# Patient Record
Sex: Female | Born: 1960
Health system: Southern US, Community
[De-identification: ages and names within clinical notes are randomized; demographics above are authoritative.]

## PROBLEM LIST (undated history)

## (undated) DIAGNOSIS — I10 Essential (primary) hypertension: Secondary | ICD-10-CM

## (undated) DIAGNOSIS — G4730 Sleep apnea, unspecified: Secondary | ICD-10-CM

## (undated) DIAGNOSIS — F329 Major depressive disorder, single episode, unspecified: Secondary | ICD-10-CM

## (undated) DIAGNOSIS — E119 Type 2 diabetes mellitus without complications: Secondary | ICD-10-CM

## (undated) DIAGNOSIS — F32A Depression, unspecified: Secondary | ICD-10-CM

## (undated) DIAGNOSIS — F419 Anxiety disorder, unspecified: Secondary | ICD-10-CM

## (undated) HISTORY — DX: Major depressive disorder, single episode, unspecified: F32.9

## (undated) HISTORY — DX: Type 2 diabetes mellitus without complications: E11.9

## (undated) HISTORY — DX: Essential (primary) hypertension: I10

## (undated) HISTORY — PX: KNEE SURGERY: SHX244

## (undated) HISTORY — DX: Anxiety disorder, unspecified: F41.9

## (undated) HISTORY — PX: ECTOPIC PREGNANCY SURGERY: SHX613

## (undated) HISTORY — DX: Depression, unspecified: F32.A

---

## 1994-07-02 HISTORY — PX: BACK SURGERY: SHX140

## 2007-07-03 HISTORY — PX: BREAST BIOPSY: SHX20

## 2011-05-21 LAB — HM COLONOSCOPY

## 2012-07-02 HISTORY — PX: ESOPHAGEAL DILATION: SHX303

## 2014-07-02 DIAGNOSIS — E1159 Type 2 diabetes mellitus with other circulatory complications: Secondary | ICD-10-CM | POA: Insufficient documentation

## 2014-07-02 DIAGNOSIS — I152 Hypertension secondary to endocrine disorders: Secondary | ICD-10-CM | POA: Insufficient documentation

## 2014-07-02 DIAGNOSIS — E119 Type 2 diabetes mellitus without complications: Secondary | ICD-10-CM

## 2014-07-02 DIAGNOSIS — I10 Essential (primary) hypertension: Secondary | ICD-10-CM

## 2014-07-02 HISTORY — DX: Essential (primary) hypertension: I10

## 2014-07-02 HISTORY — DX: Type 2 diabetes mellitus without complications: E11.9

## 2014-11-08 LAB — HM DIABETES EYE EXAM

## 2015-03-22 LAB — HM PAP SMEAR: HM Pap smear: NEGATIVE

## 2016-05-16 LAB — HM MAMMOGRAPHY

## 2016-06-07 LAB — TSH: TSH: 2.32 (ref <=5.90)

## 2017-02-28 ENCOUNTER — Encounter: Payer: Self-pay | Admitting: Family Medicine

## 2017-02-28 ENCOUNTER — Ambulatory Visit (INDEPENDENT_AMBULATORY_CARE_PROVIDER_SITE_OTHER): Payer: 59 | Admitting: Family Medicine

## 2017-02-28 VITALS — BP 122/78 | HR 72 | Temp 98.2°F | Resp 16 | Ht 67.5 in | Wt 246.0 lb

## 2017-02-28 DIAGNOSIS — E1142 Type 2 diabetes mellitus with diabetic polyneuropathy: Secondary | ICD-10-CM | POA: Diagnosis not present

## 2017-02-28 DIAGNOSIS — E669 Obesity, unspecified: Secondary | ICD-10-CM | POA: Diagnosis not present

## 2017-02-28 DIAGNOSIS — Z1159 Encounter for screening for other viral diseases: Secondary | ICD-10-CM

## 2017-02-28 DIAGNOSIS — F419 Anxiety disorder, unspecified: Secondary | ICD-10-CM | POA: Diagnosis not present

## 2017-02-28 DIAGNOSIS — F32A Depression, unspecified: Secondary | ICD-10-CM | POA: Insufficient documentation

## 2017-02-28 DIAGNOSIS — Z0000 Encounter for general adult medical examination without abnormal findings: Secondary | ICD-10-CM | POA: Diagnosis not present

## 2017-02-28 DIAGNOSIS — E049 Nontoxic goiter, unspecified: Secondary | ICD-10-CM | POA: Diagnosis not present

## 2017-02-28 DIAGNOSIS — Z114 Encounter for screening for human immunodeficiency virus [HIV]: Secondary | ICD-10-CM

## 2017-02-28 DIAGNOSIS — F332 Major depressive disorder, recurrent severe without psychotic features: Secondary | ICD-10-CM | POA: Insufficient documentation

## 2017-02-28 DIAGNOSIS — Z124 Encounter for screening for malignant neoplasm of cervix: Secondary | ICD-10-CM

## 2017-02-28 DIAGNOSIS — F339 Major depressive disorder, recurrent, unspecified: Secondary | ICD-10-CM | POA: Diagnosis not present

## 2017-02-28 DIAGNOSIS — F329 Major depressive disorder, single episode, unspecified: Secondary | ICD-10-CM | POA: Insufficient documentation

## 2017-02-28 DIAGNOSIS — Z13228 Encounter for screening for other metabolic disorders: Secondary | ICD-10-CM | POA: Insufficient documentation

## 2017-02-28 DIAGNOSIS — E041 Nontoxic single thyroid nodule: Secondary | ICD-10-CM | POA: Insufficient documentation

## 2017-02-28 DIAGNOSIS — G4733 Obstructive sleep apnea (adult) (pediatric): Secondary | ICD-10-CM

## 2017-02-28 DIAGNOSIS — Z23 Encounter for immunization: Secondary | ICD-10-CM | POA: Diagnosis not present

## 2017-02-28 DIAGNOSIS — Z6837 Body mass index (BMI) 37.0-37.9, adult: Secondary | ICD-10-CM

## 2017-02-28 DIAGNOSIS — I10 Essential (primary) hypertension: Secondary | ICD-10-CM

## 2017-02-28 DIAGNOSIS — F411 Generalized anxiety disorder: Secondary | ICD-10-CM | POA: Insufficient documentation

## 2017-02-28 MED ORDER — ESCITALOPRAM OXALATE 5 MG PO TABS: 5.0000 mg | ORAL_TABLET | Freq: Every day | ORAL | 1 refills | Status: DC

## 2017-02-28 MED ORDER — VENLAFAXINE HCL 50 MG PO TABS: 50.0000 mg | ORAL_TABLET | Freq: Two times a day (BID) | ORAL | 0 refills | Status: DC

## 2017-02-28 NOTE — Assessment & Plan Note (Signed)
Requesting records of mammogram and colonoscopy Pap smear collected today Pneumovax and flu shots today Screen Hep C and HIV Screening CBC Next CPE in 1 yr

## 2017-02-28 NOTE — Assessment & Plan Note (Signed)
Seemingly unctrolled on Effexor currently with some depressive anhedonia Offered increased dose vs switching back to lexapro that had helped in the past Patient prefers lexapro Will cross taper (instructions in AVS) Advised on return precautions including signs of serotonin syndrome F/u in 1 month - repeat GAD7 and PHQ9

## 2017-02-28 NOTE — Assessment & Plan Note (Signed)
Discussed healthy diet and exercise Screening lipid panel

## 2017-02-28 NOTE — Assessment & Plan Note (Signed)
See plan for anxiety below

## 2017-02-28 NOTE — Patient Instructions (Signed)
Decrease Effexor to '50mg'$  daily x1 week and then 25 mg daily for 2 weeks and then stop.  Start lexapro '5mg'$  for 1 week and then increase to 10 mg.  We will follow-up in 1 month and consider increasing dose further.   Preventive Care 40-64 Years, Female Preventive care refers to lifestyle choices and visits with your health care provider that can promote health and wellness. What does preventive care include?  A yearly physical exam. This is also called an annual well check.  Dental exams once or twice a year.  Routine eye exams. Ask your health care provider how often you should have your eyes checked.  Personal lifestyle choices, including: ? Daily care of your teeth and gums. ? Regular physical activity. ? Eating a healthy diet. ? Avoiding tobacco and drug use. ? Limiting alcohol use. ? Practicing safe sex. ? Taking low-dose aspirin daily starting at age 19. ? Taking vitamin and mineral supplements as recommended by your health care provider. What happens during an annual well check? The services and screenings done by your health care provider during your annual well check will depend on your age, overall health, lifestyle risk factors, and family history of disease. Counseling Your health care provider may ask you questions about your:  Alcohol use.  Tobacco use.  Drug use.  Emotional well-being.  Home and relationship well-being.  Sexual activity.  Eating habits.  Work and work Statistician.  Method of birth control.  Menstrual cycle.  Pregnancy history.  Screening You may have the following tests or measurements:  Height, weight, and BMI.  Blood pressure.  Lipid and cholesterol levels. These may be checked every 5 years, or more frequently if you are over 17 years old.  Skin check.  Lung cancer screening. You may have this screening every year starting at age 89 if you have a 30-pack-year history of smoking and currently smoke or have quit within the  past 15 years.  Fecal occult blood test (FOBT) of the stool. You may have this test every year starting at age 70.  Flexible sigmoidoscopy or colonoscopy. You may have a sigmoidoscopy every 5 years or a colonoscopy every 10 years starting at age 21.  Hepatitis C blood test.  Hepatitis B blood test.  Sexually transmitted disease (STD) testing.  Diabetes screening. This is done by checking your blood sugar (glucose) after you have not eaten for a while (fasting). You may have this done every 1-3 years.  Mammogram. This may be done every 1-2 years. Talk to your health care provider about when you should start having regular mammograms. This may depend on whether you have a family history of breast cancer.  BRCA-related cancer screening. This may be done if you have a family history of breast, ovarian, tubal, or peritoneal cancers.  Pelvic exam and Pap test. This may be done every 3 years starting at age 2. Starting at age 16, this may be done every 5 years if you have a Pap test in combination with an HPV test.  Bone density scan. This is done to screen for osteoporosis. You may have this scan if you are at high risk for osteoporosis.  Discuss your test results, treatment options, and if necessary, the need for more tests with your health care provider. Vaccines Your health care provider may recommend certain vaccines, such as:  Influenza vaccine. This is recommended every year.  Tetanus, diphtheria, and acellular pertussis (Tdap, Td) vaccine. You may need a Td booster every 10  years.  Varicella vaccine. You may need this if you have not been vaccinated.  Zoster vaccine. You may need this after age 66.  Measles, mumps, and rubella (MMR) vaccine. You may need at least one dose of MMR if you were born in 1957 or later. You may also need a second dose.  Pneumococcal 13-valent conjugate (PCV13) vaccine. You may need this if you have certain conditions and were not previously  vaccinated.  Pneumococcal polysaccharide (PPSV23) vaccine. You may need one or two doses if you smoke cigarettes or if you have certain conditions.  Meningococcal vaccine. You may need this if you have certain conditions.  Hepatitis A vaccine. You may need this if you have certain conditions or if you travel or work in places where you may be exposed to hepatitis A.  Hepatitis B vaccine. You may need this if you have certain conditions or if you travel or work in places where you may be exposed to hepatitis B.  Haemophilus influenzae type b (Hib) vaccine. You may need this if you have certain conditions.  Talk to your health care provider about which screenings and vaccines you need and how often you need them. This information is not intended to replace advice given to you by your health care provider. Make sure you discuss any questions you have with your health care provider. Document Released: 07/15/2015 Document Revised: 03/07/2016 Document Reviewed: 04/19/2015 Elsevier Interactive Patient Education  2017 [employer].

## 2017-02-28 NOTE — Assessment & Plan Note (Signed)
Continue CPAP.  

## 2017-02-28 NOTE — Progress Notes (Signed)
Patient: Angelica Medina, Female    DOB: May 02, 1961, 56 y.o.   MRN: [id number] Visit Date: 02/28/2017  Today's Provider: Lavon Paganini, MD   Chief Complaint  Patient presents with  . Establish Care  . Annual Exam   Subjective:  Angelica Medina presents to establish care. She just from [place]. She has a H/O "diet controlled" diabetes and HTN. She is taking Lisinopril 5 mg, as well as Effexor 75 mg BID for anxiety and depression. She is also using a CPAP for sleep apnea and has good control of symptoms.  T2DM with neuropathy:  Unknown last A1c - last eye exam in 03/2016 in [place] - taking b complex vitamins for neuropathy - performs nightly foot exam - low carb diet - no regular exercise  HTN: no SEs, taking lisinopril, no cough, no CP, SOB, headaches, vision changes  Anxiety/Depression: previosuly tried Lexapro (thought she was well controlled on this and not sure why she was switched to Effexor), wellbutrin (increased anxiety too much), paxil (fatigue and drowsiness) - a lot of family members also struggle with anxiety - feels as though she does not get pleasure from things she used to enjoy - no SI/HI  Enlarged thyroid: Previously had [place] that was normal - was getting TSH q6 months to monitor - not taking any medications    Annual physical exam Angelica Medina is a 56 y.o. female who presents today for health maintenance and complete physical. She feels well. She reports she is not currently exercising. She reports she is sleeping well.  Pt states her last mammogram was in February.  Previously had normal breast biopsy She agrees to get a pap today. Thinks it was mildly abnormal many years ago, no colpo or biopsy done She states she is UTD on colonoscopy. Never abnormal, thinks last was 3 yrs ago  -----------------------------------------------------------------   Review of Systems  Constitutional: Negative.   HENT: Positive for trouble swallowing. Negative for congestion, dental  problem, drooling, ear discharge, ear pain, facial swelling, hearing loss, mouth sores, nosebleeds, postnasal drip, rhinorrhea, sinus pain, sinus pressure, sneezing, sore throat, tinnitus and voice change.   Eyes: Negative.   Respiratory: Positive for apnea. Negative for cough, choking, chest tightness, shortness of breath, wheezing and stridor.   Cardiovascular: Negative.   Gastrointestinal: Negative.   Endocrine: Negative.   Genitourinary: Positive for dyspareunia and frequency. Negative for decreased urine volume, difficulty urinating, dysuria, enuresis, flank pain, genital sores, hematuria, menstrual problem, pelvic pain, urgency, vaginal bleeding, vaginal discharge and vaginal pain.  Musculoskeletal: Positive for back pain. Negative for arthralgias, gait problem, joint swelling, myalgias, neck pain and neck stiffness.  Skin: Negative.   Allergic/Immunologic: Negative.   Neurological: Negative.   Hematological: Negative.   Psychiatric/Behavioral: Negative for agitation, behavioral problems, confusion, decreased concentration, dysphoric mood, hallucinations, self-injury, sleep disturbance and suicidal ideas. The patient is nervous/anxious. The patient is not hyperactive.     Social History      She  reports that she quit smoking about 22 years ago. She has a 10.00 pack-year smoking history. She has never used smokeless tobacco. She reports that she does not drink alcohol or use drugs.       Social History   Social History  . Marital status: Married    Spouse name: [name]  . Number of children: 3  . Years of education: 12   Occupational History  . 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 Smoking status: Former Smoker  Packs/day: 0.50    Years: 20.00    Quit date: 07/01/1994  . Smokeless tobacco: Never Used  . Alcohol use No  . Drug use: No  . Sexual activity: Yes   Other Topics Concern  . None   Social History Narrative  . None    Past Medical  History:  Diagnosis Date  . Anxiety   . Depression   . Hypertension 2016  . T2DM (type 2 diabetes mellitus) ([place]) 2016   diet controlled     Patient Active Problem List   Diagnosis Date Noted  . OSA (obstructive sleep apnea) 02/28/2017  . Obesity 02/28/2017  . Enlarged thyroid 02/28/2017  . Healthcare maintenance 02/28/2017  . Depression   . Anxiety   . T2DM (type 2 diabetes mellitus) ([place]) 07/02/2014  . Hypertension 07/02/2014    Past Surgical History:  Procedure Laterality Date  . [place]   for herniated disc, no hardware  . ECTOPIC PREGNANCY SURGERY    . ESOPHAGEAL DILATION  2014   has had several i nthe past for achalasia    Family History        Family Status  Relation Status  . Mother Alive  . Father Deceased  . Sister Alive  . Brother Alive  . Brother Deceased       accidental  . Sister Alive        Her family history includes Anxiety disorder in her sister; Depression in her father and sister; Diabetes in her father; Healthy in her brother; Hypothyroidism in her sister; Thyroid cancer in her mother.     Allergies  Allergen Reactions  . Wellbutrin [Bupropion] Anxiety     Current Outpatient Prescriptions:  .  b complex vitamins capsule, Take 1 capsule by mouth daily., Disp: , Rfl:  .  lisinopril (PRINIVIL,ZESTRIL) 5 MG tablet, Take 5 mg by mouth daily., Disp: , Rfl:  .  Multiple Vitamin (MULTIVITAMIN) tablet, Take 1 tablet by mouth daily., Disp: , Rfl:  .  Omega-3 1000 MG CAPS, Take 1 tablet by mouth daily., Disp: , Rfl:  .  venlafaxine (EFFEXOR) 50 MG tablet, Take 1 tablet (50 mg total) by mouth 2 (two) times daily with a meal. Taper as directed, Disp: 60 tablet, Rfl: 0 .  escitalopram (LEXAPRO) 5 MG tablet, Take 1 tablet (5 mg total) by mouth daily. X1wk, then 10mg  daily, Disp: 60 tablet, Rfl: 1   Patient Care Team: Virginia Crews, MD as PCP - General (Family Medicine)      Objective:   Vitals: BP 122/78 (BP Location: Left Arm,  Patient Position: Sitting, Cuff Size: Large)   Pulse 72   Temp 98.2 F (36.8 C) (Oral)   Resp 16   Ht 5' 7.5" (1.715 m)   Wt 246 lb (111.6 kg)   BMI 37.96 kg/m    Vitals:   02/28/17 1017  BP: 122/78  Pulse: 72  Resp: 16  Temp: 98.2 F (36.8 C)  TempSrc: Oral  Weight: 246 lb (111.6 kg)  Height: 5' 7.5" (1.715 m)     Physical Exam  Constitutional: She is oriented to person, place, and time. She appears well-developed and well-nourished. No distress.  HENT:  Head: Normocephalic and atraumatic.  Right Ear: Tympanic membrane and external ear normal.  Left Ear: Tympanic membrane and external ear normal.  Nose: Nose normal.  Mouth/Throat: Oropharynx is clear and moist.  Eyes: Pupils are equal, round, and reactive to light. Conjunctivae and EOM are normal. No scleral  icterus.  Neck: Neck supple. Thyromegaly present.  Cardiovascular: Normal rate, regular rhythm, normal heart sounds and intact distal pulses.   No murmur heard. Pulmonary/Chest: Effort normal and breath sounds normal. No respiratory distress. She has no wheezes. She has no rales.  Abdominal: Soft. Bowel sounds are normal. She exhibits no distension. There is no tenderness. There is no rebound and no guarding.  Genitourinary:  Genitourinary Comments: GYN:  External genitalia within normal limits.  Vaginal mucosa pink, moist, normal rugae.  Nonfriable cervix without lesions, no discharge or bleeding noted on speculum exam.  Bimanual exam revealed normal, nongravid uterus.  No cervical motion tenderness. No adnexal masses bilaterally.    Musculoskeletal: She exhibits no edema or deformity.  Lymphadenopathy:    She has no cervical adenopathy.  Neurological: She is alert and oriented to person, place, and time. No cranial nerve deficit.  Skin: Skin is warm and dry. No rash noted.  Psychiatric: She has a normal mood and affect. Her behavior is normal.  Vitals reviewed.   Diabetic Foot Exam - Simple   Simple Foot  Form Diabetic Foot exam was performed with the following findings:  Yes 02/28/2017 11:39 AM  Visual Inspection No deformities, no ulcerations, no other skin breakdown bilaterally:  Yes Sensation Testing See comments:  Yes Pulse Check Posterior Tibialis and Dorsalis pulse intact bilaterally:  Yes Comments Large calluses on balls of feet b/l Decreased sensation over callused areas     Depression Screen PHQ 2/9 Scores 02/28/2017 02/28/2017  PHQ - 2 Score 2 2  PHQ- 9 Score 4 -   GAD 7 : Generalized Anxiety Score 02/28/2017  Nervous, Anxious, on Edge 1  Control/stop worrying 1  Worry too much - different things 1  Trouble relaxing 1  Restless 0  Easily annoyed or irritable 1  Afraid - awful might happen 1  Total GAD 7 Score 6  Anxiety Difficulty Somewhat difficult     Assessment & Plan:     Routine Health Maintenance and Physical Exam  Exercise Activities and Dietary recommendations Goals    None      Immunization History  Administered Date(s) Administered  . Influenza,inj,Quad PF,6+ Mos 02/28/2017  . Pneumococcal Polysaccharide-23 02/28/2017    Health Maintenance  Topic Date Due  . HEMOGLOBIN A1C  17-Nov-1960  . Hepatitis C Screening  11-Dec-1960  . PNEUMOCOCCAL POLYSACCHARIDE VACCINE (1) 08/18/1962  . FOOT EXAM  08/18/1970  . OPHTHALMOLOGY EXAM  08/18/1970  . HIV Screening  08/19/1975  . TETANUS/TDAP  08/19/1979  . PAP SMEAR  08/18/1981  . MAMMOGRAM  08/18/2010  . COLONOSCOPY  08/18/2010  . INFLUENZA VACCINE  01/30/2017     Discussed health benefits of physical activity, and encouraged her to engage in regular exercise appropriate for her age and condition.   Will request records from previous PCP  Healthcare maintenance Requesting records of mammogram and colonoscopy Pap smear collected today Pneumovax and flu shots today Screen Hep C and HIV Screening CBC Next CPE in 1 yr  Obesity Discussed healthy diet and exercise Screening lipid  panel  Enlarged thyroid Recheck TSH  T2DM (type 2 diabetes mellitus) ([place]) Recheck A1c Foot exam today Referral to optho for eye exam Vaccines as above  OSA (obstructive sleep apnea) Continue CPAP  Hypertension Well controlled Continue lisinopril Check CMP F/u in 3-6 months  Depression See plan for anxiety below  Anxiety Seemingly unctrolled on Effexor currently with some depressive anhedonia Offered increased dose vs switching back to lexapro that had helped in  the past Patient prefers lexapro Will cross taper (instructions in AVS) Advised on return precautions including signs of serotonin syndrome F/u in 1 month - repeat GAD7 and PHQ9    --------------------------------------------------------------------  The entirety of the information documented in the History of Present Illness, Review of Systems and Physical Exam were personally obtained by me. Portions of this information were initially documented by Raquel Sarna Ratchford, CMA and reviewed by me for thoroughness and accuracy.    Lavon Paganini, MD  [facility]

## 2017-02-28 NOTE — Assessment & Plan Note (Signed)
Well controlled Continue lisinopril Check CMP F/u in 3-6 months

## 2017-02-28 NOTE — Assessment & Plan Note (Signed)
Recheck TSH 

## 2017-02-28 NOTE — Assessment & Plan Note (Signed)
Recheck A1c Foot exam today Referral to optho for eye exam Vaccines as above

## 2017-03-01 ENCOUNTER — Telehealth: Payer: Self-pay

## 2017-03-01 DIAGNOSIS — E7800 Pure hypercholesterolemia, unspecified: Secondary | ICD-10-CM

## 2017-03-01 LAB — COMPREHENSIVE METABOLIC PANEL
ALT: 38 IU/L — ABNORMAL HIGH (ref 0–32)
AST: 32 IU/L (ref 0–40)
Albumin/Globulin Ratio: 1.3 (ref 1.2–2.2)
Albumin: 4.7 g/dL (ref 3.5–5.5)
Alkaline Phosphatase: 72 IU/L (ref 39–117)
BUN/Creatinine Ratio: 15 (ref 9–23)
BUN: 10 mg/dL (ref 6–24)
Bilirubin Total: 0.3 mg/dL (ref 0.0–1.2)
CO2: 24 mmol/L (ref 20–29)
Calcium: 10.3 mg/dL — ABNORMAL HIGH (ref 8.7–10.2)
Chloride: 97 mmol/L (ref 96–106)
Creatinine, Ser: 0.66 mg/dL (ref 0.57–1.00)
GFR calc Af Amer: 114 mL/min/{1.73_m2} (ref >59)
GFR calc non Af Amer: 99 mL/min/{1.73_m2} (ref >59)
Globulin, Total: 3.6 g/dL (ref 1.5–4.5)
Glucose: 103 mg/dL — ABNORMAL HIGH (ref 65–99)
Potassium: 5.1 mmol/L (ref 3.5–5.2)
Sodium: 137 mmol/L (ref 134–144)
Total Protein: 8.3 g/dL (ref 6.0–8.5)

## 2017-03-01 LAB — CBC WITH DIFFERENTIAL/PLATELET
Basophils Absolute: 0 10*3/uL (ref 0.0–0.2)
Basos: 0 %
EOS (ABSOLUTE): 0.2 10*3/uL (ref 0.0–0.4)
Eos: 2 %
Hematocrit: 40.6 % (ref 34.0–46.6)
Hemoglobin: 13.3 g/dL (ref 11.1–15.9)
Immature Grans (Abs): 0 10*3/uL (ref 0.0–0.1)
Immature Granulocytes: 0 %
Lymphocytes Absolute: 3.1 10*3/uL (ref 0.7–3.1)
Lymphs: 36 %
MCH: 28.9 pg (ref 26.6–33.0)
MCHC: 32.8 g/dL (ref 31.5–35.7)
MCV: 88 fL (ref 79–97)
Monocytes Absolute: 0.4 10*3/uL (ref 0.1–0.9)
Monocytes: 5 %
Neutrophils Absolute: 4.9 10*3/uL (ref 1.4–7.0)
Neutrophils: 57 %
Platelets: 394 10*3/uL — ABNORMAL HIGH (ref 150–379)
RBC: 4.61 x10E6/uL (ref 3.77–5.28)
RDW: 13.6 % (ref 12.3–15.4)
WBC: 8.6 10*3/uL (ref 3.4–10.8)

## 2017-03-01 LAB — HEPATITIS C ANTIBODY: Hep C Virus Ab: <0.1 s/co ratio (ref 0.0–0.9)

## 2017-03-01 LAB — LIPID PANEL
Chol/HDL Ratio: 4.6 ratio — ABNORMAL HIGH (ref 0.0–4.4)
Cholesterol, Total: 219 mg/dL — ABNORMAL HIGH (ref 100–199)
HDL: 48 mg/dL (ref >39)
LDL Calculated: 140 mg/dL — ABNORMAL HIGH (ref 0–99)
Triglycerides: 156 mg/dL — ABNORMAL HIGH (ref 0–149)
VLDL Cholesterol Cal: 31 mg/dL (ref 5–40)

## 2017-03-01 LAB — HEMOGLOBIN A1C
Est. average glucose Bld gHb Est-mCnc: 148 mg/dL
Hgb A1c MFr Bld: 6.8 % — ABNORMAL HIGH (ref 4.8–5.6)

## 2017-03-01 LAB — TSH: TSH: 1.42 u[IU]/mL (ref 0.450–4.500)

## 2017-03-01 LAB — HIV ANTIBODY (ROUTINE TESTING W REFLEX): HIV Screen 4th Generation wRfx: NONREACTIVE

## 2017-03-01 NOTE — Telephone Encounter (Signed)
lmtcb

## 2017-03-01 NOTE — Telephone Encounter (Signed)
-----   Message from Virginia Crews, MD sent at 03/01/2017  8:31 AM EDT ----- Normal Thyroid function, blood counts (platelets run just slightly above normal range, nothing to worry about), kidney function, liver function, electrolytes (calcium is slightly elevated - 10.3, 10.2 is normal, could decrease any calcium supplement you are taking).  Cholesterol is high.  10 year risk of heart disease/stroke is 6.1%.  I would recommend starting a low dose statin medicatio nto lower cholesterol (Lipitor 20mg  daily). Will wait to hear back from the patient on whether or not she'd like to do this.  A1c 6.8, which is below 7 which is goal.  Negative HIV and Hep C screening.  Virginia Crews, MD, MPH [facility] 03/01/2017 8:31 AM

## 2017-03-02 LAB — PAP IG AND HPV HIGH-RISK
HPV, high-risk: NEGATIVE
PAP Smear Comment: 0

## 2017-03-05 MED ORDER — ATORVASTATIN CALCIUM 20 MG PO TABS: 20.0000 mg | ORAL_TABLET | Freq: Every day | ORAL | 2 refills | Status: DC

## 2017-03-05 NOTE — Telephone Encounter (Signed)
Pt advised of lab/pap results. She agrees to start Lipitor. [place]. How many refills should I send in?

## 2017-03-05 NOTE — Telephone Encounter (Signed)
Sent in

## 2017-03-05 NOTE — Telephone Encounter (Signed)
Lipitor 20 mg daily #90 R-2

## 2017-03-05 NOTE — Telephone Encounter (Signed)
-----   Message from Virginia Crews, MD sent at 03/05/2017  8:12 AM EDT ----- Normal pap smear.  Repeat in 5 years.  Virginia Crews, MD, MPH [facility] 03/05/2017 8:12 AM

## 2017-03-22 ENCOUNTER — Encounter: Payer: Self-pay | Admitting: Family Medicine

## 2017-03-22 NOTE — Progress Notes (Signed)
Received records from former PCP office.  Review reveals the following:  - 06/2016 found to have small benign nodule of left lobe of thyroid gland - recommended repeat US in 1 yr  - seems patient was weaned from [place] and started on Effexor for worsening depressive symptoms - had been on Effexor prior to Lexapro  - Pap smear from 08/2014 normal, no HPV testing  - Most recent diabetic eye exam in 10/2014 with no retinopathy  - overnight oximetry in 2013 with # desat events per hour low at 4.6 - no CPAP or O2 recommended - then patient was started on CPAP with improvements in symptoms  - diagnosed in early 2010s with dysphagia likely 2/2 achlasia - has had multiple esophageal dilations for symptom control  Vital records scanned into chart.  Virginia Crews, MD, MPH [facility] 03/22/2017 3:53 PM

## 2017-03-27 ENCOUNTER — Encounter: Payer: Self-pay | Admitting: Family Medicine

## 2017-03-29 ENCOUNTER — Ambulatory Visit (INDEPENDENT_AMBULATORY_CARE_PROVIDER_SITE_OTHER): Payer: 59 | Admitting: Family Medicine

## 2017-03-29 ENCOUNTER — Encounter: Payer: Self-pay | Admitting: Family Medicine

## 2017-03-29 VITALS — BP 132/78 | HR 76 | Temp 98.2°F | Resp 16 | Wt 246.0 lb

## 2017-03-29 DIAGNOSIS — R319 Hematuria, unspecified: Secondary | ICD-10-CM | POA: Diagnosis not present

## 2017-03-29 DIAGNOSIS — F331 Major depressive disorder, recurrent, moderate: Secondary | ICD-10-CM

## 2017-03-29 DIAGNOSIS — R350 Frequency of micturition: Secondary | ICD-10-CM | POA: Diagnosis not present

## 2017-03-29 DIAGNOSIS — F419 Anxiety disorder, unspecified: Secondary | ICD-10-CM

## 2017-03-29 LAB — POCT URINALYSIS DIPSTICK
Bilirubin, UA: NEGATIVE
Glucose, UA: NEGATIVE
Ketones, UA: NEGATIVE
Leukocytes, UA: NEGATIVE
Nitrite, UA: NEGATIVE
Protein, UA: NEGATIVE
Spec Grav, UA: 1.025 (ref 1.010–1.025)
Urobilinogen, UA: 0.2 E.U./dL
pH, UA: 6 (ref 5.0–8.0)

## 2017-03-29 MED ORDER — ESCITALOPRAM OXALATE 20 MG PO TABS: 20.0000 mg | ORAL_TABLET | Freq: Every day | ORAL | 3 refills | Status: DC

## 2017-03-29 NOTE — Patient Instructions (Signed)

## 2017-03-29 NOTE — Assessment & Plan Note (Signed)
Increase lexapro to 20 mg daily Improving but not well controlled Off of Effexor F/u in 3 months - repeat GAD7 and PHQ9

## 2017-03-29 NOTE — Assessment & Plan Note (Signed)
Improving, but not well controlled Increase lexapro dose to 20mg  daily No SI/HI F/u in 3 months Consider addition of therapy

## 2017-03-29 NOTE — Progress Notes (Signed)
Patient: Angelica Medina Female    DOB: 21-Nov-1960   56 y.o.   MRN: [id number] Visit Date: 03/29/2017  Today's Provider: Lavon Paganini, MD   Chief Complaint  Patient presents with  . Anxiety  . Urinary Tract Infection   Subjective:    Anxiety  Presents for follow-up (LOV 02/28/2017; pt D/C Effexor and started Lexapro 10 mg) visit. Symptoms include irritability, nervous/anxious behavior and restlessness. Patient reports no chest pain, compulsions, confusion, decreased concentration, depressed mood (improved on Lexapro), dizziness, dry mouth, excessive worry, feeling of choking, hyperventilation, impotence, insomnia, malaise, muscle tension, nausea, obsessions, palpitations, panic, shortness of breath or suicidal ideas. The severity of symptoms is moderate. The quality of sleep is fair.   Compliance with medications: good compliance. Treatment side effects: no side effects.  Urinary Tract Infection   This is a new problem. Episode onset: x 5 days after driving home from [place], without stopping enough times to urinate. The problem has been unchanged. The patient is experiencing no pain ("just pressure"). Associated symptoms include frequency and urgency. Pertinent negatives include no chills, discharge, flank pain, hematuria, hesitancy, nausea, sweats or vomiting. She has tried NSAIDs for the symptoms. The treatment provided mild relief.      Allergies  Allergen Reactions  . Wellbutrin [Bupropion] Anxiety     Current Outpatient Prescriptions:  .  atorvastatin (LIPITOR) 20 MG tablet, Take 1 tablet (20 mg total) by mouth daily., Disp: 90 tablet, Rfl: 2 .  b complex vitamins capsule, Take 1 capsule by mouth daily., Disp: , Rfl:  .  escitalopram (LEXAPRO) 20 MG tablet, Take 1 tablet (20 mg total) by mouth daily., Disp: 90 tablet, Rfl: 3 .  lisinopril (PRINIVIL,ZESTRIL) 5 MG tablet, Take 5 mg by mouth daily., Disp: , Rfl:  .  Multiple Vitamin (MULTIVITAMIN) tablet, Take 1 tablet by mouth  daily., Disp: , Rfl:  .  Omega-3 1000 MG CAPS, Take 1 tablet by mouth daily., Disp: , Rfl:   Review of Systems  Constitutional: Positive for irritability. Negative for chills.  Respiratory: Negative for shortness of breath.   Cardiovascular: Negative for chest pain and palpitations.  Gastrointestinal: Negative for nausea and vomiting.  Genitourinary: Positive for frequency and urgency. Negative for flank pain, hematuria, hesitancy and impotence.  Neurological: Negative for dizziness.  Psychiatric/Behavioral: Negative for confusion, decreased concentration and suicidal ideas. The patient is nervous/anxious. The patient does not have insomnia.     Social History  Substance Use Topics  . Smoking status: Former Smoker    Packs/day: 0.50    Years: 20.00    Quit date: 07/01/1994  . Smokeless tobacco: Never Used  . Alcohol use No   Objective:   BP 132/78 (BP Location: Left Arm, Patient Position: Sitting, Cuff Size: Large)   Pulse 76   Temp 98.2 F (36.8 C) (Oral)   Resp 16   Wt 246 lb (111.6 kg)   BMI 37.96 kg/m  Vitals:   03/29/17 0836  BP: 132/78  Pulse: 76  Resp: 16  Temp: 98.2 F (36.8 C)  TempSrc: Oral  Weight: 246 lb (111.6 kg)     Physical Exam  Constitutional: She is oriented to person, place, and time. She appears well-developed and well-nourished. No distress.  HENT:  Head: Normocephalic and atraumatic.  Eyes: Conjunctivae are normal. No scleral icterus.  Cardiovascular: Normal rate, regular rhythm, normal heart sounds and intact distal pulses.   No murmur heard. Pulmonary/Chest: Effort normal and breath sounds normal. No respiratory distress. She has  no wheezes. She has no rales.  Abdominal: Soft. Bowel sounds are normal. She exhibits no distension. There is no tenderness.  Musculoskeletal: She exhibits no edema.  Neurological: She is alert and oriented to person, place, and time.  Skin: Skin is warm and dry. No rash noted.  Psychiatric: She has a normal  mood and affect. Her behavior is normal.  Vitals reviewed.  Depression screen [facility] 2/9 03/29/2017 02/28/2017 02/28/2017  Decreased Interest 1 1 1   Down, Depressed, Hopeless 0 1 1  PHQ - 2 Score 1 2 2   Altered sleeping 1 0 -  Tired, decreased energy 1 1 -  Change in appetite 2 1 -  Feeling bad or failure about yourself  0 - -  Trouble concentrating 0 0 -  Moving slowly or fidgety/restless 0 0 -  Suicidal thoughts 0 0 -  PHQ-9 Score 5 4 -  Difficult doing work/chores Not difficult at all Not difficult at all -   GAD 7 : Generalized Anxiety Score 03/29/2017 02/28/2017  Nervous, Anxious, on Edge 3 1  Control/stop worrying 0 1  Worry too much - different things 0 1  Trouble relaxing 2 1  Restless 1 0  Easily annoyed or irritable 2 1  Afraid - awful might happen 0 1  Total GAD 7 Score 8 6  Anxiety Difficulty - Somewhat difficult    Results for orders placed or performed in visit on 03/29/17  POCT urinalysis dipstick  Result Value Ref Range   Color, UA dark yellow    Clarity, UA clear    Glucose, UA Negative    Bilirubin, UA Negative    Ketones, UA Negative    Spec Grav, UA 1.025 1.010 - 1.025   Blood, UA Hemolyzed Moderate    pH, UA 6.0 5.0 - 8.0   Protein, UA Negative    Urobilinogen, UA 0.2 0.2 or 1.0 E.U./dL   Nitrite, UA Negative    Leukocytes, UA Negative Negative        Assessment & Plan:      Problem List Items Addressed This Visit      Other   Depression    Improving, but not well controlled Increase lexapro dose to 20mg  daily No SI/HI F/u in 3 months Consider addition of therapy      Relevant Medications   escitalopram (LEXAPRO) 20 MG tablet   Anxiety    Increase lexapro to 20 mg daily Improving but not well controlled Off of Effexor F/u in 3 months - repeat GAD7 and PHQ9      Relevant Medications   escitalopram (LEXAPRO) 20 MG tablet   Hematuria - Primary    UA reveals mod blood with neg leuks/nitrites Suspect dysuria may be related to  dehydration and bladder distension from long trip recently Will send urine for culture and micro No antibiotics currently but pending UCx results Patient reports previous hematuria evaluated by urologist with benign findings - not found in previous PCP records May need urology referral pending micro results - would first recheck in ~6 weeks to see that this is persistent      Relevant Orders   Urine Culture   Urine Microscopic    Other Visit Diagnoses    Urinary frequency       Relevant Orders   POCT urinalysis dipstick (Completed)          The entirety of the information documented in the History of Present Illness, Review of Systems and Physical Exam were personally obtained by  me. Portions of this information were initially documented by Raquel Sarna Ratchford, CMA and reviewed by me for thoroughness and accuracy.     Lavon Paganini, MD  [facility]

## 2017-03-29 NOTE — Assessment & Plan Note (Signed)
UA reveals mod blood with neg leuks/nitrites Suspect dysuria may be related to dehydration and bladder distension from long trip recently Will send urine for culture and micro No antibiotics currently but pending UCx results Patient reports previous hematuria evaluated by urologist with benign findings - not found in previous PCP records May need urology referral pending micro results - would first recheck in ~6 weeks to see that this is persistent

## 2017-03-30 LAB — URINALYSIS, MICROSCOPIC ONLY
Bacteria, UA: NONE SEEN /HPF
Hyaline Cast: NONE SEEN /LPF

## 2017-04-01 ENCOUNTER — Telehealth: Payer: Self-pay

## 2017-04-01 LAB — URINE CULTURE
MICRO NUMBER:: 81079366
SPECIMEN QUALITY:: ADEQUATE

## 2017-04-01 NOTE — Telephone Encounter (Signed)
-----   Message from Virginia Crews, MD sent at 04/01/2017  8:25 AM EDT ----- Despite urinalysis being have no signs of infection and only some blood, urine culture did grow a bacteria.  We will treat if patient is still having symptoms.  Would Rx Bactrim DS 1 tab BID x3 days.  Please check with patient  Bacigalupo, Dionne Bucy, MD, MPH [facility] 04/01/2017 8:24 AM

## 2017-04-01 NOTE — Telephone Encounter (Signed)
lmtcb

## 2017-04-02 MED ORDER — SULFAMETHOXAZOLE-TRIMETHOPRIM 800-160 MG PO TABS: 1.0000 | ORAL_TABLET | Freq: Two times a day (BID) | ORAL | 0 refills | Status: DC

## 2017-04-02 NOTE — Telephone Encounter (Signed)
Left message to call back  

## 2017-04-02 NOTE — Telephone Encounter (Signed)
Pt advised and abx sent in.

## 2017-04-18 ENCOUNTER — Telehealth: Payer: Self-pay | Admitting: Family Medicine

## 2017-04-18 NOTE — Telephone Encounter (Signed)
ROI ([facility]) faxed to Dr. Renaye Rakers  For records.

## 2017-05-02 LAB — HM DIABETES EYE EXAM

## 2017-05-03 ENCOUNTER — Encounter: Payer: Self-pay | Admitting: Family Medicine

## 2017-06-28 ENCOUNTER — Ambulatory Visit: Payer: 59 | Admitting: Family Medicine

## 2017-06-28 ENCOUNTER — Encounter: Payer: Self-pay | Admitting: Family Medicine

## 2017-06-28 VITALS — BP 110/72 | HR 67 | Temp 98.2°F | Resp 16 | Wt 250.0 lb

## 2017-06-28 DIAGNOSIS — E1140 Type 2 diabetes mellitus with diabetic neuropathy, unspecified: Secondary | ICD-10-CM | POA: Insufficient documentation

## 2017-06-28 DIAGNOSIS — E1142 Type 2 diabetes mellitus with diabetic polyneuropathy: Secondary | ICD-10-CM

## 2017-06-28 DIAGNOSIS — E782 Mixed hyperlipidemia: Secondary | ICD-10-CM | POA: Diagnosis not present

## 2017-06-28 DIAGNOSIS — Z1231 Encounter for screening mammogram for malignant neoplasm of breast: Secondary | ICD-10-CM

## 2017-06-28 DIAGNOSIS — F419 Anxiety disorder, unspecified: Secondary | ICD-10-CM

## 2017-06-28 DIAGNOSIS — F331 Major depressive disorder, recurrent, moderate: Secondary | ICD-10-CM

## 2017-06-28 DIAGNOSIS — Z1239 Encounter for other screening for malignant neoplasm of breast: Secondary | ICD-10-CM

## 2017-06-28 DIAGNOSIS — E1169 Type 2 diabetes mellitus with other specified complication: Secondary | ICD-10-CM | POA: Insufficient documentation

## 2017-06-28 DIAGNOSIS — E785 Hyperlipidemia, unspecified: Secondary | ICD-10-CM | POA: Insufficient documentation

## 2017-06-28 LAB — POCT GLYCOSYLATED HEMOGLOBIN (HGB A1C)
Est. average glucose Bld gHb Est-mCnc: 160
Hemoglobin A1C: 7.2

## 2017-06-28 LAB — POCT UA - MICROALBUMIN: Microalbumin Ur, POC: 20 mg/L

## 2017-06-28 MED ORDER — METFORMIN HCL 500 MG PO TABS: 500.0000 mg | ORAL_TABLET | Freq: Two times a day (BID) | ORAL | 2 refills | Status: DC

## 2017-06-28 NOTE — Assessment & Plan Note (Signed)
Uncontrolled, but not interested in meds at this time Continue B complex May improve with improvement of blood glucoses Consider gabapentin in the future

## 2017-06-28 NOTE — Assessment & Plan Note (Signed)
Worsened, but patient feels as though symptoms are related to poor glucose control No changes to lexapro at this time F/u in 1 month with GAD7 and reassess

## 2017-06-28 NOTE — Assessment & Plan Note (Signed)
Previously well controlled with diet, but A1c worsening from 6.8 to 7.2 Will start metformin 500mg  BID - discussed possible GI upset Discussed diet and exercise as well Will follow fasting BGs and f/u in 1 month UTD on foot exam, eye exam, vaccines Recheck Cr at f/u visit

## 2017-06-28 NOTE — Patient Instructions (Signed)

## 2017-06-28 NOTE — Assessment & Plan Note (Signed)
Worsened, but patient feels as though symptoms are related to poor glucose control No changes to lexapro at this time F/u in 1 month with PHQ9 and reassess

## 2017-06-28 NOTE — Progress Notes (Signed)
Patient: Angelica Medina Female    DOB: [birth year]   56 y.o.   MRN: [id number] Visit Date: 06/28/2017  Today's Provider: Lavon Paganini, MD   I, Martha Clan, CMA, am acting as scribe for Lavon Paganini, MD.  Chief Complaint  Patient presents with  . Diabetes  . Hyperlipidemia   Subjective:    HPI      Diabetes Mellitus Type II, Follow-up:   Lab Results  Component Value Date   HGBA1C 7.2 06/28/2017   HGBA1C 6.8 (H) 02/28/2017    Last seen for diabetes 3 months ago.  Management since then includes none. She reports good compliance with treatment. She is not having side effects.  Current symptoms include hyperglycemia, nausea, paresthesia of the feet, polyuria and visual disturbances and have been worsening. Home blood sugar records: fasting range: 306-312  Episodes of hypoglycemia? no   Current Insulin Regimen: N/A Most Recent Eye Exam: 05/02/2017- no diabetic retinopathy Weight trend: increasing steadily Prior visit with dietician: yes - "a long time ago" Current diet: in general, an "unhealthy" diet Current exercise: none  Thinks lifestyle is more sedentary since moving here.  Bought a treadmill and doing well walking daily.  Has never taken a medication for diabetes.  States she did not over indulge during the holidays.  States that peripheral neuropathy has been worse recently.  Thinks this may be related to higher blood sugars.  Taking B complex vitamins helps.  Not interested in medication at this time  Pertinent Labs:    Component Value Date/Time   CHOL 219 (H) 02/28/2017 1201   TRIG 156 (H) 02/28/2017 1201   HDL 48 02/28/2017 1201   LDLCALC 140 (H) 02/28/2017 1201   CREATININE 0.66 02/28/2017 1201    Wt Readings from Last 3 Encounters:  06/28/17 250 lb (113.4 kg)  03/29/17 246 lb (111.6 kg)  02/28/17 246 lb (111.6 kg)    ------------------------------------------------------------------------  Lipid/Cholesterol, Follow-up:    Last seen for this 3 months ago.  Management changes since that visit include adding Lipitor 20 mg. . Last Lipid Panel:    Component Value Date/Time   CHOL 219 (H) 02/28/2017 1201   TRIG 156 (H) 02/28/2017 1201   HDL 48 02/28/2017 1201   CHOLHDL 4.6 (H) 02/28/2017 1201   LDLCALC 140 (H) 02/28/2017 1201    Risk factors for vascular disease include diabetes mellitus, hypercholesterolemia and hypertension  She reports excellent compliance with treatment. She is not having side effects.    ------------------------------------------------------------------- Pt's Lexapro was increased to 20 mg on 03/29/2017. Pt reports she she is unsure if her depression is worsening, or if worsening hyperglycemia is causing sx. She is experiencing anxiety, "brain fog", fatigue.  Depression screen [facility] 2/9 06/28/2017 03/29/2017 02/28/2017 02/28/2017  Decreased Interest 0 1 1 1   Down, Depressed, Hopeless 0 0 1 1  PHQ - 2 Score 0 1 2 2   Altered sleeping 3 1 0 -  Tired, decreased energy 3 1 1  -  Change in appetite 2 2 1  -  Feeling bad or failure about yourself  1 0 - -  Trouble concentrating 1 0 0 -  Moving slowly or fidgety/restless 0 0 0 -  Suicidal thoughts 0 0 0 -  PHQ-9 Score 10 5 4  -  Difficult doing work/chores Not difficult at all Not difficult at all Not difficult at all -    GAD 7 : Generalized Anxiety Score 06/28/2017 03/29/2017 02/28/2017  Nervous, Anxious, on Edge 3 3 1  Control/stop worrying 1 0 1  Worry too much - different things 1 0 1  Trouble relaxing 1 2 1   Restless 0 1 0  Easily annoyed or irritable 3 2 1   Afraid - awful might happen 1 0 1  Total GAD 7 Score 10 8 6   Anxiety Difficulty Somewhat difficult - Somewhat difficult      Allergies  Allergen Reactions  . Wellbutrin [Bupropion] Anxiety     Current Outpatient Medications:  .  atorvastatin (LIPITOR) 20 MG tablet, Take 1 tablet (20 mg total) by mouth daily., Disp: 90 tablet, Rfl: 2 .  b complex vitamins capsule,  Take 1 capsule by mouth daily., Disp: , Rfl:  .  escitalopram (LEXAPRO) 20 MG tablet, Take 1 tablet (20 mg total) by mouth daily., Disp: 90 tablet, Rfl: 3 .  lisinopril (PRINIVIL,ZESTRIL) 5 MG tablet, Take 5 mg by mouth daily., Disp: , Rfl:  .  Multiple Vitamin (MULTIVITAMIN) tablet, Take 1 tablet by mouth daily., Disp: , Rfl:  .  Omega-3 1000 MG CAPS, Take 1 tablet by mouth daily., Disp: , Rfl:  .  metFORMIN (GLUCOPHAGE) 500 MG tablet, Take 1 tablet (500 mg total) by mouth 2 (two) times daily with a meal., Disp: 60 tablet, Rfl: 2  Review of Systems  Constitutional: Positive for activity change, appetite change, fatigue and unexpected weight change.  Eyes: Positive for visual disturbance.  Respiratory: Negative for shortness of breath.   Cardiovascular: Negative for chest pain, palpitations and leg swelling.  Endocrine: Positive for polyphagia and polyuria. Negative for polydipsia.  Psychiatric/Behavioral: Positive for decreased concentration. Negative for dysphoric mood. The patient is nervous/anxious.     Social History   Tobacco Use  . Smoking status: Former Smoker    Packs/day: 0.50    Years: 20.00    Pack years: 10.00    Last attempt to quit: 07/01/1994    Years since quitting: 23.0  . Smokeless tobacco: Never Used  Substance Use Topics  . Alcohol use: No   Objective:   BP 110/72 (BP Location: Left Arm, Patient Position: Sitting, Cuff Size: Large)   Pulse 67   Temp 98.2 F (36.8 C) (Oral)   Resp 16   Wt 250 lb (113.4 kg)   SpO2 96%   BMI 38.58 kg/m  Vitals:   06/28/17 0816  BP: 110/72  Pulse: 67  Resp: 16  Temp: 98.2 F (36.8 C)  TempSrc: Oral  SpO2: 96%  Weight: 250 lb (113.4 kg)     Physical Exam  Constitutional: She is oriented to person, place, and time. She appears well-developed and well-nourished. No distress.  HENT:  Head: Normocephalic and atraumatic.  Eyes: Conjunctivae are normal. No scleral icterus.  Cardiovascular: Normal rate, regular  rhythm, normal heart sounds and intact distal pulses.  No murmur heard. Pulmonary/Chest: Effort normal and breath sounds normal. No respiratory distress. She has no wheezes. She has no rales.  Musculoskeletal: She exhibits no edema or deformity.  Neurological: She is alert and oriented to person, place, and time.  Skin: Skin is warm and dry. No rash noted.  Psychiatric: She has a normal mood and affect. Her behavior is normal.  Vitals reviewed.       Assessment & Plan:      Problem List Items Addressed This Visit      Endocrine   T2DM (type 2 diabetes mellitus) ([place]) - Primary    Previously well controlled with diet, but A1c worsening from 6.8 to 7.2 Will start metformin 500mg   BID - discussed possible GI upset Discussed diet and exercise as well Will follow fasting BGs and f/u in 1 month UTD on foot exam, eye exam, vaccines Recheck Cr at f/u visit      Relevant Medications   metFORMIN (GLUCOPHAGE) 500 MG tablet   Other Relevant Orders   POCT UA - Microalbumin (Completed)   POCT glycosylated hemoglobin (Hb A1C) (Completed)   Diabetic neuropathy (H[facility])    Uncontrolled, but not interested in meds at this time Continue B complex May improve with improvement of blood glucoses Consider gabapentin in the future      Relevant Medications   metFORMIN (GLUCOPHAGE) 500 MG tablet     Other   Depression    Worsened, but patient feels as though symptoms are related to poor glucose control No changes to lexapro at this time F/u in 1 month with PHQ9 and reassess      Anxiety    Worsened, but patient feels as though symptoms are related to poor glucose control No changes to lexapro at this time F/u in 1 month with GAD7 and reassess      HLD (hyperlipidemia)    Doing well on Lipitor (new med) Will recheck lipid panel and CMP at f/u visit in 1 month       Other Visit Diagnoses    Screening for breast cancer       Relevant Orders   MM SCREENING BREAST TOMO BILATERAL       Return in about 4 weeks (around 07/26/2017) for diabetes, mood f/u.   The entirety of the information documented in the History of Present Illness, Review of Systems and Physical Exam were personally obtained by me. Portions of this information were initially documented by Raquel Sarna Ratchford, CMA and reviewed by me for thoroughness and accuracy.       Virginia Crews, MD, MPH [facility] 06/28/2017 9:21 AM

## 2017-06-28 NOTE — Assessment & Plan Note (Signed)
Doing well on Lipitor (new med) Will recheck lipid panel and CMP at f/u visit in 1 month

## 2017-07-26 ENCOUNTER — Ambulatory Visit (INDEPENDENT_AMBULATORY_CARE_PROVIDER_SITE_OTHER): Payer: 59 | Admitting: Family Medicine

## 2017-07-26 ENCOUNTER — Encounter: Payer: Self-pay | Admitting: Family Medicine

## 2017-07-26 VITALS — BP 112/72 | HR 67 | Temp 98.4°F | Resp 16 | Wt 240.0 lb

## 2017-07-26 DIAGNOSIS — F419 Anxiety disorder, unspecified: Secondary | ICD-10-CM | POA: Diagnosis not present

## 2017-07-26 DIAGNOSIS — E1142 Type 2 diabetes mellitus with diabetic polyneuropathy: Secondary | ICD-10-CM | POA: Diagnosis not present

## 2017-07-26 DIAGNOSIS — E669 Obesity, unspecified: Secondary | ICD-10-CM | POA: Diagnosis not present

## 2017-07-26 DIAGNOSIS — F331 Major depressive disorder, recurrent, moderate: Secondary | ICD-10-CM | POA: Diagnosis not present

## 2017-07-26 DIAGNOSIS — Z6837 Body mass index (BMI) 37.0-37.9, adult: Secondary | ICD-10-CM

## 2017-07-26 DIAGNOSIS — E782 Mixed hyperlipidemia: Secondary | ICD-10-CM

## 2017-07-26 MED ORDER — METFORMIN HCL 500 MG PO TABS: 500.0000 mg | ORAL_TABLET | Freq: Two times a day (BID) | ORAL | 2 refills | Status: DC

## 2017-07-26 MED ORDER — DULOXETINE HCL 30 MG PO CPEP: ORAL_CAPSULE | ORAL | 1 refills | Status: DC

## 2017-07-26 NOTE — Assessment & Plan Note (Signed)
Doing well on lipitor Recheck lipid panel and CMP to see if LDL has improved

## 2017-07-26 NOTE — Assessment & Plan Note (Signed)
Congratulated patient on 10 lb weight loss over last month Discussed diet in depth Continue walking for exercise

## 2017-07-26 NOTE — Patient Instructions (Signed)

## 2017-07-26 NOTE — Progress Notes (Signed)
Patient: Angelica Medina Female    DOB: 1961/05/23   57 y.o.   MRN: [id number] Visit Date: 07/26/2017  Today's Provider: Lavon Paganini, MD   I, Martha Clan, CMA, am acting as scribe for Lavon Paganini, MD.  Chief Complaint  Patient presents with  . Diabetes  . Depression  . Hyperlipidemia   Subjective:    HPI      Diabetes Mellitus Type II, Follow-up:   Lab Results  Component Value Date   HGBA1C 7.2 06/28/2017   HGBA1C 6.8 (H) 02/28/2017    Last seen for diabetes 1 month ago.  Management since then includes adding Metformin 500 mg BID. She reports good compliance with treatment. She is having side effects. Loose stools, which have improved. Current symptoms include paresthesia of the feet and weight loss and have been stable. Home blood sugar records: fasting range: 108-128  Episodes of hypoglycemia? no   Current Insulin Regimen: N/A Most Recent Eye Exam: 05/02/2017- negative Weight trend: decreasing steadily Prior visit with dietician: no Current diet: well balanced; has been trying to incorporate more green beans and spinach. States "I do not know how to eat for a diabetic, so I avoid fruits". Current exercise: walking up to 2 miles a day. States this has dereased in the last 2-3 weeks due to decreased interest.  Pertinent Labs:    Component Value Date/Time   CHOL 219 (H) 02/28/2017 1201   TRIG 156 (H) 02/28/2017 1201   HDL 48 02/28/2017 1201   LDLCALC 140 (H) 02/28/2017 1201   CREATININE 0.66 02/28/2017 1201    Wt Readings from Last 3 Encounters:  07/26/17 240 lb (108.9 kg)  06/28/17 250 lb (113.4 kg)  03/29/17 246 lb (111.6 kg)    ------------------------------------------------------------------------  Depression, Follow-up  She  was last seen for this 1 month ago. Changes made at last visit include none; this was worsening at the time, but pt believed the sx were secondary to poor glucose control.   Current symptoms include:  anhedonia, depressed mood, fatigue, feelings of worthlessness/guilt and weight loss She feels she is Worse since last visit.  She has previously tried Effexor, zoloft, prozac, and possibly celexa in the past without good symptom control.   Depression screen [facility] 2/9 06/28/2017 03/29/2017 02/28/2017  Decreased Interest 0 1 1  Down, Depressed, Hopeless 0 0 1  PHQ - 2 Score 0 1 2  Altered sleeping 3 1 0  Tired, decreased energy 3 1 1   Change in appetite 2 2 1   Feeling bad or failure about yourself  1 0 -  Trouble concentrating 1 0 0  Moving slowly or fidgety/restless 0 0 0  Suicidal thoughts 0 0 0  PHQ-9 Score 10 5 4   Difficult doing work/chores Not difficult at all Not difficult at all Not difficult at all    GAD 7 : Generalized Anxiety Score 07/26/2017 06/28/2017 03/29/2017 02/28/2017  Nervous, Anxious, on Edge 2 3 3 1   Control/stop worrying 2 1 0 1  Worry too much - different things 2 1 0 1  Trouble relaxing 1 1 2 1   Restless 0 0 1 0  Easily annoyed or irritable 2 3 2 1   Afraid - awful might happen 2 1 0 1  Total GAD 7 Score 11 10 8 6   Anxiety Difficulty Very difficult Somewhat difficult - Somewhat difficult     ------------------------------------------------------------------------ Pt started Lipitor 20 mg po qd on 03/05/2017. She is due for lipid and CMP recheck  today. She reports good compliance with treatment, and good tolerance of medication. She is fasting for blood work.   Allergies  Allergen Reactions  . Wellbutrin [Bupropion] Anxiety     Current Outpatient Medications:  .  atorvastatin (LIPITOR) 20 MG tablet, Take 1 tablet (20 mg total) by mouth daily., Disp: 90 tablet, Rfl: 2 .  b complex vitamins capsule, Take 1 capsule by mouth daily., Disp: , Rfl:  .  escitalopram (LEXAPRO) 20 MG tablet, Take 1 tablet (20 mg total) by mouth daily., Disp: 90 tablet, Rfl: 3 .  lisinopril (PRINIVIL,ZESTRIL) 5 MG tablet, Take 5 mg by mouth daily., Disp: , Rfl:  .  metFORMIN  (GLUCOPHAGE) 500 MG tablet, Take 1 tablet (500 mg total) by mouth 2 (two) times daily with a meal., Disp: 60 tablet, Rfl: 2 .  Multiple Vitamin (MULTIVITAMIN) tablet, Take 1 tablet by mouth daily., Disp: , Rfl:  .  Omega-3 1000 MG CAPS, Take 1 tablet by mouth daily., Disp: , Rfl:   Review of Systems  Constitutional: Positive for activity change and fatigue. Negative for appetite change, chills, diaphoresis, fever and unexpected weight change.  Eyes: Negative for visual disturbance.  Cardiovascular: Negative for chest pain, palpitations and leg swelling.  Endocrine: Negative for polydipsia, polyphagia and polyuria.  Psychiatric/Behavioral: Positive for dysphoric mood. Negative for confusion, decreased concentration and suicidal ideas.    Social History   Tobacco Use  . Smoking status: Former Smoker    Packs/day: 0.50    Years: 20.00    Pack years: 10.00    Last attempt to quit: 07/01/1994    Years since quitting: 23.0  . Smokeless tobacco: Never Used  Substance Use Topics  . Alcohol use: No   Objective:   BP 112/72 (BP Location: Left Arm, Patient Position: Sitting, Cuff Size: Large)   Pulse 67   Temp 98.4 F (36.9 C) (Oral)   Resp 16   Wt 240 lb (108.9 kg)   SpO2 97%   BMI 37.03 kg/m  Vitals:   07/26/17 0811  BP: 112/72  Pulse: 67  Resp: 16  Temp: 98.4 F (36.9 C)  TempSrc: Oral  SpO2: 97%  Weight: 240 lb (108.9 kg)     Physical Exam  Constitutional: She is oriented to person, place, and time. She appears well-developed and well-nourished. No distress.  HENT:  Head: Normocephalic and atraumatic.  Eyes: Conjunctivae are normal. No scleral icterus.  Cardiovascular: Normal rate, regular rhythm, normal heart sounds and intact distal pulses.  No murmur heard. Pulmonary/Chest: Effort normal and breath sounds normal. No respiratory distress. She has no wheezes. She has no rales.  Abdominal: Soft. She exhibits no distension. There is no tenderness.  Musculoskeletal:  She exhibits no edema or deformity.  Neurological: She is alert and oriented to person, place, and time.  Skin: Skin is warm and dry. No rash noted.  Psychiatric: Her behavior is normal. Thought content normal.  Depressed mood. Normal affect. No aVH. Appropriate groom and dress.  Vitals reviewed.       Assessment & Plan:   Problem List Items Addressed This Visit      Endocrine   T2DM (type 2 diabetes mellitus) ([place]) - Primary    Last A1c 7.2 Fasting blood sugars improved, but slightly elevated Continue Metformin 500mg  BID as she is tolerating this well Discussed diabetic diet in depth with patient Encouraged continued exercise UTD on foot exam, eye exam, vaccines F/u in 2 months and recheck A1c      Relevant  Medications   metFORMIN (GLUCOPHAGE) 500 MG tablet     Other   Depression    Uncontrolled and worsening Will stop lexapro and try cymbalta with quick ramp to full dose Discussed possible side effects If fails cymbalta, consider psych referral as patient seems to have failed most SSRIs and SNRIs F/u in 2 months and repeat PHQ9 and GAD7      Relevant Medications   DULoxetine (CYMBALTA) 30 MG capsule   Anxiety    Uncontrolled and worsening Will stop lexapro and try cymbalta with quick ramp to full dose Discussed possible side effects If fails cymbalta, consider psych referral as patient seems to have failed most SSRIs and SNRIs F/u in 2 months and repeat PHQ9 and GAD7      Relevant Medications   DULoxetine (CYMBALTA) 30 MG capsule   Obesity    Congratulated patient on 10 lb weight loss over last month Discussed diet in depth Continue walking for exercise      Relevant Medications   metFORMIN (GLUCOPHAGE) 500 MG tablet   HLD (hyperlipidemia)    Doing well on lipitor Recheck lipid panel and CMP to see if LDL has improved      Relevant Orders   Comprehensive metabolic panel   Lipid panel       Return in about 2 months (around 09/23/2017) for Diabetes  and depression f/u.   The entirety of the information documented in the History of Present Illness, Review of Systems and Physical Exam were personally obtained by me. Portions of this information were initially documented by Raquel Sarna Ratchford, CMA and reviewed by me for thoroughness and accuracy.    Virginia Crews, MD, MPH [facility] 07/26/2017 9:59 AM

## 2017-07-26 NOTE — Assessment & Plan Note (Signed)
Uncontrolled and worsening Will stop lexapro and try cymbalta with quick ramp to full dose Discussed possible side effects If fails cymbalta, consider psych referral as patient seems to have failed most SSRIs and SNRIs F/u in 2 months and repeat PHQ9 and GAD7

## 2017-07-26 NOTE — Assessment & Plan Note (Signed)
Last A1c 7.2 Fasting blood sugars improved, but slightly elevated Continue Metformin 500mg  BID as she is tolerating this well Discussed diabetic diet in depth with patient Encouraged continued exercise UTD on foot exam, eye exam, vaccines F/u in 2 months and recheck A1c

## 2017-07-27 LAB — COMPREHENSIVE METABOLIC PANEL
ALT: 23 IU/L (ref 0–32)
AST: 18 IU/L (ref 0–40)
Albumin/Globulin Ratio: 1.5 (ref 1.2–2.2)
Albumin: 4.4 g/dL (ref 3.5–5.5)
Alkaline Phosphatase: 79 IU/L (ref 39–117)
BUN/Creatinine Ratio: 18 (ref 9–23)
BUN: 12 mg/dL (ref 6–24)
Bilirubin Total: 0.2 mg/dL (ref 0.0–1.2)
CO2: 23 mmol/L (ref 20–29)
Calcium: 9.4 mg/dL (ref 8.7–10.2)
Chloride: 100 mmol/L (ref 96–106)
Creatinine, Ser: 0.67 mg/dL (ref 0.57–1.00)
GFR calc Af Amer: 114 mL/min/{1.73_m2} (ref >59)
GFR calc non Af Amer: 99 mL/min/{1.73_m2} (ref >59)
Globulin, Total: 3 g/dL (ref 1.5–4.5)
Glucose: 112 mg/dL — ABNORMAL HIGH (ref 65–99)
Potassium: 4.6 mmol/L (ref 3.5–5.2)
Sodium: 138 mmol/L (ref 134–144)
Total Protein: 7.4 g/dL (ref 6.0–8.5)

## 2017-07-27 LAB — LIPID PANEL
Chol/HDL Ratio: 2.8 ratio (ref 0.0–4.4)
Cholesterol, Total: 120 mg/dL (ref 100–199)
HDL: 43 mg/dL (ref >39)
LDL Calculated: 58 mg/dL (ref 0–99)
Triglycerides: 94 mg/dL (ref 0–149)
VLDL Cholesterol Cal: 19 mg/dL (ref 5–40)

## 2017-07-29 ENCOUNTER — Telehealth: Payer: Self-pay

## 2017-07-29 NOTE — Telephone Encounter (Signed)
That sounds good to me.  Virginia Crews, MD, MPH [facility] 07/29/2017 2:08 PM

## 2017-07-29 NOTE — Telephone Encounter (Signed)
FYIRemo Lipps pharmacist at [place] states patient's insurance has a quantity limit on Cymbalta to 60 capsules per month.   Since RX was for 67 capsules. RX can be filled as 30 mg RX for 7 days, then a 60 mg RX for 30 days with 1 refill.   That prevents a PA from having to be submitted for quantity limit.   If that is not okay please contact [name] at [facility].

## 2017-07-30 ENCOUNTER — Telehealth: Payer: Self-pay

## 2017-07-30 NOTE — Telephone Encounter (Signed)
Le ft message advising pt of recent lab results. OK per DPR.

## 2017-08-05 ENCOUNTER — Ambulatory Visit
Admission: RE | Admit: 2017-08-05 | Discharge: 2017-08-05 | Disposition: A | Discharge status: Home / Self Care | Payer: 59 | Source: Ambulatory Visit | Attending: Family Medicine | Admitting: Family Medicine

## 2017-08-05 DIAGNOSIS — Z1239 Encounter for other screening for malignant neoplasm of breast: Secondary | ICD-10-CM

## 2017-08-05 DIAGNOSIS — Z1231 Encounter for screening mammogram for malignant neoplasm of breast: Secondary | ICD-10-CM | POA: Diagnosis present

## 2017-08-12 ENCOUNTER — Inpatient Hospital Stay
Admission: RE | Admit: 2017-08-12 | Discharge: 2017-08-12 | Disposition: A | Discharge status: Home / Self Care | Payer: Self-pay | Source: Ambulatory Visit | Attending: *Deleted | Admitting: *Deleted

## 2017-08-12 ENCOUNTER — Other Ambulatory Visit: Payer: Self-pay | Admitting: *Deleted

## 2017-08-12 DIAGNOSIS — Z9289 Personal history of other medical treatment: Secondary | ICD-10-CM

## 2017-08-14 ENCOUNTER — Telehealth: Payer: Self-pay

## 2017-08-14 NOTE — Telephone Encounter (Signed)
Left message advising pt. OK per DPR. 

## 2017-08-14 NOTE — Telephone Encounter (Signed)
-----   Message from Virginia Crews, MD sent at 08/12/2017  5:03 PM EST ----- Normal mammogram  [place], Dionne Bucy, MD, MPH [facility] 08/12/2017 5:03 PM

## 2017-08-23 ENCOUNTER — Telehealth: Payer: Self-pay | Admitting: Family Medicine

## 2017-08-23 NOTE — Telephone Encounter (Signed)
Let's have her come in so someone can look at it  [facility], Dionne Bucy, MD, MPH [facility] 08/23/2017 12:31 PM

## 2017-08-23 NOTE — Telephone Encounter (Signed)
Please review

## 2017-08-23 NOTE — Telephone Encounter (Signed)
Pt advised. Pt states she can not come in today, but she will contact office tomorrow for Saturday clinic.

## 2017-08-23 NOTE — Telephone Encounter (Signed)
Pt stated that she went to [place] last Saturday 08/17/17 and was Dx with cellulitis and she is finished with her antibiotic but she still has tenderness in the area. Pt is asking if she should try something else or if she should come in for a recheck. Please advise. Thanks TNP

## 2017-09-11 ENCOUNTER — Ambulatory Visit: Payer: 59 | Admitting: Family Medicine

## 2017-09-11 ENCOUNTER — Encounter: Payer: Self-pay | Admitting: Family Medicine

## 2017-09-11 VITALS — BP 120/64 | HR 85 | Temp 98.0°F | Resp 16 | Wt 239.0 lb

## 2017-09-11 DIAGNOSIS — F419 Anxiety disorder, unspecified: Secondary | ICD-10-CM

## 2017-09-11 DIAGNOSIS — F331 Major depressive disorder, recurrent, moderate: Secondary | ICD-10-CM | POA: Diagnosis not present

## 2017-09-11 MED ORDER — FLUOXETINE HCL 20 MG PO TABS: ORAL_TABLET | ORAL | 0 refills | Status: DC

## 2017-09-11 NOTE — Assessment & Plan Note (Signed)
Uncontrolled but less severe than depression currently Switch to prozac as above Referral to psych as above

## 2017-09-11 NOTE — Progress Notes (Signed)
Patient: Angelica Medina Female    DOB: 05-11-1961   57 y.o.   MRN: [id number] Visit Date: 09/11/2017  Today's Provider: Lavon Paganini, MD   Chief Complaint  Patient presents with  . Depression   Subjective:    Depression         This is a recurrent (LOV 6 weeks ago. She was advised to D/C Lexapro and start Cymbalta ) problem.  The problem has been gradually worsening since onset.  Associated symptoms include fatigue (and hypersomnia), hopelessness, irritable, restlessness, decreased interest and sad.  Associated symptoms include no decreased concentration, no helplessness, does not have insomnia, no appetite change, no body aches, no myalgias, no headaches, no indigestion and no suicidal ideas.     Exacerbated by: "adjustments". Pt recently moved to the area and has no local friends. She is also staying at home to watch her granddaughter, which is another "adjustment".  Past treatments include SSRIs - Selective serotonin reuptake inhibitors and SNRIs - Serotonin and norepinephrine reuptake inhibitors.  Compliance with treatment is good.  Previous treatment provided no relief relief.  Feels like anxiety is well controlled, but depression is worse.  No motivation to get out of bed.  Doesn't enjoy doing things that she previously enjoyed.  Can remember taking a medication when she was much younger that did help more. She stopped taking it when she felt better.      Allergies  Allergen Reactions  . Wellbutrin [Bupropion] Anxiety     Current Outpatient Medications:  .  atorvastatin (LIPITOR) 20 MG tablet, Take 1 tablet (20 mg total) by mouth daily., Disp: 90 tablet, Rfl: 2 .  b complex vitamins capsule, Take 1 capsule by mouth daily., Disp: , Rfl:  .  lisinopril (PRINIVIL,ZESTRIL) 5 MG tablet, Take 5 mg by mouth daily., Disp: , Rfl:  .  metFORMIN (GLUCOPHAGE) 500 MG tablet, Take 1 tablet (500 mg total) by mouth 2 (two) times daily with a meal., Disp: 60 tablet, Rfl: 2 .  Multiple  Vitamin (MULTIVITAMIN) tablet, Take 1 tablet by mouth daily., Disp: , Rfl:  .  Omega-3 1000 MG CAPS, Take 1 tablet by mouth daily., Disp: , Rfl:  .  FLUoxetine (PROZAC) 20 MG tablet, Take 1 tablet (20 mg total) by mouth daily for 7 days, THEN 2 tablets (40 mg total) daily for 7 days, THEN 3 tablets (60 mg total) daily for 14 days., Disp: 63 tablet, Rfl: 0  Review of Systems  Constitutional: Positive for fatigue (and hypersomnia). Negative for appetite change and fever.  Respiratory: Negative.   Cardiovascular: Negative.   Genitourinary: Negative.   Musculoskeletal: Negative for myalgias.  Skin: Negative.   Neurological: Negative for dizziness, tremors, numbness and headaches.  Psychiatric/Behavioral: Positive for depression and dysphoric mood. Negative for agitation, decreased concentration, self-injury and suicidal ideas. The patient is nervous/anxious. The patient does not have insomnia.     Social History   Tobacco Use  . Smoking status: Former Smoker    Packs/day: 0.50    Years: 20.00    Pack years: 10.00    Last attempt to quit: 07/01/1994    Years since quitting: 23.2  . Smokeless tobacco: Never Used  Substance Use Topics  . Alcohol use: No   Objective:   BP 120/64 (BP Location: Left Arm, Patient Position: Sitting, Cuff Size: Large)   Pulse 85   Temp 98 F (36.7 C) (Oral)   Resp 16   Wt 239 lb (108.4 kg)  SpO2 98%   BMI 36.88 kg/m  Vitals:   09/11/17 1450  BP: 120/64  Pulse: 85  Resp: 16  Temp: 98 F (36.7 C)  TempSrc: Oral  SpO2: 98%  Weight: 239 lb (108.4 kg)     Physical Exam  Constitutional: She is oriented to person, place, and time. She appears well-developed and well-nourished. She is irritable. No distress.  HENT:  Head: Normocephalic and atraumatic.  Eyes: Conjunctivae are normal. No scleral icterus.  Cardiovascular: Normal rate, regular rhythm, normal heart sounds and intact distal pulses.  No murmur heard. Pulmonary/Chest: Effort normal and  breath sounds normal. No respiratory distress. She has no wheezes. She has no rales.  Musculoskeletal: She exhibits no edema.  Neurological: She is alert and oriented to person, place, and time.  Skin: Skin is warm and dry. No rash noted.  Psychiatric: Her speech is normal and behavior is normal. Judgment normal. Her mood appears not anxious. Her affect is blunt. Her affect is not labile. Cognition and memory are normal. She exhibits a depressed mood. She expresses no homicidal and no suicidal ideation. She expresses no suicidal plans and no homicidal plans.  Vitals reviewed.   [facility] 2/9 09/11/2017 07/26/2017 06/28/2017 03/29/2017 02/28/2017  Decreased Interest 3 3 0 1 1  Down, Depressed, Hopeless 3 3 0 0 1  PHQ - 2 Score 6 6 0 1 2  Altered sleeping 3 3 3 1  0  Tired, decreased energy 3 3 3 1 1   Change in appetite 2 1 2 2 1   Feeling bad or failure about yourself  2 3 1  0 -  Trouble concentrating 1 1 1  0 0  Moving slowly or fidgety/restless 0 0 0 0 0  Suicidal thoughts 0 0 0 0 0  PHQ-9 Score 17 17 10 5 4   Difficult doing work/chores Very difficult Very difficult Not difficult at all Not difficult at all Not difficult at all    GAD 7 : Generalized Anxiety Score 09/11/2017 07/26/2017 06/28/2017 03/29/2017  Nervous, Anxious, on Edge 2 2 3 3   Control/stop worrying 1 2 1  0  Worry too much - different things 1 2 1  0  Trouble relaxing 1 1 1 2   Restless 0 0 0 1  Easily annoyed or irritable 1 2 3 2   Afraid - awful might happen 1 2 1  0  Total GAD 7 Score 7 11 10 8   Anxiety Difficulty Somewhat difficult Very difficult Somewhat difficult -         Assessment & Plan:      Problem List Items Addressed This Visit      Other   Depression - Primary    Uncontrolled and worsening Will stop cymbalta and try Prozac with quick ramp to full dose Discussed possible side effects Referral to psych as patient has failed many antidepressants May need to consider augmentation with atypical  antipsychotic or something F/u in 2 months if not in to see psych yet      Relevant Medications   FLUoxetine (PROZAC) 20 MG tablet   Other Relevant Orders   Ambulatory referral to Psychiatry   Anxiety    Uncontrolled but less severe than depression currently Switch to prozac as above Referral to psych as above      Relevant Medications   FLUoxetine (PROZAC) 20 MG tablet   Other Relevant Orders   Ambulatory referral to Psychiatry       Return in about 2 months (around 11/11/2017) for depression/diabetes f/u.  The entirety of the information documented in the History of Present Illness, Review of Systems and Physical Exam were personally obtained by me. Portions of this information were initially documented by Raquel Sarna Ratchford, CMA and reviewed by me for thoroughness and accuracy.    Virginia Crews, MD, MPH [facility] 09/11/2017 4:34 PM

## 2017-09-11 NOTE — Assessment & Plan Note (Signed)
Uncontrolled and worsening Will stop cymbalta and try Prozac with quick ramp to full dose Discussed possible side effects Referral to psych as patient has failed many antidepressants May need to consider augmentation with atypical antipsychotic or something F/u in 2 months if not in to see psych yet

## 2017-09-12 ENCOUNTER — Other Ambulatory Visit: Payer: Self-pay

## 2017-09-12 ENCOUNTER — Emergency Department: Payer: 59

## 2017-09-12 ENCOUNTER — Emergency Department
Admission: EM | Admit: 2017-09-12 | Discharge: 2017-09-12 | Disposition: A | Discharge status: Home / Self Care | Payer: 59 | Attending: Emergency Medicine | Admitting: Emergency Medicine

## 2017-09-12 DIAGNOSIS — S6992XA Unspecified injury of left wrist, hand and finger(s), initial encounter: Secondary | ICD-10-CM | POA: Diagnosis present

## 2017-09-12 DIAGNOSIS — I10 Essential (primary) hypertension: Secondary | ICD-10-CM | POA: Diagnosis not present

## 2017-09-12 DIAGNOSIS — Z7984 Long term (current) use of oral hypoglycemic drugs: Secondary | ICD-10-CM | POA: Diagnosis not present

## 2017-09-12 DIAGNOSIS — S52502A Unspecified fracture of the lower end of left radius, initial encounter for closed fracture: Secondary | ICD-10-CM | POA: Insufficient documentation

## 2017-09-12 DIAGNOSIS — Z87891 Personal history of nicotine dependence: Secondary | ICD-10-CM | POA: Insufficient documentation

## 2017-09-12 DIAGNOSIS — Z79899 Other long term (current) drug therapy: Secondary | ICD-10-CM | POA: Insufficient documentation

## 2017-09-12 DIAGNOSIS — Y92830 Public park as the place of occurrence of the external cause: Secondary | ICD-10-CM | POA: Diagnosis not present

## 2017-09-12 DIAGNOSIS — Y939 Activity, unspecified: Secondary | ICD-10-CM | POA: Diagnosis not present

## 2017-09-12 DIAGNOSIS — W010XXA Fall on same level from slipping, tripping and stumbling without subsequent striking against object, initial encounter: Secondary | ICD-10-CM | POA: Diagnosis not present

## 2017-09-12 DIAGNOSIS — E119 Type 2 diabetes mellitus without complications: Secondary | ICD-10-CM | POA: Insufficient documentation

## 2017-09-12 DIAGNOSIS — R55 Syncope and collapse: Secondary | ICD-10-CM | POA: Insufficient documentation

## 2017-09-12 DIAGNOSIS — S62102A Fracture of unspecified carpal bone, left wrist, initial encounter for closed fracture: Secondary | ICD-10-CM

## 2017-09-12 DIAGNOSIS — Y999 Unspecified external cause status: Secondary | ICD-10-CM | POA: Insufficient documentation

## 2017-09-12 LAB — GLUCOSE, CAPILLARY: Glucose-Capillary: 159 mg/dL — ABNORMAL HIGH (ref 65–99)

## 2017-09-12 MED ORDER — SODIUM CHLORIDE 0.9 % IV BOLUS (SEPSIS)
1000.0000 mL | Freq: Once | INTRAVENOUS | Status: AC
  Administered 2017-09-12: 1000 mL via INTRAVENOUS

## 2017-09-12 MED ORDER — OXYCODONE-ACETAMINOPHEN 5-325 MG PO TABS
1.0000 | ORAL_TABLET | Freq: Once | ORAL | Status: AC
  Administered 2017-09-12: 1 via ORAL
  Filled 2017-09-12: qty 1

## 2017-09-12 MED ORDER — FENTANYL CITRATE (PF) 100 MCG/2ML IJ SOLN
50.0000 ug | Freq: Once | INTRAMUSCULAR | Status: AC
  Administered 2017-09-12: 50 ug via INTRAVENOUS
  Filled 2017-09-12: qty 2

## 2017-09-12 MED ORDER — OXYCODONE-ACETAMINOPHEN 5-325 MG PO TABS: 1.0000 | ORAL_TABLET | ORAL | 0 refills | Status: DC | PRN

## 2017-09-12 NOTE — ED Notes (Signed)
Resumed care from [place] rn.  Pt alert.  Splint in place on left forearm.  Family with pt

## 2017-09-12 NOTE — ED Triage Notes (Signed)
Pt states she stepped off a curb and lost her balance and fell injuring her left wrist.. Denies any other injures.

## 2017-09-12 NOTE — ED Notes (Signed)
Pt signed esignature.  D/c  inst to pt.  

## 2017-09-12 NOTE — ED Provider Notes (Addendum)
[facility] Emergency Department Provider Note  ____________________________________________   I have reviewed the triage vital signs and the nursing notes. Where available I have reviewed prior notes and, if possible and indicated, outside hospital notes.    HISTORY  Chief Complaint Wrist Injury    HPI Angelica Medina is a 57 y.o. female with a history of vasovagal symptoms with pain, states that she was playing with her granddaughter in the park and she tripped and landed on her extended wrist.  She did not hit her head she suffered no other injuries she has no other pain anywhere she has no neck pain she has no abdominal pain she has no chest pain, she states that she felt immediate pain to her left wrist, she does not have any numbness or tingling.  Patient states that she has had no fever or chills.  The pain is persistent.  She states that every time she gets in the pain, her blood pressure drops and this happens for her daughter as well, she did not have any presyncopal symptoms such as lightheadedness etc. prior to the fall.  When I enter the room her blood pressure is 104 over palp.      Past Medical History:  Diagnosis Date  . Anxiety   . Depression   . Hypertension 2016  . T2DM (type 2 diabetes mellitus) ([place]) 2016   diet controlled    Patient Active Problem List   Diagnosis Date Noted  . HLD (hyperlipidemia) 06/28/2017  . Diabetic neuropathy ([place]) 06/28/2017  . OSA (obstructive sleep apnea) 02/28/2017  . Obesity 02/28/2017  . Thyroid nodule 02/28/2017  . Healthcare maintenance 02/28/2017  . Depression   . Anxiety   . T2DM (type 2 diabetes mellitus) ([place]) 07/02/2014  . Hypertension 07/02/2014    Past Surgical History:  Procedure Laterality Date  . [place]   for herniated disc, no hardware  . BREAST BIOPSY Left 2009   benign  . ECTOPIC PREGNANCY SURGERY    . ESOPHAGEAL DILATION  2014   has had several i nthe past for  [place]    Prior to Admission medications   Medication Sig Start Date End Date Taking? Authorizing Provider  atorvastatin (LIPITOR) 20 MG tablet Take 1 tablet (20 mg total) by mouth daily. 03/05/17   Virginia Crews, MD  b complex vitamins capsule Take 1 capsule by mouth daily.    [provider]  FLUoxetine (PROZAC) 20 MG tablet Take 1 tablet (20 mg total) by mouth daily for 7 days, THEN 2 tablets (40 mg total) daily for 7 days, THEN 3 tablets (60 mg total) daily for 14 days. 09/11/17 10/09/17  Virginia Crews, MD  lisinopril (PRINIVIL,ZESTRIL) 5 MG tablet Take 5 mg by mouth daily.    [provider]  metFORMIN (GLUCOPHAGE) 500 MG tablet Take 1 tablet (500 mg total) by mouth 2 (two) times daily with a meal. 07/26/17   Bacigalupo, Dionne Bucy, MD  Multiple Vitamin (MULTIVITAMIN) tablet Take 1 tablet by mouth daily.    [provider]  Omega-3 1000 MG CAPS Take 1 tablet by mouth daily.    [provider]    Allergies Wellbutrin [bupropion]  Family History  Problem Relation Age of Onset  . Thyroid cancer Mother   . Depression Father   . Diabetes Father   . COPD Father   . Hypothyroidism Sister   . Healthy Brother   . Anxiety disorder Sister   . Depression  Sister     Social History Social History   Tobacco Use  . Smoking status: Former Smoker    Packs/day: 0.50    Years: 20.00    Pack years: 10.00    Last attempt to quit: 07/01/1994    Years since quitting: 23.2  . Smokeless tobacco: Never Used  Substance Use Topics  . Alcohol use: No  . Drug use: No    Review of Systems Constitutional: No fever/chills Eyes: No visual changes. ENT: No sore throat. No stiff neck no neck pain Cardiovascular: Denies chest pain. Respiratory: Denies shortness of breath. Gastrointestinal:   no vomiting.  No diarrhea.  No constipation. Genitourinary: Negative for dysuria. Musculoskeletal: Negative lower extremity swelling Skin: Negative for  rash. Neurological: Negative for severe headaches, focal weakness or numbness.   ____________________________________________   PHYSICAL EXAM:  VITAL SIGNS: ED Triage Vitals  Enc Vitals Group     BP 09/12/17 1304 (!) 81/46     Pulse Rate 09/12/17 1304 89     Resp 09/12/17 1304 16     Temp 09/12/17 1304 97.7 F (36.5 C)     Temp Source 09/12/17 1304 Oral     SpO2 09/12/17 1304 98 %     Weight 09/12/17 1258 239 lb (108.4 kg)     Height 09/12/17 1258 5\' 5"  (1.651 m)     Head Circumference --      Peak Flow --      Pain Score 09/12/17 1419 8     Pain Loc --      Pain Edu? --      Excl. in [place]? --     Constitutional: Alert and oriented. Well appearing and in no acute distress. Eyes: Conjunctivae are normal Head: Atraumatic HEENT: No congestion/rhinnorhea. Mucous membranes are moist.  Oropharynx non-erythematous Neck:   Nontender with no meningismus, no masses, no stridor Cardiovascular: Normal rate, regular rhythm. Grossly normal heart sounds.  Good peripheral circulation. Respiratory: Normal respiratory effort.  No retractions. Lungs CTAB. Abdominal: Soft and nontender. No distention. No guarding no rebound Back:  There is no focal tenderness or step off.  there is no midline tenderness there are no lesions noted. there is no CVA tenderness Musculoskeletal: No lower extremity tenderness, there is no significant deformity but there is certainly tenderness to palpation to the left wrist limited range of motion because of pain, strong distal pulses neurovascularly intact Hartmann's are soft no elbow pain or discomfort no shoulder pain or discomfort no break in the skin patient did have a ring on her left finger, I did take that off and give it to her husband.  No joint effusions, no DVT signs strong distal pulses no edema Neurologic:  Normal speech and language. No gross focal neurologic deficits are appreciated.  Skin:  Skin is warm, dry and intact. No rash noted. Psychiatric: Mood  and affect are normal. Speech and behavior are normal.  ____________________________________________   LABS (all labs ordered are listed, but only abnormal results are displayed)  Labs Reviewed  GLUCOSE, CAPILLARY - Abnormal; Notable for the following components:      Result Value   Glucose-Capillary 159 (*)    All other components within normal limits    Pertinent labs  results that were available during my care of the patient were reviewed by me and considered in my medical decision making (see chart for details). ____________________________________________  EKG  I personally interpreted any EKGs ordered by me or triage  ____________________________________________  RADIOLOGY  Pertinent  labs & imaging results that were available during my care of the patient were reviewed by me and considered in my medical decision making (see chart for details). If possible, patient and/or family made aware of any abnormal findings.  Dg Wrist Complete Left  Result Date: 09/12/2017 CLINICAL DATA:  [place].  Pain. EXAM: LEFT WRIST - COMPLETE 3+ VIEW COMPARISON:  None. FINDINGS: Comminuted fracture of the distal radius, with neutral angulation. Fracture extends to the articular surface with the carpal bones. Ulnar styloid is intact. Soft tissue swelling is observed. IMPRESSION: Comminuted fracture of the distal radius, is intra-articular. Neutral angulation. Electronically Signed   By: Staci Righter M.D.   On: 09/12/2017 14:15   ____________________________________________    PROCEDURES  Procedure(s) performed: None  Procedures  Critical Care performed: None  ____________________________________________   INITIAL IMPRESSION / ASSESSMENT AND PLAN / ED COURSE  Pertinent labs & imaging results that were available during my care of the patient were reviewed by me and considered in my medical decision making (see chart for details).  Patient here with a non-syncopal fall with a wrist  fracture, closed, comminuted, neurovascularly intact.  Initial blood pressure was somewhat low, I do tend to believe the patient that this is a pain response.  I do not think there is a "bleeding into her spleen or anything else causing this.  Nor is there any indication that this was a syncopal event she was running after her daughter and she fell.  Therefore, I do not think further blood work or other workup is necessary.  Given her IV fluid and her pressure is responding perfectly well.  We are giving her some pain medication will place is in a splint and I have paged orthopedic surgery.  ----------------------------------------- 2:48 PM on 09/12/2017 -----------------------------------------  Dr. Harlow Mares of orthopedic surgery evaluated the patient's x-ray, we did discuss the patient' mechanism of injury as well as her physical findings etc., and he agrees with splint placement and close outpatient follow-up with pain medication. bp is 110/p which she describes as her baseline.  She has no evidence of other injury, her splint has very much helped her pain she is neurovascularly intact at this time, we will discharge intensive return precautions and follow-up given and understood    ____________________________________________   FINAL CLINICAL IMPRESSION(S) / ED DIAGNOSES  Final diagnoses:  None      This chart was dictated using voice recognition software.  Despite best efforts to proofread,  errors can occur which can change meaning.      Schuyler Amor, MD 09/12/17 1437    Schuyler Amor, MD 09/12/17 1448    Schuyler Amor, MD 09/12/17 1537

## 2017-09-12 NOTE — ED Notes (Addendum)
Spoke with Dr. Reita Cliche regarding patient. Orders received for IV infusion.

## 2017-09-12 NOTE — ED Notes (Signed)
Pt states that when she is in pain, she often feels near syncopal. Takes BP meds, took normal amount today.

## 2017-09-13 ENCOUNTER — Telehealth: Payer: Self-pay

## 2017-09-13 ENCOUNTER — Telehealth: Payer: Self-pay | Admitting: Family Medicine

## 2017-09-13 MED ORDER — TRAMADOL HCL 50 MG PO TABS: 50.0000 mg | ORAL_TABLET | Freq: Four times a day (QID) | ORAL | 0 refills | Status: DC | PRN

## 2017-09-13 NOTE — Telephone Encounter (Signed)
Pt states she broke her left arm and will have to have surgery on 09/17/17.

## 2017-09-13 NOTE — Telephone Encounter (Signed)
Sounds good.  Stop Percocet. Take antihistamine.  Rx for tramadol for pain sent to pharmacy. Let us know if any problems with this.  Virginia Crews, MD, MPH [facility] 09/13/2017 12:00 PM

## 2017-09-13 NOTE — Telephone Encounter (Signed)
Patient was given a prescription for pain medication in the ER yesterday for a fracture.  Today she is itching all over.  No rash and no difficulty breathing.  She was advised to discontinue the pain medicine, take an antihistamine and I would let you know and see if you could give her something else for her pain.  She uses [facility] and her call back number is [phone]

## 2017-09-13 NOTE — Telephone Encounter (Signed)
FYI. KW 

## 2017-09-13 NOTE — Telephone Encounter (Signed)
Received Medical Clearance form from [facility]. Placed in providers box to be fill out. Thanks CC

## 2017-09-13 NOTE — Telephone Encounter (Signed)
LMTCB 09/13/2017  Thanks,   -Mickel Baas

## 2017-09-16 ENCOUNTER — Ambulatory Visit: Payer: Self-pay | Admitting: Orthopedic Surgery

## 2017-09-16 ENCOUNTER — Encounter: Payer: Self-pay | Admitting: Family Medicine

## 2017-09-16 ENCOUNTER — Ambulatory Visit (INDEPENDENT_AMBULATORY_CARE_PROVIDER_SITE_OTHER): Payer: 59 | Admitting: Family Medicine

## 2017-09-16 VITALS — BP 130/84 | HR 73 | Temp 98.3°F | Resp 16 | Wt 234.0 lb

## 2017-09-16 DIAGNOSIS — Z01818 Encounter for other preprocedural examination: Secondary | ICD-10-CM

## 2017-09-16 LAB — POCT URINALYSIS DIPSTICK
Appearance: NORMAL
Bilirubin, UA: NEGATIVE
Glucose, UA: NEGATIVE
Ketones, UA: NEGATIVE
Leukocytes, UA: NEGATIVE
Nitrite, UA: NEGATIVE
Odor: NORMAL
Protein, UA: NEGATIVE
Spec Grav, UA: 1.015 (ref 1.010–1.025)
Urobilinogen, UA: 0.2 E.U./dL
pH, UA: 6.5 (ref 5.0–8.0)

## 2017-09-16 MED ORDER — CEFAZOLIN SODIUM-DEXTROSE 2-4 GM/100ML-% IV SOLN
2.0000 g | INTRAVENOUS | Status: AC
  Administered 2017-09-17: 2 g via INTRAVENOUS

## 2017-09-16 NOTE — Telephone Encounter (Signed)
Pre-op done and faxed to [facility] at [facility] per Ashley's request.

## 2017-09-16 NOTE — Progress Notes (Signed)
Patient: Angelica Medina Female    DOB: 01-Aug-1960   58 y.o.   MRN: [id number] Visit Date: 09/16/2017  Today's Provider: Lavon Paganini, MD   I, Martha Clan, CMA, am acting as scribe for Lavon Paganini, MD.  Chief Complaint  Patient presents with  . Pre-op Exam   Subjective:    HPI   Pt presents for a pre-op exam. She is having a left wrist ORIF performed by Dr. Harlow Mares tomorrow. She is not taking an anti-coagulant for ASA therapy. She stopped taking Fish Oil on 09/12/2017. She has also stopped taking Tramadol last night. She has never had an adverse reaction to anesthesia.  Pt is a 57 y.o. female who is here for preoperative clearance for Left ORIF Wrist  1) High Risk Cardiac Conditions  1) Recent MI - No.  2) Decompensated Heart Failure - No.  3) Unstable angina - No.  4) Symptomatic arrythmia - No.  5) Sx Valvular Disease - No.  2) Intermediate Risk Factors - DM, CKD, CVA, CHF, CAD - Yes.  DM, controlled. Last A1c 7.2  2) Functional Status - > 4 mets (Walk, run, climb stairs) Yes.  Rob Hickman Activity Status Index: 50.7 (8.97 METs)  3) Surgery Specific Risk - Intermediate (Carotid, Head and Neck, Orthopaedic)  4) Further Noninvasive evaluation -   1) EKG - Yes.     1) Hx of CVA, CAD, DM, CKD  2) Echo - No.   1) Worsening dyspnea   3) Stress Testing - Active Cardiac Disease - No.  5) Need for medical therapy - Beta Blocker, Statins indicated ? No.    Allergies  Allergen Reactions  . Oxycodone Itching  . Wellbutrin [Bupropion] Anxiety     Current Outpatient Medications:  .  acetaminophen (TYLENOL) 650 MG CR tablet, Take 650 mg by mouth every 8 (eight) hours as needed for pain., Disp: , Rfl:  .  atorvastatin (LIPITOR) 20 MG tablet, Take 1 tablet (20 mg total) by mouth daily., Disp: 90 tablet, Rfl: 2 .  b complex vitamins capsule, Take 1 capsule by mouth daily., Disp: , Rfl:  .  FLUoxetine (PROZAC) 20 MG tablet, Take 1 tablet (20 mg total) by mouth daily  for 7 days, THEN 2 tablets (40 mg total) daily for 7 days, THEN 3 tablets (60 mg total) daily for 14 days., Disp: 63 tablet, Rfl: 0 .  lisinopril (PRINIVIL,ZESTRIL) 5 MG tablet, Take 5 mg by mouth daily., Disp: , Rfl:  .  metFORMIN (GLUCOPHAGE) 500 MG tablet, Take 1 tablet (500 mg total) by mouth 2 (two) times daily with a meal., Disp: 60 tablet, Rfl: 2 .  Multiple Vitamin (MULTIVITAMIN) tablet, Take 1 tablet by mouth daily., Disp: , Rfl:  .  Omega-3 1000 MG CAPS, Take 1 tablet by mouth daily., Disp: , Rfl:  .  traMADol (ULTRAM) 50 MG tablet, Take 1 tablet (50 mg total) by mouth every 6 (six) hours as needed. (Patient not taking: Reported on 09/16/2017), Disp: 30 tablet, Rfl: 0  Review of Systems  Constitutional: Negative for activity change, appetite change, chills, diaphoresis, fatigue, fever and unexpected weight change.  Respiratory: Negative for cough, chest tightness, shortness of breath and wheezing.   Cardiovascular: Negative for chest pain, palpitations and leg swelling.    Social History   Tobacco Use  . Smoking status: Former Smoker    Packs/day: 0.50    Years: 20.00    Pack years: 10.00    Last attempt to quit: 07/01/1994  Years since quitting: 23.2  . Smokeless tobacco: Never Used  Substance Use Topics  . Alcohol use: No   Objective:   BP 130/84 (BP Location: Right Arm, Patient Position: Sitting, Cuff Size: Large)   Pulse 73   Temp 98.3 F (36.8 C) (Oral)   Resp 16   Wt 234 lb (106.1 kg)   SpO2 98%   BMI 38.94 kg/m  Vitals:   09/16/17 1615  BP: 130/84  Pulse: 73  Resp: 16  Temp: 98.3 F (36.8 C)  TempSrc: Oral  SpO2: 98%  Weight: 234 lb (106.1 kg)     Physical Exam  Constitutional: She is oriented to person, place, and time. She appears well-developed and well-nourished. No distress.  HENT:  Head: Normocephalic and atraumatic.  Cardiovascular: Normal rate, regular rhythm, normal heart sounds and intact distal pulses.  No murmur  heard. Pulmonary/Chest: Effort normal and breath sounds normal. No respiratory distress. She has no wheezes. She has no rales.  Abdominal: Soft. She exhibits no distension. There is no tenderness.  Musculoskeletal: She exhibits no edema.  Neurological: She is alert and oriented to person, place, and time.  Skin: Skin is warm and dry. No rash noted.  Psychiatric: She has a normal mood and affect. Her behavior is normal.  Vitals reviewed.   Results for orders placed or performed in visit on 09/16/17  POCT urinalysis dipstick  Result Value Ref Range   Color, UA yellow    Clarity, UA clear    Glucose, UA Negative    Bilirubin, UA Negative    Ketones, UA Negative    Spec Grav, UA 1.015 1.010 - 1.025   Blood, UA Non-Hemolyzed Trace    pH, UA 6.5 5.0 - 8.0   Protein, UA Negative    Urobilinogen, UA 0.2 0.2 or 1.0 E.U./dL   Nitrite, UA Negative    Leukocytes, UA Negative Negative   Appearance normal    Odor normal        Assessment & Plan:  1. Preoperative examination I have independently evaluated patient.  Angelica Medina is a 57 y.o. female who is low risk for a intermediate risk surgery.  There are/ are not modifiable risk factors (smoking, etc). Angelica Medina's RCRI/NSQIP calculation for MACE is: 0%.    EKG reviewed from 09/12/17 with sinus bradycardia, but regular rate today Hold Metformin, lisinopril, and Statin on day of surgery, may resume day after (hold Metformin for 2 days if any contrast necessary) - POCT urinalysis dipstick - shows trace blood - this is chronic for patient as she has benign familial hematuria - CBC pending - will be available tomorrow, will send CBC from 01/2017 - reviewed recent CMP, Hgb A1c  Patient cleared from medical standpoint for surgery tomorrow   The entirety of the information documented in the History of Present Illness, Review of Systems and Physical Exam were personally obtained by me. Portions of this information were initially documented  by Raquel Sarna Ratchford, CMA and reviewed by me for thoroughness and accuracy.    Virginia Crews, MD, MPH [facility] 09/16/2017 4:37 PM

## 2017-09-16 NOTE — Telephone Encounter (Signed)
Pt advised in OV.

## 2017-09-16 NOTE — Telephone Encounter (Signed)
Placed on provider's desk for review.

## 2017-09-16 NOTE — Telephone Encounter (Signed)
lmtcb

## 2017-09-16 NOTE — Telephone Encounter (Signed)
Pt has surgery scheduled for tomorrow. Received this fax on (Friday) 09/13/2017 near the end of the workday. LMTCB to schedule pre-op for today.

## 2017-09-17 ENCOUNTER — Other Ambulatory Visit: Payer: Self-pay

## 2017-09-17 ENCOUNTER — Encounter
Admission: RE | Disposition: A | Discharge status: Home / Self Care | Payer: Self-pay | Source: Ambulatory Visit | Attending: Orthopedic Surgery

## 2017-09-17 ENCOUNTER — Ambulatory Visit
Admission: RE | Admit: 2017-09-17 | Discharge: 2017-09-17 | Disposition: A | Discharge status: Home / Self Care | Payer: 59 | Source: Ambulatory Visit | Attending: Orthopedic Surgery | Admitting: Orthopedic Surgery

## 2017-09-17 ENCOUNTER — Telehealth: Payer: Self-pay

## 2017-09-17 ENCOUNTER — Ambulatory Visit: Payer: 59 | Admitting: Anesthesiology

## 2017-09-17 DIAGNOSIS — S52502A Unspecified fracture of the lower end of left radius, initial encounter for closed fracture: Secondary | ICD-10-CM | POA: Diagnosis present

## 2017-09-17 DIAGNOSIS — X58XXXA Exposure to other specified factors, initial encounter: Secondary | ICD-10-CM | POA: Diagnosis not present

## 2017-09-17 DIAGNOSIS — G4730 Sleep apnea, unspecified: Secondary | ICD-10-CM | POA: Diagnosis not present

## 2017-09-17 DIAGNOSIS — I10 Essential (primary) hypertension: Secondary | ICD-10-CM | POA: Insufficient documentation

## 2017-09-17 DIAGNOSIS — Z87891 Personal history of nicotine dependence: Secondary | ICD-10-CM | POA: Insufficient documentation

## 2017-09-17 DIAGNOSIS — F329 Major depressive disorder, single episode, unspecified: Secondary | ICD-10-CM | POA: Diagnosis not present

## 2017-09-17 DIAGNOSIS — Z7984 Long term (current) use of oral hypoglycemic drugs: Secondary | ICD-10-CM | POA: Insufficient documentation

## 2017-09-17 DIAGNOSIS — Z79899 Other long term (current) drug therapy: Secondary | ICD-10-CM | POA: Insufficient documentation

## 2017-09-17 DIAGNOSIS — S52572A Other intraarticular fracture of lower end of left radius, initial encounter for closed fracture: Secondary | ICD-10-CM | POA: Insufficient documentation

## 2017-09-17 DIAGNOSIS — F419 Anxiety disorder, unspecified: Secondary | ICD-10-CM | POA: Diagnosis not present

## 2017-09-17 DIAGNOSIS — E119 Type 2 diabetes mellitus without complications: Secondary | ICD-10-CM | POA: Diagnosis not present

## 2017-09-17 HISTORY — DX: Sleep apnea, unspecified: G47.30

## 2017-09-17 HISTORY — PX: OPEN REDUCTION INTERNAL FIXATION (ORIF) DISTAL RADIAL FRACTURE: SHX5989

## 2017-09-17 LAB — CBC
Hematocrit: 41.2 % (ref 34.0–46.6)
Hemoglobin: 13.4 g/dL (ref 11.1–15.9)
MCH: 27.9 pg (ref 26.6–33.0)
MCHC: 32.5 g/dL (ref 31.5–35.7)
MCV: 86 fL (ref 79–97)
Platelets: 446 10*3/uL — ABNORMAL HIGH (ref 150–379)
RBC: 4.8 x10E6/uL (ref 3.77–5.28)
RDW: 13.6 % (ref 12.3–15.4)
WBC: 10.7 10*3/uL (ref 3.4–10.8)

## 2017-09-17 LAB — GLUCOSE, CAPILLARY
Glucose-Capillary: 124 mg/dL — ABNORMAL HIGH (ref 65–99)
Glucose-Capillary: 103 mg/dL — ABNORMAL HIGH (ref 65–99)

## 2017-09-17 LAB — SPECIMEN STATUS REPORT

## 2017-09-17 SURGERY — OPEN REDUCTION INTERNAL FIXATION (ORIF) DISTAL RADIUS FRACTURE
Anesthesia: General | Laterality: Left

## 2017-09-17 MED ORDER — SODIUM CHLORIDE FLUSH 0.9 % IV SOLN
INTRAVENOUS | Status: AC
  Filled 2017-09-17: qty 10

## 2017-09-17 MED ORDER — METOCLOPRAMIDE HCL 5 MG/ML IJ SOLN: 5.0000 mg | Freq: Three times a day (TID) | INTRAMUSCULAR | Status: DC | PRN

## 2017-09-17 MED ORDER — HYDROMORPHONE HCL 1 MG/ML IJ SOLN
INTRAMUSCULAR | Status: AC
  Filled 2017-09-17: qty 1

## 2017-09-17 MED ORDER — LIDOCAINE HCL (PF) 2 % IJ SOLN
INTRAMUSCULAR | Status: AC
  Filled 2017-09-17: qty 10

## 2017-09-17 MED ORDER — ACETAMINOPHEN 500 MG PO TABS
1000.0000 mg | ORAL_TABLET | Freq: Once | ORAL | Status: AC
  Administered 2017-09-17: 1000 mg via ORAL

## 2017-09-17 MED ORDER — LIDOCAINE HCL (CARDIAC) 20 MG/ML IV SOLN
INTRAVENOUS | Status: DC | PRN
  Administered 2017-09-17: 50 mg via INTRAVENOUS

## 2017-09-17 MED ORDER — FENTANYL CITRATE (PF) 100 MCG/2ML IJ SOLN
INTRAMUSCULAR | Status: AC
  Administered 2017-09-17: 50 ug via INTRAVENOUS
  Filled 2017-09-17: qty 2

## 2017-09-17 MED ORDER — FENTANYL CITRATE (PF) 100 MCG/2ML IJ SOLN
50.0000 ug | Freq: Once | INTRAMUSCULAR | Status: AC
  Administered 2017-09-17 (×2): 50 ug via INTRAVENOUS

## 2017-09-17 MED ORDER — LIDOCAINE HCL (PF) 1 % IJ SOLN
INTRAMUSCULAR | Status: AC
  Filled 2017-09-17: qty 5

## 2017-09-17 MED ORDER — BACITRACIN 50000 UNITS IM SOLR
INTRAMUSCULAR | Status: AC
  Filled 2017-09-17: qty 1

## 2017-09-17 MED ORDER — PROPOFOL 500 MG/50ML IV EMUL
INTRAVENOUS | Status: DC | PRN
  Administered 2017-09-17: 100 ug/kg/min via INTRAVENOUS

## 2017-09-17 MED ORDER — GABAPENTIN 300 MG PO CAPS
ORAL_CAPSULE | ORAL | Status: AC
  Administered 2017-09-17: 300 mg via ORAL
  Filled 2017-09-17: qty 1

## 2017-09-17 MED ORDER — CEFAZOLIN SODIUM-DEXTROSE 2-4 GM/100ML-% IV SOLN
INTRAVENOUS | Status: AC
  Filled 2017-09-17: qty 100

## 2017-09-17 MED ORDER — PHENYLEPHRINE HCL 10 MG/ML IJ SOLN
INTRAMUSCULAR | Status: AC
  Filled 2017-09-17: qty 1

## 2017-09-17 MED ORDER — SODIUM CHLORIDE 0.9 % IR SOLN
Status: DC | PRN
  Administered 2017-09-17: 20 mL

## 2017-09-17 MED ORDER — ONDANSETRON HCL 4 MG/2ML IJ SOLN: 4.0000 mg | Freq: Four times a day (QID) | INTRAMUSCULAR | Status: DC | PRN

## 2017-09-17 MED ORDER — PROPOFOL 10 MG/ML IV BOLUS
INTRAVENOUS | Status: AC
  Filled 2017-09-17: qty 20

## 2017-09-17 MED ORDER — TRAMADOL HCL 50 MG PO TABS: 50.0000 mg | ORAL_TABLET | Freq: Four times a day (QID) | ORAL | 0 refills | Status: DC | PRN

## 2017-09-17 MED ORDER — PROPOFOL 10 MG/ML IV BOLUS
INTRAVENOUS | Status: DC | PRN
  Administered 2017-09-17: 30 mg via INTRAVENOUS
  Administered 2017-09-17: 50 mg via INTRAVENOUS

## 2017-09-17 MED ORDER — GABAPENTIN 300 MG PO CAPS
300.0000 mg | ORAL_CAPSULE | Freq: Once | ORAL | Status: AC
  Administered 2017-09-17: 300 mg via ORAL

## 2017-09-17 MED ORDER — DOCUSATE SODIUM 100 MG PO CAPS: 100.0000 mg | ORAL_CAPSULE | Freq: Every day | ORAL | 2 refills | Status: DC | PRN

## 2017-09-17 MED ORDER — PROPOFOL 500 MG/50ML IV EMUL
INTRAVENOUS | Status: AC
  Filled 2017-09-17: qty 50

## 2017-09-17 MED ORDER — MIDAZOLAM HCL 2 MG/2ML IJ SOLN
INTRAMUSCULAR | Status: AC
  Administered 2017-09-17: 1 mg via INTRAVENOUS
  Filled 2017-09-17: qty 2

## 2017-09-17 MED ORDER — CHLORHEXIDINE GLUCONATE 4 % EX LIQD: 60.0000 mL | Freq: Once | CUTANEOUS | Status: DC

## 2017-09-17 MED ORDER — ACETAMINOPHEN 500 MG PO TABS
ORAL_TABLET | ORAL | Status: AC
  Administered 2017-09-17: 1000 mg via ORAL
  Filled 2017-09-17: qty 2

## 2017-09-17 MED ORDER — ROPIVACAINE HCL 5 MG/ML IJ SOLN
INTRAMUSCULAR | Status: AC
Start: 2017-09-17 — End: 2017-09-17
  Filled 2017-09-17: qty 30

## 2017-09-17 MED ORDER — FENTANYL CITRATE (PF) 100 MCG/2ML IJ SOLN
25.0000 ug | INTRAMUSCULAR | Status: DC | PRN
  Administered 2017-09-17: 50 ug via INTRAVENOUS

## 2017-09-17 MED ORDER — LACTATED RINGERS IV SOLN
INTRAVENOUS | Status: DC
  Administered 2017-09-17: 11:00:00 via INTRAVENOUS

## 2017-09-17 MED ORDER — ROPIVACAINE HCL 5 MG/ML IJ SOLN
INTRAMUSCULAR | Status: DC | PRN
Start: 2017-09-17 — End: 2017-09-17
  Administered 2017-09-17: 10 mL via PERINEURAL
  Administered 2017-09-17: 20 mL via PERINEURAL

## 2017-09-17 MED ORDER — HYDROMORPHONE HCL 1 MG/ML IJ SOLN
0.5000 mg | INTRAMUSCULAR | Status: DC | PRN
  Administered 2017-09-17: 1 mg via INTRAVENOUS

## 2017-09-17 MED ORDER — MELOXICAM 15 MG PO TABS: 15.0000 mg | ORAL_TABLET | Freq: Every day | ORAL | 2 refills | Status: AC

## 2017-09-17 MED ORDER — LACTATED RINGERS IV SOLN: INTRAVENOUS | Status: DC

## 2017-09-17 MED ORDER — MIDAZOLAM HCL 2 MG/2ML IJ SOLN
1.0000 mg | Freq: Once | INTRAMUSCULAR | Status: AC
  Administered 2017-09-17: 1 mg via INTRAVENOUS

## 2017-09-17 MED ORDER — ONDANSETRON HCL 4 MG PO TABS: 4.0000 mg | ORAL_TABLET | Freq: Four times a day (QID) | ORAL | Status: DC | PRN

## 2017-09-17 MED ORDER — METOCLOPRAMIDE HCL 10 MG PO TABS: 5.0000 mg | ORAL_TABLET | Freq: Three times a day (TID) | ORAL | Status: DC | PRN

## 2017-09-17 MED ORDER — LIDOCAINE HCL (PF) 1 % IJ SOLN
INTRAMUSCULAR | Status: DC | PRN
Start: 2017-09-17 — End: 2017-09-17
  Administered 2017-09-17: 1 mL via SUBCUTANEOUS

## 2017-09-17 SURGICAL SUPPLY — 52 items
BANDAGE ELASTIC 2 CLIP ST LF (GAUZE/BANDAGES/DRESSINGS) IMPLANT
BANDAGE ELASTIC 3 CLIP ST LF (GAUZE/BANDAGES/DRESSINGS) ×2 IMPLANT
BIT DRILL 2.2 SS TIBIAL (BIT) ×2 IMPLANT
BNDG ESMARK 4X12 TAN STRL LF (GAUZE/BANDAGES/DRESSINGS) ×2 IMPLANT
BRUSH SCRUB EZ  4% CHG (MISCELLANEOUS) ×1
BRUSH SCRUB EZ 4% CHG (MISCELLANEOUS) ×1 IMPLANT
CANISTER SUCT 1200ML W/VALVE (MISCELLANEOUS) ×2 IMPLANT
CAST PADDING 3X4FT ST 30246 (SOFTGOODS) ×1
CHLORAPREP W/TINT 26ML (MISCELLANEOUS) ×4 IMPLANT
CORD BIP STRL DISP 12FT (MISCELLANEOUS) ×2 IMPLANT
CUFF TOURN 18 STER (MISCELLANEOUS) IMPLANT
CUFF TOURN 24 STER (MISCELLANEOUS) ×2 IMPLANT
DRAPE FLUOR MINI C-ARM 54X84 (DRAPES) ×2 IMPLANT
DRAPE SHEET LG 3/4 BI-LAMINATE (DRAPES) ×2 IMPLANT
DRAPE SURG 17X11 SM STRL (DRAPES) ×2 IMPLANT
ELECT REM PT RETURN 9FT ADLT (ELECTROSURGICAL) ×2
ELECTRODE REM PT RTRN 9FT ADLT (ELECTROSURGICAL) ×1 IMPLANT
FORCEPS JEWEL BIP 4-3/4 STR (INSTRUMENTS) ×2 IMPLANT
GAUZE PETRO XEROFOAM 1X8 (MISCELLANEOUS) ×2 IMPLANT
GAUZE SPONGE 4X4 12PLY STRL (GAUZE/BANDAGES/DRESSINGS) ×2 IMPLANT
GLOVE INDICATOR 8.0 STRL GRN (GLOVE) ×2 IMPLANT
GLOVE SURG ORTHO 8.0 STRL STRW (GLOVE) ×4 IMPLANT
GOWN STRL REUS W/ TWL LRG LVL3 (GOWN DISPOSABLE) ×1 IMPLANT
GOWN STRL REUS W/ TWL XL LVL3 (GOWN DISPOSABLE) ×1 IMPLANT
GOWN STRL REUS W/TWL LRG LVL3 (GOWN DISPOSABLE) ×1
GOWN STRL REUS W/TWL XL LVL3 (GOWN DISPOSABLE) ×1
K-WIRE 1.6 (WIRE) ×1
K-WIRE FX5X1.6XNS BN SS (WIRE) ×1
KIT TURNOVER KIT A (KITS) ×2 IMPLANT
KWIRE FX5X1.6XNS BN SS (WIRE) ×1 IMPLANT
NS IRRIG 1000ML POUR BTL (IV SOLUTION) ×2 IMPLANT
PACK EXTREMITY ARMC (MISCELLANEOUS) ×2 IMPLANT
PAD ABD DERMACEA PRESS 5X9 (GAUZE/BANDAGES/DRESSINGS) ×2 IMPLANT
PAD CAST CTTN 3X4 STRL (SOFTGOODS) ×1 IMPLANT
PLATE STANDARD DVR LEFT (Plate) ×2 IMPLANT
PLATE STD DVR LT 24X51 (Plate) ×1 IMPLANT
SCREW LOCK 14X2.7X 3 LD TPR (Screw) ×1 IMPLANT
SCREW LOCK 16X2.7X 3 LD TPR (Screw) ×4 IMPLANT
SCREW LOCK 18X2.7X 3 LD TPR (Screw) ×2 IMPLANT
SCREW LOCKING 2.7X14 (Screw) ×1 IMPLANT
SCREW LOCKING 2.7X15MM (Screw) ×2 IMPLANT
SCREW LOCKING 2.7X16 (Screw) ×4 IMPLANT
SCREW LOCKING 2.7X18 (Screw) ×2 IMPLANT
SCREW NONLOCK 2.7X18MM (Screw) ×2 IMPLANT
SCREW NONLOCK 2.7X20MM (Screw) ×2 IMPLANT
SPLINT CAST 1 STEP 3X12 (MISCELLANEOUS) ×2 IMPLANT
STAPLER SKIN PROX 35W (STAPLE) ×2 IMPLANT
STOCKINETTE STRL 4IN 9604848 (GAUZE/BANDAGES/DRESSINGS) ×2 IMPLANT
SUT ETHILON 3 0 PS 1 (SUTURE) ×2 IMPLANT
SUT VIC AB 3-0 SH 27 (SUTURE) ×2
SUT VIC AB 3-0 SH 27X BRD (SUTURE) ×2 IMPLANT
TOWEL OR 17X26 4PK STRL BLUE (TOWEL DISPOSABLE) ×2 IMPLANT

## 2017-09-17 NOTE — Discharge Instructions (Signed)
AMBULATORY SURGERY  DISCHARGE INSTRUCTIONS   1) The drugs that you were given will stay in your system until tomorrow so for the next 24 hours you should not:  A) Drive an automobile B) Make any legal decisions C) Drink any alcoholic beverage   2) You may resume regular meals tomorrow.  Today it is better to start with liquids and gradually work up to solid foods.  You may eat anything you prefer, but it is better to start with liquids, then soup and crackers, and gradually work up to solid foods.   3) Please notify your doctor immediately if you have any unusual bleeding, trouble breathing, redness and pain at the surgery site, drainage, fever, or pain not relieved by medication. 4)   5) Your post-operative visit with Dr.                                     is: Date:                        Time:    Please call to schedule your post-operative visit.  6) Additional Instructions:        Patient should elevate the right forearm continuously for the next 72 hours. Patient's wrist should remain above her heart. She may flex and extend her fingers as tolerated. Patient may apply a bag of ice to the right wrist. Keep the dressing clean dry and intact until her follow-up in the office. Patient should follow up in 10 to 14 days. She should avoid any weightbearing or lifting with the right arm until follow-up. She should cover her dressing with a plastic bag to shower. Patient should contact the doctor immediately if she has loss of sensation, loss of motion, loss of circulation or extreme pain in the right wrist.  Dr. Harlow Mares' office phone number is [phone].

## 2017-09-17 NOTE — Transfer of Care (Signed)
Immediate Anesthesia Transfer of Care Note  Patient: Angelica Medina  Procedure(s) Performed: OPEN REDUCTION INTERNAL FIXATION (ORIF) DISTAL RADIAL FRACTURE (Left )  Patient Location: PACU  Anesthesia Type:General  Level of Consciousness: awake and responds to stimulation  Airway & Oxygen Therapy: Patient Spontanous Breathing and Patient connected to face mask oxygen  Post-op Assessment: Report given to RN and Post -op Vital signs reviewed and stable  Post vital signs: Reviewed and stable  Last Vitals:  Vitals:   09/17/17 1340 09/17/17 1525  BP: 113/78 123/77  Pulse: 69 81  Resp: 18 19  Temp:    SpO2: 97% 96%    Last Pain:  Vitals:   09/17/17 1310  TempSrc:   PainSc: Asleep         Complications: No apparent anesthesia complications

## 2017-09-17 NOTE — Anesthesia Procedure Notes (Signed)
Anesthesia Regional Block: Supraclavicular block   Pre-Anesthetic Checklist: ,, timeout performed, Correct Patient, Correct Site, Correct Laterality, Correct Procedure, Correct Position, site marked, Risks and benefits discussed,  Surgical consent,  Pre-op evaluation,  At surgeon's request and post-op pain management  Laterality: Upper and Left  Prep: chloraprep       Needles:  Injection technique: Single-shot  Needle Type: Stimiplex     Needle Length: 5cm  Needle Gauge: 22     Additional Needles:   Narrative:  Start time: 09/17/2017 12:23 PM End time: 09/17/2017 12:30 PM Injection made incrementally with aspirations every 5 mL.  Performed by: Personally  Anesthesiologist: Piscitello, Precious Haws, MD  Additional Notes: Functioning IV was confirmed and monitors were applied.  A 55mm 22ga Stimuplex needle was used. Sterile prep,hand hygiene and sterile gloves were used.  Minimal sedation used for procedure.  No paresthesia endorsed by patient during the procedure.  Negative aspiration and negative test dose prior to incremental administration of local anesthetic. The patient tolerated the procedure well with no immediate complications.

## 2017-09-17 NOTE — Anesthesia Postprocedure Evaluation (Signed)
Anesthesia Post Note  Patient: Angelica Medina  Procedure(s) Performed: OPEN REDUCTION INTERNAL FIXATION (ORIF) DISTAL RADIAL FRACTURE (Left )  Patient location during evaluation: PACU Anesthesia Type: General Level of consciousness: awake and alert Pain management: pain level controlled Vital Signs Assessment: post-procedure vital signs reviewed and stable Respiratory status: spontaneous breathing, nonlabored ventilation, respiratory function stable and patient connected to nasal cannula oxygen Cardiovascular status: blood pressure returned to baseline and stable Postop Assessment: no apparent nausea or vomiting Anesthetic complications: no     Last Vitals:  Vitals:   09/17/17 1554 09/17/17 1601  BP: 119/82   Pulse: 65 64  Resp: 12 (!) 9  Temp:    SpO2: 91% 93%    Last Pain:  Vitals:   09/17/17 1601  TempSrc:   PainSc: 5                  Molli Barrows

## 2017-09-17 NOTE — Anesthesia Procedure Notes (Signed)
Performed by: Lance Muss, CRNA Pre-anesthesia Checklist: Patient identified, Emergency Drugs available, Suction available, Timeout performed and Patient being monitored Patient Re-evaluated:Patient Re-evaluated prior to induction Oxygen Delivery Method: Simple face mask Induction Type: IV induction

## 2017-09-17 NOTE — Telephone Encounter (Signed)
-----   Message from Virginia Crews, MD sent at 09/17/2017  9:04 AM EDT ----- Stable blood counts.  Platelet count does run high.  Virginia Crews, MD, MPH [facility] 09/17/2017 9:04 AM

## 2017-09-17 NOTE — H&P (Signed)
The patient has been re-examined, and the chart reviewed, and there have been no interval changes to the documented history and physical.  Plan a left wrist open reduction and internal fixation today.  Anesthesia is consulted regarding a peripheral nerve block for post-operative pain.  The risks, benefits, and alternatives have been discussed at length, and the patient is willing to proceed.

## 2017-09-17 NOTE — Anesthesia Post-op Follow-up Note (Signed)
Anesthesia QCDR form completed.        

## 2017-09-17 NOTE — Telephone Encounter (Signed)
Pt advised.

## 2017-09-17 NOTE — Op Note (Signed)
09/17/2017  3:27 PM  PATIENT:  Angelica Medina    PRE-OPERATIVE DIAGNOSIS:  Fracture of the lower end of left radius  POST-OPERATIVE DIAGNOSIS:  Same  PROCEDURE:  OPEN REDUCTION INTERNAL FIXATION (ORIF) DISTAL RADIAL FRACTURE, LEFT  SURGEON:  Lovell Sheehan, MD  ASSIST: none  TOURNIQUET TIME:  42  MIN  ANESTHESIA:   General  PREOPERATIVE INDICATIONS:  Angelica Medina is a  57 y.o. female with a diagnosis of Fracture of the lower end of left radius who failed conservative measures and elected for surgical management.    The risks benefits and alternatives were discussed with the patient preoperatively including but not limited to the risks of infection, bleeding, nerve injury, malunion, nonunion, wrist stiffness, persistent wrist pain, osteoarthritis and the need for further surgery. Medical risks include but are not limited to DVT and pulmonary embolism, myocardial infarction, stroke, pneumonia, respiratory failure and death. Patient  understood these risks and wished to proceed.   OPERATIVE IMPLANTS:  hand innovations plate  OPERATIVE FINDINGS: complex, displaced, closed intra-articular distal radius fracture, left  OPERATIVE PROCEDURE: Patient was seen in the preoperative area. I marked the operative wrist according tl the hospital's correct site of surgery protocol. Patient was then brought to the operating roomand was placed supine on the operative table and underwent general anesthesia with an LMA.   The operative arm was prepped and draped in a sterile fashion. A timeout performed to verify the patient's name, date of birth, medical record number, correct site of surgery correct procedure to be performed. The timeout was also used a timeout to verify patient received antibiotics and appropriate instruments, implants and radiographs studies were available in the room. Once all in attendance were in agreement case began.   Patient then had the operative extremity exsanguinated with an  Esmarch. The tourniquet was placed on the upper extremity and inflated 250 mm.  A manual reduction of the fracture was performed. The fracture reduction was confirmed on FluoroScan imaging.  A linear incision was then made over the FCR tendon. The subcutaneous tissue was carefully dissected using Metzenbaum scissor and Adson pickup. Retractors were used to protect the radial artery and median nerve. The pronator quadratus was identified and incised and elevated off the volar surface of the distal radius. A [employer] volar plate was then positioned on the under surface of the distal radius. It was held into position with a K wire. The position of the plate was confirmed on AP and lateral images. Once the plate was in good position a cortical screw was placed bicortically in the sliding hole. Attention was then turned to the distal pegs. The proximal row of pegs was placed first. Each individual peg hole was drilled and then measured with a depth gauge. The distal row was then drilled and smooth pegs were placed. The position and length of all screws were confirmed on AP and lateral FluoroScan imaging. Care was taken to avoid penetration of any peg through the articular surface of the distal radius.  Once all distal pegs were placed, the attention was turned back to placement of bicortical shaft screws. Additional screws were placed in the plate to fill the remaining holes. The wound was then copiously irrigated. Final FluoroScan imaging of the construct were taken. The fracture was in anatomic position and the hardware was well-positioned. The wound again was copiously irrigated. The soft tissue was then carefully over the plate. The skin was closed with staples. Xeroform and a dry sterile dressing were  applied along with a volar splint. I was scrubbed and present for the entire case and all sharp and instrument counts were correct at the conclusion the case. The patient tolerated this procedure well and was  awakened and taken to the recovery room in good condition.   Elyn Aquas. Harlow Mares, MD

## 2017-09-17 NOTE — Anesthesia Preprocedure Evaluation (Signed)
Anesthesia Evaluation  Patient identified by MRN, date of birth, ID band Patient awake    Reviewed: Allergy & Precautions, H&P , NPO status , Patient's Chart, lab work & pertinent test results  History of Anesthesia Complications Negative for: history of anesthetic complications  Airway Mallampati: III  TM Distance: <3 FB Neck ROM: full    Dental  (+) Chipped   Pulmonary neg shortness of breath, sleep apnea and Continuous Positive Airway Pressure Ventilation , former smoker,           Cardiovascular Exercise Tolerance: Good hypertension, (-) angina(-) Past MI and (-) DOE      Neuro/Psych PSYCHIATRIC DISORDERS Anxiety Depression negative neurological ROS     GI/Hepatic negative GI ROS, Neg liver ROS,   Endo/Other  diabetes, Type 2  Renal/GU negative Renal ROS  negative genitourinary   Musculoskeletal   Abdominal   Peds  Hematology negative hematology ROS (+)   Anesthesia Other Findings Past Medical History: No date: Anxiety No date: Depression 2016: Hypertension No date: Sleep apnea 2016: T2DM (type 2 diabetes mellitus) ([place])     Comment:  diet controlled  Past Surgical History: 1996: BACK SURGERY     Comment:  for herniated disc, no hardware 2009: BREAST BIOPSY; Left     Comment:  benign No date: ECTOPIC PREGNANCY SURGERY 2014: ESOPHAGEAL DILATION     Comment:  has had several i nthe past for achalasia  BMI    Body Mass Index:  36.11 kg/m      Reproductive/Obstetrics negative OB ROS                             Anesthesia Physical Anesthesia Plan  ASA: III  Anesthesia Plan: General   Post-op Pain Management: GA combined w/ Regional for post-op pain   Induction: Intravenous  PONV Risk Score and Plan: Ondansetron, Dexamethasone, Midazolam, Propofol infusion and TIVA  Airway Management Planned: Natural Airway and Nasal Cannula  Additional Equipment:   Intra-op  Plan:   Post-operative Plan:   Informed Consent: I have reviewed the patients History and Physical, chart, labs and discussed the procedure including the risks, benefits and alternatives for the proposed anesthesia with the patient or authorized representative who has indicated his/her understanding and acceptance.   Dental Advisory Given  Plan Discussed with: Anesthesiologist, CRNA and Surgeon  Anesthesia Plan Comments: (Patient consented for risks of anesthesia including but not limited to:  - adverse reactions to medications - risk of intubation if required - damage to teeth, lips or other oral mucosa - sore throat or hoarseness - Damage to heart, brain, lungs or loss of life  Patient voiced understanding.)        Anesthesia Quick Evaluation

## 2017-09-18 ENCOUNTER — Encounter: Payer: Self-pay | Admitting: Orthopedic Surgery

## 2017-09-23 ENCOUNTER — Ambulatory Visit: Payer: 59 | Admitting: Family Medicine

## 2017-09-28 ENCOUNTER — Other Ambulatory Visit: Payer: Self-pay | Admitting: Family Medicine

## 2017-10-08 ENCOUNTER — Other Ambulatory Visit: Payer: Self-pay | Admitting: Family Medicine

## 2017-10-08 NOTE — Telephone Encounter (Signed)
Can you see what dose she landed on with the taper?  I'm happy to refill that for 30 or 90 day supply - whatever patient wants.  Virginia Crews, MD, MPH [facility] 10/08/2017 11:58 AM

## 2017-10-08 NOTE — Telephone Encounter (Signed)
Tried calling pt. NA. 

## 2017-10-09 NOTE — Telephone Encounter (Signed)
Call on cell phone please.

## 2017-10-09 NOTE — Telephone Encounter (Signed)
Can go back to 40mg  daily.  Rx sent  Virginia Crews, MD, MPH [facility] 10/09/2017 8:22 AM

## 2017-10-09 NOTE — Telephone Encounter (Signed)
Patient is taking 60 mg. Prozac however she said she feels like a "zombie", cant sleep at night.   She wants to know should she taper back since she is having this effect?

## 2017-10-10 NOTE — Telephone Encounter (Signed)
Pt advised and agrees with treatment plan. 

## 2017-10-21 ENCOUNTER — Ambulatory Visit: Payer: 59 | Admitting: Psychiatry

## 2017-10-21 ENCOUNTER — Encounter: Payer: Self-pay | Admitting: Psychiatry

## 2017-10-21 ENCOUNTER — Other Ambulatory Visit: Payer: Self-pay

## 2017-10-21 VITALS — BP 135/83 | HR 84 | Temp 98.4°F | Wt 233.4 lb

## 2017-10-21 DIAGNOSIS — F411 Generalized anxiety disorder: Secondary | ICD-10-CM | POA: Diagnosis not present

## 2017-10-21 DIAGNOSIS — G4700 Insomnia, unspecified: Secondary | ICD-10-CM | POA: Diagnosis not present

## 2017-10-21 DIAGNOSIS — F330 Major depressive disorder, recurrent, mild: Secondary | ICD-10-CM

## 2017-10-21 MED ORDER — TRAZODONE HCL 50 MG PO TABS: 25.0000 mg | ORAL_TABLET | Freq: Every evening | ORAL | 1 refills | Status: DC | PRN

## 2017-10-21 MED ORDER — BUSPIRONE HCL 10 MG PO TABS: 5.0000 mg | ORAL_TABLET | Freq: Two times a day (BID) | ORAL | 1 refills | Status: DC

## 2017-10-21 NOTE — Progress Notes (Signed)
Psychiatric Initial Adult Assessment   Patient Identification: Angelica Medina MRN:  [id number] Date of Evaluation:  10/21/2017 Referral Source:Lavon Paganini MD Chief Complaint:  ' I want medication help." Chief Complaint    Establish Care; Anxiety; Depression     Visit Diagnosis:    ICD-10-CM   1. GAD (generalized anxiety disorder) F41.1 busPIRone (BUSPAR) 10 MG tablet  2. MDD (major depressive disorder), recurrent episode, mild (HCC) F33.0 busPIRone (BUSPAR) 10 MG tablet    traZODone (DESYREL) 50 MG tablet  3. Insomnia, unspecified type G47.00 traZODone (DESYREL) 50 MG tablet    History of Present Illness:  Angelica Medina is a 57 year old Caucasian female, unemployed, lives in [place], has a history of depression, presented to the clinic today to establish care.  Patient reports she was diagnosed with depression 20 years ago.  She reports however she was being treated by her primary medical doctor.  She denies any inpatient mental health admissions.  She reports she has tried several medications in the SSRI/SNRI class previously.  She reports she either did not tolerate it or they did not help her.  She is currently on Prozac.  She reports she tried Prozac 40 and then 60 mg few weeks ago.  She however felt like a zombie when she tried the higher dose.  She hence is currently on Prozac 20 mg.  She reports the Prozac 20 mg may be helping to some extent.  She however struggles with sleep issues.  She reports her sleep problems started after she started taking the Prozac in the evening.  She has been on the Prozac since the past few weeks now.  She however reports she has also tried Prozac  several years ago.  She describes her depressive symptoms as lack of motivation, anhedonia, feeling down, difficulty falling asleep or staying asleep, feeling tired, poor appetite, trouble concentrating and so on.  She denies any suicidality.  She denies any perceptual disturbances.  Patient does report  anxiety symptoms.  She reports she worries about a lot of stuff.  She feels nervous, anxious or on edge all the time.  She also reports becoming easily annoyed or irritable.  She reports she has trouble relaxing and so on.  She denies history of sexual or physical abuse.  She denies any history of trauma.  She denies bipolar symptoms.  She denies any OCD symptoms.  She denies any abusing any drugs or alcohol.  She reports she relocated to [place] almost a year ago from [place].  She has a daughter and son and a grandchild who lives here.  She has support from her husband who is still employed.  She is retired.    Associated Signs/Symptoms: Depression Symptoms:  depressed mood, difficulty concentrating, anxiety, disturbed sleep, (Hypo) Manic Symptoms:  denies Anxiety Symptoms:  Excessive Worry, Psychotic Symptoms:  denies PTSD Symptoms: Negative  Past Psychiatric History: Patient reports history of depression and anxiety since the past 20 years or so.  She denies any suicidality.  She denies any perceptual disturbances.  She has tried medications in the past.  Recently her medications were being prescribed by her primary medical doctor  Previous Psychotropic Medications: Yes -lexapro, Cymbalta, Paxil, Wellbutrin, Prozac  Substance Abuse History in the last 12 months:  No.  Consequences of Substance Abuse: Negative  Past Medical History:  Past Medical History:  Diagnosis Date  . Anxiety   . Depression   . Hypertension 2016  . Sleep apnea   . T2DM (type 2 diabetes mellitus) ([place])  2016   diet controlled    Past Surgical History:  Procedure Laterality Date  . [place]   for herniated disc, no hardware  . BREAST BIOPSY Left 2009   benign  . ECTOPIC PREGNANCY SURGERY    . ESOPHAGEAL DILATION  2014   has had several i nthe past for achalasia  . OPEN REDUCTION INTERNAL FIXATION (ORIF) DISTAL RADIAL FRACTURE Left 09/17/2017   Procedure: OPEN REDUCTION  INTERNAL FIXATION (ORIF) DISTAL RADIAL FRACTURE;  Surgeon: Lovell Sheehan, MD;  Location: [facility] ORS;  Service: Orthopedics;  Laterality: Left;    Family Psychiatric History: Grandmother-schizophrenia  Family History:  Family History  Problem Relation Age of Onset  . Thyroid cancer Mother   . Anxiety disorder Mother   . Depression Mother   . Depression Father   . Diabetes Father   . COPD Father   . Anxiety disorder Father   . Hypothyroidism Sister   . Anxiety disorder Sister   . Healthy Brother   . Anxiety disorder Brother   . Anxiety disorder Brother   . Anxiety disorder Sister   . Depression Sister   . Drug abuse Paternal Aunt   . Alcohol abuse Maternal Grandfather   . Alcohol abuse Paternal Grandfather   . Alcohol abuse Paternal Grandmother   . Drug abuse Other     Social History:   Social History   Socioeconomic History  . Marital status: Married    Spouse name: Angelica Medina  . Number of children: 3  . Years of education: 51  . Highest education level: High school graduate  Occupational History  . 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 Financial resource strain: Not hard at all  . Food insecurity:    Worry: Never true    Inability: Never true  . Transportation needs:    Medical: No    Non-medical: No  Tobacco Use  . Smoking status: Former Smoker    Packs/day: 0.50    Years: 20.00    Pack years: 10.00    Last attempt to quit: 07/01/1994    Years since quitting: 23.3  . Smokeless tobacco: Never Used  Substance and Sexual Activity  . Alcohol use: No  . Drug use: No  . Sexual activity: Not Currently  Lifestyle  . Physical activity:    Days per week: 0 days    Minutes per session: 0 min  . Stress: Rather much  Relationships  . Social connections:    Talks on phone: Three times a week    Gets together: More than three times a week    Attends religious service: More than 4 times per year    Active member of club or organization: No    Attends  meetings of clubs or organizations: Never    Relationship status: Married  Other Topics Concern  . Not on file  Social History Narrative  . Not on file    Additional Social History: Patient is married.  She is retired.  She used to work in Scientist, research (medical), Nurse, learning disability, as school bus driver and so on in the past.  She relocated to [place] from [place] in May 2018.  Her husband is employed.  She has 3 children all adults.  Two of her children lives in [place].  She reports she spends her time taking care of her grandchild who is 79 years old.  Allergies:   Allergies  Allergen Reactions  . Oxycodone Itching  . Wellbutrin [Bupropion]  Anxiety    Metabolic Disorder Labs: Lab Results  Component Value Date   HGBA1C 7.2 06/28/2017   No results found for: PROLACTIN Lab Results  Component Value Date   CHOL 120 07/26/2017   TRIG 94 07/26/2017   HDL 43 07/26/2017   CHOLHDL 2.8 07/26/2017   LDLCALC 58 07/26/2017   LDLCALC 140 (H) 02/28/2017     Current Medications: Current Outpatient Medications  Medication Sig Dispense Refill  . acetaminophen (TYLENOL) 650 MG CR tablet Take 650 mg by mouth every 8 (eight) hours as needed for pain.    Marland Kitchen atorvastatin (LIPITOR) 20 MG tablet Take 1 tablet (20 mg total) by mouth daily. 90 tablet 2  . b complex vitamins capsule Take 1 capsule by mouth daily.    Marland Kitchen FLUoxetine (PROZAC) 20 MG capsule fluoxetine 20 mg tablet    . lisinopril (PRINIVIL,ZESTRIL) 5 MG tablet Take 5 mg by mouth daily.    . meloxicam (MOBIC) 15 MG tablet Take 1 tablet (15 mg total) by mouth daily. 30 tablet 2  . metFORMIN (GLUCOPHAGE) 500 MG tablet TAKE ONE TABLET BY MOUTH TWICE A DAY WITH A MEAL 60 tablet 3  . Multiple Vitamin (MULTIVITAMIN) tablet Take 1 tablet by mouth daily.    . Omega-3 1000 MG CAPS Take 1 tablet by mouth daily.    . busPIRone (BUSPAR) 10 MG tablet Take 0.5 tablets (5 mg total) by mouth 2 (two) times daily. 30 tablet 1  . traZODone (DESYREL) 50 MG tablet Take  0.5-1 tablets (25-50 mg total) by mouth at bedtime as needed for sleep. 30 tablet 1   No current facility-administered medications for this visit.     Neurologic: Headache: No Seizure: No Paresthesias:No  Musculoskeletal: Strength & Muscle Tone: within normal limits Gait & Station: normal Patient leans: N/A  Psychiatric Specialty Exam: Review of Systems  Psychiatric/Behavioral: Positive for depression. The patient is nervous/anxious and has insomnia.   All other systems reviewed and are negative.   Blood pressure 135/83, pulse 84, temperature 98.4 F (36.9 C), temperature source Oral, weight 233 lb 6.4 oz (105.9 kg).Body mass index is 36.02 kg/m.  General Appearance: Casual  Eye Contact:  Fair  Speech:  Clear and Coherent  Volume:  Normal  Mood:  Anxious and Dysphoric  Affect:  Congruent  Thought Process:  Goal Directed and Descriptions of Associations: Intact  Orientation:  Full (Time, Place, and Person)  Thought Content:  Logical  Suicidal Thoughts:  No  Homicidal Thoughts:  No  Memory:  Immediate;   Fair Recent;   Fair Remote;   Fair  Judgement:  Fair  Insight:  Fair  Psychomotor Activity:  Normal  Concentration:  Concentration: Fair and Attention Span: Fair  Recall:  [employer] of Knowledge:Fair  Language: Fair  Akathisia:  No  Handed:  Right  AIMS (if indicated):  NA  Assets:  Communication Skills Desire for Improvement Housing Intimacy Social Support Transportation  ADL's:  Intact  Cognition: WNL  Sleep:  restless    Treatment Plan Summary: Jhada is a 57 year old Caucasian female, retired, lives in [place], has a history of depression, OSA, diabetes mellitus, hyperlipidemia, hypertension, presented to the clinic today to establish care.  Patient has biological predisposition given her family history of mental health issues.  She continues to struggle with depressive symptoms as well as anxiety symptoms and sleep issues.  She has failed or developed  side effects to several medications in the past.  She is currently tolerating the lower dose  of Prozac well but continues to have some anxiety and depressive symptoms as noted above.  Discussed medication changes with patient.  Plan as noted below. Medication management and Plan as noted below Plan  MDD Continue Prozac 20 mg p.o. daily.  Discussed with her to try taking the Prozac in the morning. Add BuSpar 5 mg p.o. twice daily. PHQ 9 equals 16  For GAD Continue Prozac as scheduled. Add BuSpar 5 mg p.o. twice daily GAD 7 equals 9  For insomnia Discussed changing the time she takes Prozac to a.m. Will start trazodone 25-50 mg p.o. nightly as needed She has OSA, compliant on CPAP For sleep hygiene techniques.  Provided handouts.  Will refer patient for psychotherapy with Ms. Peacock.  I have reviewed thyroid labs done by her primary medical doctor-in EHR- 02/28/2017 - wnl.  Provided medication education, provided handouts.  Follow-up in clinic in 4 weeks or sooner if needed.  More than 50 % of the time was spent for psychoeducation and supportive psychotherapy and care coordination.  This note was generated in part or whole with voice recognition software. Voice recognition is usually quite accurate but there are transcription errors that can and very often do occur. I apologize for any typographical errors that were not detected and corrected.      Ursula Alert, MD 4/22/20198:04 PM

## 2017-10-21 NOTE — Patient Instructions (Signed)
Trazodone tablets What is this medicine? TRAZODONE (TRAZ oh done) is used to treat depression. This medicine may be used for other purposes; ask your health care provider or pharmacist if you have questions. COMMON BRAND NAME(S): Desyrel What should I tell my health care provider before I take this medicine? They need to know if you have any of these conditions: -attempted suicide or thinking about it -bipolar disorder -bleeding problems -glaucoma -heart disease, or previous heart attack -irregular heart beat -kidney or liver disease -low levels of sodium in the blood -an unusual or allergic reaction to trazodone, other medicines, foods, dyes or preservatives -pregnant or trying to get pregnant -breast-feeding How should I use this medicine? Take this medicine by mouth with a glass of water. Follow the directions on the prescription label. Take this medicine shortly after a meal or a light snack. Take your medicine at regular intervals. Do not take your medicine more often than directed. Do not stop taking this medicine suddenly except upon the advice of your doctor. Stopping this medicine too quickly may cause serious side effects or your condition may worsen. A special MedGuide will be given to you by the pharmacist with each prescription and refill. Be sure to read this information carefully each time. Talk to your pediatrician regarding the use of this medicine in children. Special care may be needed. Overdosage: If you think you have taken too much of this medicine contact a poison control center or emergency room at once. NOTE: This medicine is only for you. Do not share this medicine with others. What if I miss a dose? If you miss a dose, take it as soon as you can. If it is almost time for your next dose, take only that dose. Do not take double or extra doses. What may interact with this medicine? Do not take this medicine with any of the following medications: -certain medicines  for fungal infections like fluconazole, itraconazole, ketoconazole, posaconazole, voriconazole -cisapride -dofetilide -dronedarone -linezolid -MAOIs like Carbex, Eldepryl, Marplan, Nardil, and Parnate -mesoridazine -methylene blue (injected into a vein) -pimozide -saquinavir -thioridazine -ziprasidone This medicine may also interact with the following medications: -alcohol -antiviral medicines for HIV or AIDS -aspirin and aspirin-like medicines -barbiturates like phenobarbital -certain medicines for blood pressure, heart disease, irregular heart beat -certain medicines for depression, anxiety, or psychotic disturbances -certain medicines for migraine headache like almotriptan, eletriptan, frovatriptan, naratriptan, rizatriptan, sumatriptan, zolmitriptan -certain medicines for seizures like carbamazepine and phenytoin -certain medicines for sleep -certain medicines that treat or prevent blood clots like dalteparin, enoxaparin, warfarin -digoxin -fentanyl -lithium -NSAIDS, medicines for pain and inflammation, like ibuprofen or naproxen -other medicines that prolong the QT interval (cause an abnormal heart rhythm) -rasagiline -supplements like St. John's wort, kava kava, valerian -tramadol -tryptophan This list may not describe all possible interactions. Give your health care provider a list of all the medicines, herbs, non-prescription drugs, or dietary supplements you use. Also tell them if you smoke, drink alcohol, or use illegal drugs. Some items may interact with your medicine. What should I watch for while using this medicine? Tell your doctor if your symptoms do not get better or if they get worse. Visit your doctor or health care professional for regular checks on your progress. Because it may take several weeks to see the full effects of this medicine, it is important to continue your treatment as prescribed by your doctor. Patients and their families should watch out for new  or worsening thoughts of suicide or depression. Also   watch out for sudden changes in feelings such as feeling anxious, agitated, panicky, irritable, hostile, aggressive, impulsive, severely restless, overly excited and hyperactive, or not being able to sleep. If this happens, especially at the beginning of treatment or after a change in dose, call your health care professional. Dennis Bast may get drowsy or dizzy. Do not drive, use machinery, or do anything that needs mental alertness until you know how this medicine affects you. Do not stand or sit up quickly, especially if you are an older patient. This reduces the risk of dizzy or fainting spells. Alcohol may interfere with the effect of this medicine. Avoid alcoholic drinks. This medicine may cause dry eyes and blurred vision. If you wear contact lenses you may feel some discomfort. Lubricating drops may help. See your eye doctor if the problem does not go away or is severe. Your mouth may get dry. Chewing sugarless gum, sucking hard candy and drinking plenty of water may help. Contact your doctor if the problem does not go away or is severe. What side effects may I notice from receiving this medicine? Side effects that you should report to your doctor or health care professional as soon as possible: -allergic reactions like skin rash, itching or hives, swelling of the face, lips, or tongue -elevated mood, decreased need for sleep, racing thoughts, impulsive behavior -confusion -fast, irregular heartbeat -feeling faint or lightheaded, falls -feeling agitated, angry, or irritable -loss of balance or coordination -painful or prolonged erections -restlessness, pacing, inability to keep still -suicidal thoughts or other mood changes -tremors -trouble sleeping -seizures -unusual bleeding or bruising Side effects that usually do not require medical attention (report to your doctor or health care professional if they continue or are bothersome): -change in  sex drive or performance -change in appetite or weight -constipation -headache -muscle aches or pains -nausea This list may not describe all possible side effects. Call your doctor for medical advice about side effects. You may report side effects to FDA at 1-800-FDA-1088. Where should I keep my medicine? Keep out of the reach of children. Store at room temperature between 15 and 30 degrees C (59 to 86 degrees F). Protect from light. Keep container tightly closed. Throw away any unused medicine after the expiration date. NOTE: This sheet is a summary. It may not cover all possible information. If you have questions about this medicine, talk to your doctor, pharmacist, or health care provider.  2018 Elsevier/Gold Standard (2015-11-17 16:57:05) Buspirone tablets What is this medicine? BUSPIRONE (byoo SPYE rone) is used to treat anxiety disorders. This medicine may be used for other purposes; ask your health care provider or pharmacist if you have questions. COMMON BRAND NAME(S): BuSpar What should I tell my health care provider before I take this medicine? They need to know if you have any of these conditions: -kidney or liver disease -an unusual or allergic reaction to buspirone, other medicines, foods, dyes, or preservatives -pregnant or trying to get pregnant -breast-feeding How should I use this medicine? Take this medicine by mouth with a glass of water. Follow the directions on the prescription label. You may take this medicine with or without food. To ensure that this medicine always works the same way for you, you should take it either always with or always without food. Take your doses at regular intervals. Do not take your medicine more often than directed. Do not stop taking except on the advice of your doctor or health care professional. Talk to your pediatrician regarding the use  of this medicine in children. Special care may be needed. Overdosage: If you think you have taken  too much of this medicine contact a poison control center or emergency room at once. NOTE: This medicine is only for you. Do not share this medicine with others. What if I miss a dose? If you miss a dose, take it as soon as you can. If it is almost time for your next dose, take only that dose. Do not take double or extra doses. What may interact with this medicine? Do not take this medicine with any of the following medications: -linezolid -MAOIs like Carbex, Eldepryl, Marplan, Nardil, and Parnate -methylene blue -procarbazine This medicine may also interact with the following medications: -diazepam -digoxin -diltiazem -erythromycin -grapefruit juice -haloperidol -medicines for mental depression or mood problems -medicines for seizures like carbamazepine, phenobarbital and phenytoin -nefazodone -other medications for anxiety -rifampin -ritonavir -some antifungal medicines like itraconazole, ketoconazole, and voriconazole -verapamil -warfarin This list may not describe all possible interactions. Give your health care provider a list of all the medicines, herbs, non-prescription drugs, or dietary supplements you use. Also tell them if you smoke, drink alcohol, or use illegal drugs. Some items may interact with your medicine. What should I watch for while using this medicine? Visit your doctor or health care professional for regular checks on your progress. It may take 1 to 2 weeks before your anxiety gets better. You may get drowsy or dizzy. Do not drive, use machinery, or do anything that needs mental alertness until you know how this drug affects you. Do not stand or sit up quickly, especially if you are an older patient. This reduces the risk of dizzy or fainting spells. Alcohol can make you more drowsy and dizzy. Avoid alcoholic drinks. What side effects may I notice from receiving this medicine? Side effects that you should report to your doctor or health care professional as soon  as possible: -blurred vision or other vision changes -chest pain -confusion -difficulty breathing -feelings of hostility or anger -muscle aches and pains -numbness or tingling in hands or feet -ringing in the ears -skin rash and itching -vomiting -weakness Side effects that usually do not require medical attention (report to your doctor or health care professional if they continue or are bothersome): -disturbed dreams, nightmares -headache -nausea -restlessness or nervousness -sore throat and nasal congestion -stomach upset This list may not describe all possible side effects. Call your doctor for medical advice about side effects. You may report side effects to FDA at 1-800-FDA-1088. Where should I keep my medicine? Keep out of the reach of children. Store at room temperature below 30 degrees C (86 degrees F). Protect from light. Keep container tightly closed. Throw away any unused medicine after the expiration date. NOTE: This sheet is a summary. It may not cover all possible information. If you have questions about this medicine, talk to your doctor, pharmacist, or health care provider.  2018 Elsevier/Gold Standard (2010-01-26 18:06:11)

## 2017-11-11 ENCOUNTER — Ambulatory Visit: Payer: Self-pay | Admitting: Family Medicine

## 2017-11-18 ENCOUNTER — Ambulatory Visit (INDEPENDENT_AMBULATORY_CARE_PROVIDER_SITE_OTHER): Payer: 59 | Admitting: Family Medicine

## 2017-11-18 ENCOUNTER — Encounter: Payer: Self-pay | Admitting: Family Medicine

## 2017-11-18 VITALS — BP 116/78 | HR 78 | Temp 97.9°F | Resp 16 | Wt 234.0 lb

## 2017-11-18 DIAGNOSIS — F419 Anxiety disorder, unspecified: Secondary | ICD-10-CM | POA: Diagnosis not present

## 2017-11-18 DIAGNOSIS — E1142 Type 2 diabetes mellitus with diabetic polyneuropathy: Secondary | ICD-10-CM | POA: Diagnosis not present

## 2017-11-18 DIAGNOSIS — E041 Nontoxic single thyroid nodule: Secondary | ICD-10-CM | POA: Diagnosis not present

## 2017-11-18 DIAGNOSIS — F331 Major depressive disorder, recurrent, moderate: Secondary | ICD-10-CM

## 2017-11-18 DIAGNOSIS — D1722 Benign lipomatous neoplasm of skin and subcutaneous tissue of left arm: Secondary | ICD-10-CM

## 2017-11-18 NOTE — Assessment & Plan Note (Signed)
Well-controlled on Prozac and BuSpar Continue seeing psychiatry Agree with psychology which patient is planning to start

## 2017-11-18 NOTE — Assessment & Plan Note (Signed)
Well-controlled Continue current meds Continue seeing psychiatry and encouraged psychology

## 2017-11-18 NOTE — Assessment & Plan Note (Signed)
Small pea-sized lipoma of left upper extremity that is mobile and nontender Reassurance given Monitor and if becomes tender or is growing significantly, could consider surgery referral for possible removal

## 2017-11-18 NOTE — Assessment & Plan Note (Signed)
TSH has been normal Recheck today Patient had thyroid ultrasound in 06/2016 that recommended follow-up in 1 year We will order thyroid ultrasound today

## 2017-11-18 NOTE — Progress Notes (Signed)
Patient: Angelica Medina Female    DOB: 1960-09-05   57 y.o.   MRN: [id number] Visit Date: 11/18/2017  Today's Provider: Lavon Paganini, MD   I, Martha Clan, CMA, am acting as scribe for Lavon Paganini, MD.  Chief Complaint  Patient presents with  . Depression   Subjective:    Depression         Episode onset: FU from 09/11/2017. Pt was referred to psych, who decreased pt's Prozac to 10 mg and started pt on Buspar 5 mg BID. Pt was also started on Trazodone 25-50 mg po qhs prn for sleep.   Associated symptoms include insomnia (pt states she still has difficulty falling asleep while on Trazodone) and appetite change (increased).  Associated symptoms include no decreased concentration, no fatigue, no helplessness, no hopelessness, not irritable, no restlessness, no decreased interest, no headaches, not sad and no suicidal ideas.  Compliance with treatment is good.  Previous treatment provided significant relief.  Diabetes Pt states she is staking Metformin as prescribed. She states she is not currently dieting or exercising. Denies hypoglycemia. Associated sx include polyuria, neuropathy.  Lab Results  Component Value Date   HGBA1C 7.2 06/28/2017   Patient is concerned about "lumps" in various places.  She has noticed a lump in her left hand on the palmar aspect below her middle finger since she had surgery on her wrist.  She does not notice any catching or trigger finger.  It is not painful and seems to move with her finger.  She has had a lump on her left forearm that has been there for several years.  It is not growing in size.  It is not tender.  It is not fluctuant or draining anything.  She had a lump in her left axilla about a month ago.  She thought this was an abscess.  She did not come to have it evaluated because she was getting antibiotics for her wrist anyway and thought that they may treat any infection there as well.  It has drained yellow pus and gone down in size.   It is no longer painful.  She has noticed a small red area that remains there, however.     Allergies  Allergen Reactions  . Oxycodone Itching  . Wellbutrin [Bupropion] Anxiety     Current Outpatient Medications:  .  atorvastatin (LIPITOR) 20 MG tablet, Take 1 tablet (20 mg total) by mouth daily., Disp: 90 tablet, Rfl: 2 .  b complex vitamins capsule, Take 1 capsule by mouth daily., Disp: , Rfl:  .  busPIRone (BUSPAR) 10 MG tablet, Take 0.5 tablets (5 mg total) by mouth 2 (two) times daily., Disp: 30 tablet, Rfl: 1 .  FLUoxetine (PROZAC) 10 MG capsule, fluoxetine 20 mg tablet, Disp: , Rfl:  .  lisinopril (PRINIVIL,ZESTRIL) 5 MG tablet, Take 5 mg by mouth daily., Disp: , Rfl:  .  meloxicam (MOBIC) 15 MG tablet, Take 1 tablet (15 mg total) by mouth daily., Disp: 30 tablet, Rfl: 2 .  metFORMIN (GLUCOPHAGE) 500 MG tablet, TAKE ONE TABLET BY MOUTH TWICE A DAY WITH A MEAL, Disp: 60 tablet, Rfl: 3 .  Multiple Vitamin (MULTIVITAMIN) tablet, Take 1 tablet by mouth daily., Disp: , Rfl:  .  Omega-3 1000 MG CAPS, Take 1 tablet by mouth daily., Disp: , Rfl:  .  traZODone (DESYREL) 50 MG tablet, Take 0.5-1 tablets (25-50 mg total) by mouth at bedtime as needed for sleep., Disp: 30 tablet, Rfl: 1  Review  of Systems  Constitutional: Positive for appetite change (increased). Negative for fatigue.  Endocrine: Positive for polyphagia and polyuria. Negative for polydipsia.  Neurological: Negative for headaches.  Psychiatric/Behavioral: Positive for depression. Negative for decreased concentration and suicidal ideas. The patient has insomnia (pt states she still has difficulty falling asleep while on Trazodone).     Social History   Tobacco Use  . Smoking status: Former Smoker    Packs/day: 0.50    Years: 20.00    Pack years: 10.00    Last attempt to quit: 07/01/1994    Years since quitting: 23.4  . Smokeless tobacco: Never Used  Substance Use Topics  . Alcohol use: No   Objective:   BP 116/78  (BP Location: Left Arm, Patient Position: Sitting, Cuff Size: Large)   Pulse 78   Temp 97.9 F (36.6 C) (Oral)   Resp 16   Wt 234 lb (106.1 kg)   SpO2 95%   BMI 36.11 kg/m  Vitals:   11/18/17 0822  BP: 116/78  Pulse: 78  Resp: 16  Temp: 97.9 F (36.6 C)  TempSrc: Oral  SpO2: 95%  Weight: 234 lb (106.1 kg)     Physical Exam  Constitutional: She is oriented to person, place, and time. She appears well-developed and well-nourished. She is not irritable. No distress.  HENT:  Head: Normocephalic and atraumatic.  Mouth/Throat: Oropharynx is clear and moist.  Eyes: Conjunctivae are normal.  Neck: Neck supple. No thyromegaly present.  Cardiovascular: Normal rate, regular rhythm, normal heart sounds and intact distal pulses.  No murmur heard. Pulmonary/Chest: Effort normal and breath sounds normal. No respiratory distress. She has no wheezes. She has no rales.  Musculoskeletal: She exhibits no edema.  Range of motion intact of left fingers.  Palpable abnormality over tendon for middle finger  Lymphadenopathy:    She has no cervical adenopathy.  Neurological: She is alert and oriented to person, place, and time.  Skin: Skin is warm and dry. Capillary refill takes less than 2 seconds. No rash noted.  Pea-sized lipoma of left forearm on dorsal aspect that is mobile and nontender.  Scarring of left axilla without fluctuance or induration.  No erythema or signs of infection.  Psychiatric: She has a normal mood and affect. Her behavior is normal.  Vitals reviewed.      Assessment & Plan:   Problem List Items Addressed This Visit      Endocrine   T2DM (type 2 diabetes mellitus) ([place]) - Primary    Previously uncontrolled Continue metformin at current dose Recheck A1c Discussed diet and exercise Up-to-date on foot exam, eye exam, vaccines On ACE inhibitor      Relevant Orders   Hemoglobin A1c   Thyroid nodule    TSH has been normal Recheck today Patient had thyroid  ultrasound in 06/2016 that recommended follow-up in 1 year We will order thyroid ultrasound today      Relevant Orders   TSH   US THYROID     Other   Depression    Well-controlled on Prozac and BuSpar Continue seeing psychiatry Agree with psychology which patient is planning to start      Anxiety    Well-controlled Continue current meds Continue seeing psychiatry and encouraged psychology      Lipoma of left upper extremity    Small pea-sized lipoma of left upper extremity that is mobile and nontender Reassurance given Monitor and if becomes tender or is growing significantly, could consider surgery referral for possible removal  Also advised patient to discuss her hand with her orthopedic surgeon.  Could be ganglion cyst or early trigger finger changes.  May benefit from corticosteroid injection.  Reassurance given about scarring of left axilla.  No abscess or infection present at this time.  Appears to be healing well.  Return in about 6 months (around 05/21/2018) for chronic disease f/u.   The entirety of the information documented in the History of Present Illness, Review of Systems and Physical Exam were personally obtained by me. Portions of this information were initially documented by Raquel Sarna Ratchford, CMA and reviewed by me for thoroughness and accuracy.    Virginia Crews, MD, MPH [facility] 11/18/2017 10:28 AM

## 2017-11-18 NOTE — Assessment & Plan Note (Signed)
Previously uncontrolled Continue metformin at current dose Recheck A1c Discussed diet and exercise Up-to-date on foot exam, eye exam, vaccines On ACE inhibitor

## 2017-11-19 ENCOUNTER — Telehealth: Payer: Self-pay

## 2017-11-19 LAB — HEMOGLOBIN A1C
Est. average glucose Bld gHb Est-mCnc: 140 mg/dL
Hgb A1c MFr Bld: 6.5 % — ABNORMAL HIGH (ref 4.8–5.6)

## 2017-11-19 LAB — TSH: TSH: 1.7 u[IU]/mL (ref 0.450–4.500)

## 2017-11-19 NOTE — Telephone Encounter (Signed)
Viewed by Dawayne Patricia on 11/19/2017 9:31 AM

## 2017-11-19 NOTE — Telephone Encounter (Signed)
-----   Message from Virginia Crews, MD sent at 11/19/2017  9:01 AM EDT ----- A1c much improved to 6.5.  Thyroid function normal.  No changes to medications.  Virginia Crews, MD, MPH [facility] 11/19/2017 9:01 AM

## 2017-11-20 ENCOUNTER — Ambulatory Visit: Payer: 59 | Admitting: Psychiatry

## 2017-11-20 ENCOUNTER — Encounter: Payer: Self-pay | Admitting: Psychiatry

## 2017-11-20 ENCOUNTER — Other Ambulatory Visit: Payer: Self-pay

## 2017-11-20 VITALS — BP 131/82 | HR 80 | Temp 98.4°F | Wt 237.0 lb

## 2017-11-20 DIAGNOSIS — F330 Major depressive disorder, recurrent, mild: Secondary | ICD-10-CM | POA: Diagnosis not present

## 2017-11-20 DIAGNOSIS — G4700 Insomnia, unspecified: Secondary | ICD-10-CM

## 2017-11-20 DIAGNOSIS — F411 Generalized anxiety disorder: Secondary | ICD-10-CM | POA: Diagnosis not present

## 2017-11-20 MED ORDER — BUSPIRONE HCL 10 MG PO TABS: 5.0000 mg | ORAL_TABLET | Freq: Two times a day (BID) | ORAL | 1 refills | Status: DC

## 2017-11-20 MED ORDER — TRAZODONE HCL 50 MG PO TABS: 75.0000 mg | ORAL_TABLET | Freq: Every evening | ORAL | 1 refills | Status: DC | PRN

## 2017-11-20 MED ORDER — FLUOXETINE HCL 20 MG PO CAPS: 20.0000 mg | ORAL_CAPSULE | Freq: Every day | ORAL | 1 refills | Status: DC

## 2017-11-20 NOTE — Progress Notes (Signed)
[facility] MD OP Progress Note  11/20/2017 1:53 PM Leveta Wahab  MRN:  [id number]  Chief Complaint: ' I am here for follow up." Chief Complaint    Follow-up; Medication Refill     HPI: Monserrat is a 57 year old Caucasian female, unemployed, lives in [place], has a history of depression, presented to the clinic today for a follow-up visit.  She today reports she is tolerating the medications well.  She reports she has noticed improvement in her mood symptoms.  She reports she is motivated to do activities around her house.  She reports she feels like she is living life again.  She reports she started exercise activities again.  She also has been able to take care of her grandchild.  Patient reports she continues to have some restlessness at night.  She reports her sleep is interrupted.  She has to wake up in the middle of the night to use the restroom.  She reports she is diabetic.  She always had a weak bladder.  Discussed sleep hygiene techniques.  Also discussed increasing the trazodone to 75 mg.  She agrees with plan.  Patient denies any suicidality.  Patient denies any perceptual disturbances.  Patient reports she continues to have good social support from her family.  She looks forward to [facility] Day weekend when she and her husband are planning a trip to celebrate their wedding anniversary. Visit Diagnosis:    ICD-10-CM   1. GAD (generalized anxiety disorder) F41.1 busPIRone (BUSPAR) 10 MG tablet  2. MDD (major depressive disorder), recurrent episode, mild (HCC) F33.0 busPIRone (BUSPAR) 10 MG tablet    traZODone (DESYREL) 50 MG tablet  3. Insomnia, unspecified type G47.00 traZODone (DESYREL) 50 MG tablet    Past Psychiatric History: I have reviewed past psychiatric history from my progress note on 10/21/2017.  Past trials of Lexapro, Cymbalta, Paxil, Wellbutrin, Prozac.  Past Medical History:  Past Medical History:  Diagnosis Date  . Anxiety   . Depression   . Hypertension 2016  .  Sleep apnea   . T2DM (type 2 diabetes mellitus) ([place]) 2016   diet controlled    Past Surgical History:  Procedure Laterality Date  . [place]   for herniated disc, no hardware  . BREAST BIOPSY Left 2009   benign  . ECTOPIC PREGNANCY SURGERY    . ESOPHAGEAL DILATION  2014   has had several i nthe past for achalasia  . OPEN REDUCTION INTERNAL FIXATION (ORIF) DISTAL RADIAL FRACTURE Left 09/17/2017   Procedure: OPEN REDUCTION INTERNAL FIXATION (ORIF) DISTAL RADIAL FRACTURE;  Surgeon: Lovell Sheehan, MD;  Location: [facility] ORS;  Service: Orthopedics;  Laterality: Left;    Family Psychiatric History: Have reviewed  family psychiatric history from my progress note on 10/21/2017.  Family History:  Family History  Problem Relation Age of Onset  . Thyroid cancer Mother   . Anxiety disorder Mother   . Depression Mother   . Depression Father   . Diabetes Father   . COPD Father   . Anxiety disorder Father   . Hypothyroidism Sister   . Anxiety disorder Sister   . Healthy Brother   . Anxiety disorder Brother   . Anxiety disorder Brother   . Anxiety disorder Sister   . Depression Sister   . Drug abuse Paternal Aunt   . Alcohol abuse Maternal Grandfather   . Alcohol abuse Paternal Grandfather   . Alcohol abuse Paternal Grandmother   . Drug abuse Other     Social  History: Patient is married.  She is retired.  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  She relocated to [place] from [place] in May 2018.  Her husband is employed.  She has 3 children all adults.  2 of her children live in [place].  She reports she spends her time taking care of her grandchild who is 4 years old. Social History   Socioeconomic History  . Marital status: Married    Spouse name: [name]  . Number of children: 3  . Years of education: 5  . Highest education level: High school graduate  Occupational History  . 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 Financial resource strain: Not hard at all  . Food insecurity:    Worry: Never true    Inability: Never true  . Transportation needs:    Medical: No    Non-medical: No  Tobacco Use  . Smoking status: Former Smoker    Packs/day: 0.50    Years: 20.00    Pack years: 10.00    Last attempt to quit: 07/01/1994    Years since quitting: 23.4  . Smokeless tobacco: Never Used  Substance and Sexual Activity  . Alcohol use: No  . Drug use: No  . Sexual activity: Not Currently  Lifestyle  . Physical activity:    Days per week: 0 days    Minutes per session: 0 min  . Stress: Rather much  Relationships  . Social connections:    Talks on phone: Three times a week    Gets together: More than three times a week    Attends religious service: More than 4 times per year    Active member of club or organization: No    Attends meetings of clubs or organizations: Never    Relationship status: Married  Other Topics Concern  . Not on file  Social History Narrative  . Not on file    Allergies:  Allergies  Allergen Reactions  . Oxycodone Itching  . Wellbutrin [Bupropion] Anxiety    Metabolic Disorder Labs: Lab Results  Component Value Date   HGBA1C 6.5 (H) 11/18/2017   No results found for: PROLACTIN Lab Results  Component Value Date   CHOL 120 07/26/2017   TRIG 94 07/26/2017   HDL 43 07/26/2017   CHOLHDL 2.8 07/26/2017   LDLCALC 58 07/26/2017   LDLCALC 140 (H) 02/28/2017   Lab Results  Component Value Date   TSH 1.700 11/18/2017   TSH 1.420 02/28/2017    Therapeutic Level Labs: No results found for: LITHIUM No results found for: VALPROATE No components found for:  CBMZ  Current Medications: Current Outpatient Medications  Medication Sig Dispense Refill  . atorvastatin (LIPITOR) 20 MG tablet Take 1 tablet (20 mg total) by mouth daily. 90 tablet 2  . b complex vitamins capsule Take 1 capsule by mouth daily.    . busPIRone (BUSPAR) 10 MG tablet Take 0.5 tablets  (5 mg total) by mouth 2 (two) times daily. 30 tablet 1  . lisinopril (PRINIVIL,ZESTRIL) 5 MG tablet Take 5 mg by mouth daily.    . meloxicam (MOBIC) 15 MG tablet Take 1 tablet (15 mg total) by mouth daily. 30 tablet 2  . metFORMIN (GLUCOPHAGE) 500 MG tablet TAKE ONE TABLET BY MOUTH TWICE A DAY WITH A MEAL 60 tablet 3  . Multiple Vitamin (MULTIVITAMIN) tablet Take 1 tablet by mouth daily.    . Omega-3 1000 MG  CAPS Take 1 tablet by mouth daily.    . traZODone (DESYREL) 50 MG tablet Take 1.5 tablets (75 mg total) by mouth at bedtime as needed for sleep. 45 tablet 1  . FLUoxetine (PROZAC) 20 MG capsule Take 1 capsule (20 mg total) by mouth daily. 30 capsule 1   No current facility-administered medications for this visit.      Musculoskeletal: Strength & Muscle Tone: within normal limits Gait & Station: normal Patient leans: N/A  Psychiatric Specialty Exam: Review of Systems  Psychiatric/Behavioral: The patient is nervous/anxious and has insomnia.   All other systems reviewed and are negative.   Blood pressure 131/82, pulse 80, temperature 98.4 F (36.9 C), temperature source Oral, weight 237 lb (107.5 kg).Body mass index is 36.57 kg/m.  General Appearance: Casual  Eye Contact:  Fair  Speech:  Clear and Coherent  Volume:  Normal  Mood:  Anxious  Affect:  Congruent  Thought Process:  Goal Directed and Descriptions of Associations: Intact  Orientation:  Full (Time, Place, and Person)  Thought Content: Logical   Suicidal Thoughts:  No  Homicidal Thoughts:  No  Memory:  Immediate;   Fair Recent;   Fair Remote;   Fair  Judgement:  Fair  Insight:  Fair  Psychomotor Activity:  Normal  Concentration:  Concentration: Fair and Attention Span: Fair  Recall:  [employer] of Knowledge: Fair  Language: Fair  Akathisia:  No  Handed:  Right  AIMS (if indicated): na  Assets:  Communication Skills Desire for Improvement Housing Intimacy Social Support  ADL's:  Intact  Cognition: WNL   Sleep:  restless   Screenings: GAD-7     Office Visit from 09/11/2017 in [facility] Office Visit from 07/26/2017 in [facility] Visit from 06/28/2017 in [facility] Visit from 03/29/2017 in [facility] Visit from 02/28/2017 in [facility]  Total GAD-7 Score  7  11  10  8  6     PHQ2-9     Office Visit from 09/11/2017 in [facility] Visit from 07/26/2017 in [facility] Visit from 06/28/2017 in [facility] Visit from 03/29/2017 in [facility] Visit from 02/28/2017 in [facility]  PHQ-2 Total Score  6  6  0  1  2  PHQ-9 Total Score  17  17  10  5  4        Assessment and Plan: Jimmye is a 57 year old Caucasian female, retired, lives in [place], has a history of depression, OSA, diabetes mellitus, hyperlipidemia, hypertension, presented to the clinic today for a follow-up visit.  Patient reports improvement in her mood symptoms on the current medication regimen.  She continues to struggle with some sleep problems.  Discussed medication changes.  Patient continues to have good social support.  Plan MDD Continue Prozac 20 mg p.o. daily Continue BuSpar 5 mg p.o. twice daily  GAD Continue Prozac and BuSpar as scheduled.  For insomnia She has OSA, compliant on CPAP Will increase trazodone to 75 mg p.o. nightly as needed  Patient has been referred for psychotherapy with Ms. Peacock.  Follow-up in clinic in 6 weeks or sooner if needed.  More than 50 % of the time was spent for psychoeducation and supportive psychotherapy and care coordination.  This note was generated in part or whole with voice recognition software. Voice recognition is usually quite accurate but there are transcription errors that can and very often do  occur. I apologize for any typographical errors that were not detected and  corrected.       Ursula Alert, MD 11/20/2017, 1:53 PM

## 2017-11-21 ENCOUNTER — Ambulatory Visit
Admission: RE | Admit: 2017-11-21 | Discharge: 2017-11-21 | Disposition: A | Discharge status: Home / Self Care | Payer: 59 | Source: Ambulatory Visit | Attending: Family Medicine | Admitting: Family Medicine

## 2017-11-21 DIAGNOSIS — E041 Nontoxic single thyroid nodule: Secondary | ICD-10-CM | POA: Diagnosis present

## 2017-11-22 ENCOUNTER — Telehealth: Payer: Self-pay

## 2017-11-22 NOTE — Telephone Encounter (Signed)
-----   Message from Virginia Crews, MD sent at 11/22/2017  8:48 AM EDT ----- Small bilateral nodules and slightly enlarged thyroid gland.  They recommend annual [place] to follow one of the nodules until it has been stable for 5 years.  Virginia Crews, MD, MPH [facility] 11/22/2017 8:48 AM

## 2017-11-22 NOTE — Telephone Encounter (Signed)
Viewed by Dawayne Patricia on 11/22/2017 10:19 AM

## 2017-11-28 ENCOUNTER — Ambulatory Visit (INDEPENDENT_AMBULATORY_CARE_PROVIDER_SITE_OTHER): Payer: 59 | Admitting: Family Medicine

## 2017-11-28 ENCOUNTER — Encounter: Payer: Self-pay | Admitting: Family Medicine

## 2017-11-28 VITALS — BP 124/80 | HR 72 | Temp 98.4°F | Resp 16 | Wt 238.0 lb

## 2017-11-28 DIAGNOSIS — L02419 Cutaneous abscess of limb, unspecified: Secondary | ICD-10-CM

## 2017-11-28 MED ORDER — CHLORHEXIDINE GLUCONATE 4 % EX LIQD: Freq: Every day | CUTANEOUS | 1 refills | Status: DC | PRN

## 2017-11-28 MED ORDER — SULFAMETHOXAZOLE-TRIMETHOPRIM 800-160 MG PO TABS: 1.0000 | ORAL_TABLET | Freq: Two times a day (BID) | ORAL | 0 refills | Status: AC

## 2017-11-28 NOTE — Progress Notes (Signed)
Patient: Angelica Medina Female    DOB: 03-May-1961   57 y.o.   MRN: [id number] Visit Date: 11/28/2017  Today's Provider: Lavon Paganini, MD   I, Martha Clan, CMA, am acting as scribe for Lavon Paganini, MD.  Chief Complaint  Patient presents with  . Rash   Subjective:    Rash  This is a new problem. Episode onset: x 2 days. The affected locations include the left axilla. The rash is characterized by redness. Associated symptoms include congestion and coughing (in evenings). Pertinent negatives include no anorexia, diarrhea, fatigue, fever, shortness of breath or vomiting.   She is concerned about the growing lump in her left axilla that is grown rapidly over the last 2 days.  She denies any systemic symptoms.  She had another abscess in her left axilla within the last 1 to 2 months.  She was treated with antibiotics for an infection of her surgical wound on her left wrist which also seem to treat her abscess at that time.  This had completely resolved.  She wonders why she has had 2 of these in the last few months when she previously had none of these.    Allergies  Allergen Reactions  . Oxycodone Itching  . Wellbutrin [Bupropion] Anxiety     Current Outpatient Medications:  .  atorvastatin (LIPITOR) 20 MG tablet, Take 1 tablet (20 mg total) by mouth daily., Disp: 90 tablet, Rfl: 2 .  b complex vitamins capsule, Take 1 capsule by mouth daily., Disp: , Rfl:  .  busPIRone (BUSPAR) 10 MG tablet, Take 0.5 tablets (5 mg total) by mouth 2 (two) times daily., Disp: 30 tablet, Rfl: 1 .  FLUoxetine (PROZAC) 20 MG capsule, Take 1 capsule (20 mg total) by mouth daily., Disp: 30 capsule, Rfl: 1 .  lisinopril (PRINIVIL,ZESTRIL) 5 MG tablet, Take 5 mg by mouth daily., Disp: , Rfl:  .  meloxicam (MOBIC) 15 MG tablet, Take 1 tablet (15 mg total) by mouth daily., Disp: 30 tablet, Rfl: 2 .  metFORMIN (GLUCOPHAGE) 500 MG tablet, TAKE ONE TABLET BY MOUTH TWICE A DAY WITH A MEAL, Disp:  60 tablet, Rfl: 3 .  Multiple Vitamin (MULTIVITAMIN) tablet, Take 1 tablet by mouth daily., Disp: , Rfl:  .  Omega-3 1000 MG CAPS, Take 1 tablet by mouth daily., Disp: , Rfl:  .  traZODone (DESYREL) 50 MG tablet, Take 1.5 tablets (75 mg total) by mouth at bedtime as needed for sleep., Disp: 45 tablet, Rfl: 1  Review of Systems  Constitutional: Negative for fatigue and fever.  HENT: Positive for congestion.   Respiratory: Positive for cough (in evenings). Negative for shortness of breath.   Gastrointestinal: Negative for anorexia, diarrhea and vomiting.  Skin: Positive for rash.    Social History   Tobacco Use  . Smoking status: Former Smoker    Packs/day: 0.50    Years: 20.00    Pack years: 10.00    Last attempt to quit: 07/01/1994    Years since quitting: 23.4  . Smokeless tobacco: Never Used  Substance Use Topics  . Alcohol use: No   Objective:   BP 124/80 (BP Location: Left Arm, Patient Position: Sitting, Cuff Size: Large)   Pulse 72   Temp 98.4 F (36.9 C) (Oral)   Resp 16   Wt 238 lb (108 kg)   SpO2 96%   BMI 36.73 kg/m  Vitals:   11/28/17 0846  BP: 124/80  Pulse: 72  Resp: 16  Temp: 98.4 F (36.9 C)  TempSrc: Oral  SpO2: 96%  Weight: 238 lb (108 kg)     Physical Exam  Constitutional: She is oriented to person, place, and time. She appears well-developed and well-nourished. No distress.  HENT:  Head: Normocephalic and atraumatic.  Eyes: Conjunctivae are normal.  Neck: Neck supple.  Cardiovascular: Normal rate, regular rhythm, normal heart sounds and intact distal pulses.  No murmur heard. Pulmonary/Chest: Effort normal and breath sounds normal. No respiratory distress. She has no wheezes. She has no rales.  Musculoskeletal: She exhibits no edema or deformity.  Lymphadenopathy:    She has no cervical adenopathy.  Neurological: She is alert and oriented to person, place, and time.  Skin: Skin is warm and dry. Capillary refill takes less than 2 seconds.   6cm x 2cm area of induration and erythema in left axilla.  No fluctuance.  Central area with 2 punctations. No drainage.  Psychiatric: She has a normal mood and affect. Her behavior is normal.  Vitals reviewed.       Assessment & Plan:     1. Abscess of axilla -Abscess of axilla that is the second within the last 2 months - No area of fluctuance that is drainable at this time -Advised warm compresses to encourage drainage - Treat with Bactrim x7 days to cover for MRSA - Could be related to recent surgery or could have some component of hidradenitis considering she has had 2 so recently- advised loosefitting clothing, keeping the area clean and dry, and weight loss - Trial of Hibiclens after resolution of abscess to hopefully prevent further abscesses -Return precautions discussed    Meds ordered this encounter  Medications  . sulfamethoxazole-trimethoprim (BACTRIM DS,SEPTRA DS) 800-160 MG tablet    Sig: Take 1 tablet by mouth 2 (two) times daily for 7 days.    Dispense:  14 tablet    Refill:  0  . chlorhexidine (HIBICLENS) 4 % external liquid    Sig: Apply topically daily as needed.    Dispense:  120 mL    Refill:  1     Return if symptoms worsen or fail to improve.   The entirety of the information documented in the History of Present Illness, Review of Systems and Physical Exam were personally obtained by me. Portions of this information were initially documented by Raquel Sarna Ratchford, CMA and reviewed by me for thoroughness and accuracy.    Virginia Crews, MD, MPH [facility] 11/28/2017 9:35 AM

## 2017-11-28 NOTE — Patient Instructions (Signed)

## 2017-12-03 ENCOUNTER — Ambulatory Visit: Payer: 59 | Admitting: Licensed Clinical Social Worker

## 2018-01-01 ENCOUNTER — Ambulatory Visit: Payer: Self-pay | Admitting: Psychiatry

## 2018-01-01 ENCOUNTER — Encounter: Payer: Self-pay | Admitting: Psychiatry

## 2018-01-01 ENCOUNTER — Other Ambulatory Visit: Payer: Self-pay

## 2018-01-01 VITALS — BP 133/81 | HR 76 | Temp 98.0°F | Wt 233.6 lb

## 2018-01-01 DIAGNOSIS — G4700 Insomnia, unspecified: Secondary | ICD-10-CM

## 2018-01-01 DIAGNOSIS — F411 Generalized anxiety disorder: Secondary | ICD-10-CM

## 2018-01-01 DIAGNOSIS — F330 Major depressive disorder, recurrent, mild: Secondary | ICD-10-CM

## 2018-01-01 MED ORDER — TRAZODONE HCL 50 MG PO TABS
75.0000 mg | ORAL_TABLET | Freq: Every evening | ORAL | 0 refills | Status: DC | PRN
Start: 2018-01-01 — End: 2018-04-23

## 2018-01-01 MED ORDER — FLUOXETINE HCL 40 MG PO CAPS: 40.0000 mg | ORAL_CAPSULE | Freq: Every day | ORAL | 0 refills | Status: DC

## 2018-01-01 MED ORDER — BUSPIRONE HCL 10 MG PO TABS: 5.0000 mg | ORAL_TABLET | Freq: Two times a day (BID) | ORAL | 0 refills | Status: DC

## 2018-01-01 NOTE — Progress Notes (Signed)
[facility] MD OP Progress Note  01/01/2018 5:09 PM Angelica Medina  MRN:  [id number]  Chief Complaint: ' I am here for follow up." Chief Complaint    Follow-up; Medication Refill     HPI: Angelica Medina is a 57 year old Caucasian female, unemployed, lives in [place], has a history of depression, anxiety, presented to the clinic today for a follow-up visit.  Patient today reports she has been having some worsening depression and anxiety symptoms.  She describes her symptoms as feeling sad, feeling less motivated, anhedonia, lack of energy, feeling nervous and so on.  She reports the symptoms have been worsening since the past few weeks.  Patient also reports restlessness at night at least 2 nights a week.  She continues to take trazodone but she reports it does not help much during those nights when she has restless sleep.  She continues to use CPAP for her obstructive sleep apnea.  Patient reports she has been compliant with her Prozac and BuSpar as prescribed.  Patient denies any side effects.  Patient denies any suicidality or homicidality.  Patient denies any perceptual disturbances.  Discussed increasing her Prozac.  She reports that when she was taking 40 mg she did well but when she went up to 60 mg it made her feel like a zombie.  Patient agrees to go back to 40 mg to see if that will help her better.  Patient was referred for psychotherapy in the past however she reports she has health insurance issues at this time and is waiting for her husband's new job to start so that she can start her therapy sessions.  Patient reports she looks forward to July 4 holiday. Visit Diagnosis:    ICD-10-CM   1. GAD (generalized anxiety disorder) F41.1 busPIRone (BUSPAR) 10 MG tablet    FLUoxetine (PROZAC) 40 MG capsule  2. MDD (major depressive disorder), recurrent episode, mild (HCC) F33.0 traZODone (DESYREL) 50 MG tablet    busPIRone (BUSPAR) 10 MG tablet    FLUoxetine (PROZAC) 40 MG capsule  3. Insomnia,  unspecified type G47.00 traZODone (DESYREL) 50 MG tablet    Past Psychiatric History: Reviewed past psychiatric history from my progress note on 10/21/2017.  Past trials of Lexapro, Cymbalta, Paxil, Wellbutrin, Prozac.  Past Medical History:  Past Medical History:  Diagnosis Date  . Anxiety   . Depression   . Hypertension 2016  . Sleep apnea   . T2DM (type 2 diabetes mellitus) ([place]) 2016   diet controlled    Past Surgical History:  Procedure Laterality Date  . [place]   for herniated disc, no hardware  . BREAST BIOPSY Left 2009   benign  . ECTOPIC PREGNANCY SURGERY    . ESOPHAGEAL DILATION  2014   has had several i nthe past for achalasia  . OPEN REDUCTION INTERNAL FIXATION (ORIF) DISTAL RADIAL FRACTURE Left 09/17/2017   Procedure: OPEN REDUCTION INTERNAL FIXATION (ORIF) DISTAL RADIAL FRACTURE;  Surgeon: Lovell Sheehan, MD;  Location: [facility] ORS;  Service: Orthopedics;  Laterality: Left;    Family Psychiatric History: Reviewed family psychiatric history from my progress note on 10/21/2017.  Family History:  Family History  Problem Relation Age of Onset  . Thyroid cancer Mother   . Anxiety disorder Mother   . Depression Mother   . Depression Father   . Diabetes Father   . COPD Father   . Anxiety disorder Father   . Hypothyroidism Sister   . Anxiety disorder Sister   . Healthy Brother   .  Anxiety disorder Brother   . Anxiety disorder Brother   . Anxiety disorder Sister   . Depression Sister   . Drug abuse Paternal Aunt   . Alcohol abuse Maternal Grandfather   . Alcohol abuse Paternal Grandfather   . Alcohol abuse Paternal Grandmother   . Drug abuse Other     Social History: Reviewed social history from my progress note on 10/21/2017. Social History   Socioeconomic History  . Marital status: Married    Spouse name: [name]  . Number of children: 3  . Years of education: 57  . Highest education level: High school graduate  Occupational History  .  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 Financial resource strain: Not hard at all  . Food insecurity:    Worry: Never true    Inability: Never true  . Transportation needs:    Medical: No    Non-medical: No  Tobacco Use  . Smoking status: Former Smoker    Packs/day: 0.50    Years: 20.00    Pack years: 10.00    Last attempt to quit: 07/01/1994    Years since quitting: 23.5  . Smokeless tobacco: Never Used  Substance and Sexual Activity  . Alcohol use: No  . Drug use: No  . Sexual activity: Not Currently  Lifestyle  . Physical activity:    Days per week: 0 days    Minutes per session: 0 min  . Stress: Rather much  Relationships  . Social connections:    Talks on phone: Three times a week    Gets together: More than three times a week    Attends religious service: More than 4 times per year    Active member of club or organization: No    Attends meetings of clubs or organizations: Never    Relationship status: Married  Other Topics Concern  . Not on file  Social History Narrative  . Not on file    Allergies:  Allergies  Allergen Reactions  . Oxycodone Itching  . Wellbutrin [Bupropion] Anxiety    Metabolic Disorder Labs: Lab Results  Component Value Date   HGBA1C 6.5 (H) 11/18/2017   No results found for: PROLACTIN Lab Results  Component Value Date   CHOL 120 07/26/2017   TRIG 94 07/26/2017   HDL 43 07/26/2017   CHOLHDL 2.8 07/26/2017   LDLCALC 58 07/26/2017   LDLCALC 140 (H) 02/28/2017   Lab Results  Component Value Date   TSH 1.700 11/18/2017   TSH 1.420 02/28/2017    Therapeutic Level Labs: No results found for: LITHIUM No results found for: VALPROATE No components found for:  CBMZ  Current Medications: Current Outpatient Medications  Medication Sig Dispense Refill  . atorvastatin (LIPITOR) 20 MG tablet Take 1 tablet (20 mg total) by mouth daily. 90 tablet 2  . b complex vitamins capsule Take 1 capsule by mouth daily.    .  busPIRone (BUSPAR) 10 MG tablet Take 0.5 tablets (5 mg total) by mouth 2 (two) times daily. 90 tablet 0  . chlorhexidine (HIBICLENS) 4 % external liquid Apply topically daily as needed. 120 mL 1  . lisinopril (PRINIVIL,ZESTRIL) 5 MG tablet Take 5 mg by mouth daily.    . meloxicam (MOBIC) 15 MG tablet Take 1 tablet (15 mg total) by mouth daily. 30 tablet 2  . metFORMIN (GLUCOPHAGE) 500 MG tablet TAKE ONE TABLET BY MOUTH TWICE A DAY WITH A MEAL 60 tablet 3  . Multiple Vitamin (MULTIVITAMIN) tablet  Take 1 tablet by mouth daily.    . Omega-3 1000 MG CAPS Take 1 tablet by mouth daily.    . traZODone (DESYREL) 50 MG tablet Take 1.5-2 tablets (75-100 mg total) by mouth at bedtime as needed for sleep. 180 tablet 0  . FLUoxetine (PROZAC) 40 MG capsule Take 1 capsule (40 mg total) by mouth daily. 90 capsule 0   No current facility-administered medications for this visit.      Musculoskeletal: Strength & Muscle Tone: within normal limits Gait & Station: normal Patient leans: N/A  Psychiatric Specialty Exam: Review of Systems  Psychiatric/Behavioral: Positive for depression. The patient is nervous/anxious and has insomnia.   All other systems reviewed and are negative.   Blood pressure 133/81, pulse 76, temperature 98 F (36.7 C), temperature source Oral, weight 233 lb 9.6 oz (106 kg).Body mass index is 36.05 kg/m.  General Appearance: Casual  Eye Contact:  Fair  Speech:  Normal Rate  Volume:  Normal  Mood:  Anxious and Dysphoric  Affect:  Congruent  Thought Process:  Goal Directed and Descriptions of Associations: Intact  Orientation:  Full (Time, Place, and Person)  Thought Content: Logical   Suicidal Thoughts:  No  Homicidal Thoughts:  No  Memory:  Immediate;   Fair Recent;   Fair Remote;   Fair  Judgement:  Fair  Insight:  Fair  Psychomotor Activity:  Normal  Concentration:  Concentration: Fair and Attention Span: Fair  Recall:  [employer] of Knowledge: Fair  Language: Fair   Akathisia:  No  Handed:  Right  AIMS (if indicated): na  Assets:  Communication Skills Desire for Improvement Social Support  ADL's:  Intact  Cognition: WNL  Sleep:  restless   Screenings: GAD-7     [facility] Visit from 09/11/2017 in [facility] Office Visit from 07/26/2017 in [place] Visit from 06/28/2017 in [facility] Visit from 03/29/2017 in [facility] Visit from 02/28/2017 in [facility]  Total GAD-7 Score  7  11  10  8  6     PHQ2-9     Office Visit from 09/11/2017 in [facility] Visit from 07/26/2017 in [facility] Visit from 06/28/2017 in [place] Visit from 03/29/2017 in [place] Visit from 02/28/2017 in [place]  PHQ-2 Total Score  6  6  0  1  2  PHQ-9 Total Score  17  17  10  5  4        Assessment and Plan: Gala is a 57 year old Caucasian female, retired, lives in [place], has a history of depression, OSA, diabetes mellitus, hyperlipidemia, hypertension, presented to the clinic today for a follow-up visit.  Patient reports some worsening depressive symptoms and hence discussed readjusting her medications.  Discussed plan as noted below.  She continues to have good social support.  Plan MDD Increase Prozac to 40 mg p.o. daily.  Patient reports she tried 60 mg in the past and that gave her side effects.  She prefers to be on 40 mg. Continue BuSpar 5 mg p.o. twice daily.  GAD Continue Prozac and BuSpar as prescribed  For insomnia She has OSA, compliant on CPAP. Discussed with patient to increase trazodone to 100 mg p.o. nightly as needed  Patient has been referred for psychotherapy with Ms. [name] how she has some financial issues at this time and reports she will reach out to the therapist once she is  ready.  Follow up in clinic in 1 month.  More than 50 % of  the time was spent for psychoeducation and supportive psychotherapy and care coordination.  This note was generated in part or whole with voice recognition software. Voice recognition is usually quite accurate but there are transcription errors that can and very often do occur. I apologize for any typographical errors that were not detected and corrected.       Ursula Alert, MD 01/01/2018, 5:09 PM

## 2018-01-27 ENCOUNTER — Other Ambulatory Visit: Payer: Self-pay | Admitting: Family Medicine

## 2018-01-27 DIAGNOSIS — E7800 Pure hypercholesterolemia, unspecified: Secondary | ICD-10-CM

## 2018-02-05 ENCOUNTER — Other Ambulatory Visit: Payer: Self-pay | Admitting: Family Medicine

## 2018-02-12 ENCOUNTER — Ambulatory Visit: Payer: Self-pay | Admitting: Psychiatry

## 2018-02-19 ENCOUNTER — Other Ambulatory Visit: Payer: Self-pay

## 2018-02-19 ENCOUNTER — Ambulatory Visit (INDEPENDENT_AMBULATORY_CARE_PROVIDER_SITE_OTHER): Payer: BLUE CROSS/BLUE SHIELD | Admitting: Psychiatry

## 2018-02-19 ENCOUNTER — Encounter: Payer: Self-pay | Admitting: Psychiatry

## 2018-02-19 VITALS — BP 127/78 | HR 82 | Temp 98.8°F | Wt 233.6 lb

## 2018-02-19 DIAGNOSIS — F330 Major depressive disorder, recurrent, mild: Secondary | ICD-10-CM

## 2018-02-19 DIAGNOSIS — F411 Generalized anxiety disorder: Secondary | ICD-10-CM

## 2018-02-19 DIAGNOSIS — G4700 Insomnia, unspecified: Secondary | ICD-10-CM | POA: Diagnosis not present

## 2018-02-19 MED ORDER — FLUOXETINE HCL 40 MG PO CAPS: 40.0000 mg | ORAL_CAPSULE | Freq: Every day | ORAL | 0 refills | Status: DC

## 2018-02-19 MED ORDER — BUSPIRONE HCL 10 MG PO TABS: 10.0000 mg | ORAL_TABLET | Freq: Three times a day (TID) | ORAL | 0 refills | Status: DC

## 2018-02-19 NOTE — Progress Notes (Signed)
[place] MD OP Progress Note  02/19/2018 10:38 AM Angelica Medina  MRN:  [id number]  Chief Complaint: ' I am here for follow up." Chief Complaint    Follow-up; Medication Refill     HPI: Angelica Medina is a 57 years old Caucasian female unemployed, lives in [place], has a history of depression, anxiety, presented to the clinic today for a follow-up visit.  Patient today reports she continues to feel nervous and on edge all the time.  She denies any significant depressive symptoms like sadness or crying spells today.  She reports the Prozac 40 mg has not taken her nervousness away.  She also reports some problems with her attention.  She reports her thoughts are always wandering and she always feels like she is thinking about other things when she is supposed to focus on what is going on in her present moment.  She wonders whether she has ADD.  Patient however denies having any problems with inattention or focus or hyperactivity growing up.  Patient denies having any complaints or failing grades while at school.   She reports she will start psychotherapy soon since she was able to get her new health insurance plan.  She reports she will make an appointment with Elmyra Ricks, therapist here.  Patient reports increasing the trazodone dosage has helped her with her sleep.  Patient denies any suicidality.  She denies any homicidality.  Patient continues to be compliant with her CPAP.   Visit Diagnosis:    ICD-10-CM   1. GAD (generalized anxiety disorder) F41.1 busPIRone (BUSPAR) 10 MG tablet    FLUoxetine (PROZAC) 40 MG capsule  2. MDD (major depressive disorder), recurrent episode, mild (HCC) F33.0 busPIRone (BUSPAR) 10 MG tablet    FLUoxetine (PROZAC) 40 MG capsule  3. Insomnia, unspecified type G47.00     Past Psychiatric History: Reviewed past psychiatric history from my progress note on 10/21/2017.  Past trials of Lexapro, Cymbalta, Paxil, Wellbutrin, Prozac.  Past Medical History:  Past Medical History:   Diagnosis Date  . Anxiety   . Depression   . Hypertension 2016  . Sleep apnea   . T2DM (type 2 diabetes mellitus) ([place]) 2016   diet controlled    Past Surgical History:  Procedure Laterality Date  . [place]   for herniated disc, no hardware  . BREAST BIOPSY Left 2009   benign  . ECTOPIC PREGNANCY SURGERY    . ESOPHAGEAL DILATION  2014   has had several i nthe past for achalasia  . OPEN REDUCTION INTERNAL FIXATION (ORIF) DISTAL RADIAL FRACTURE Left 09/17/2017   Procedure: OPEN REDUCTION INTERNAL FIXATION (ORIF) DISTAL RADIAL FRACTURE;  Surgeon: Lovell Sheehan, MD;  Location: [facility] ORS;  Service: Orthopedics;  Laterality: Left;    Family Psychiatric History: Have reviewed family psychiatric history from my progress note on 10/21/2017  Family History:  Family History  Problem Relation Age of Onset  . Thyroid cancer Mother   . Anxiety disorder Mother   . Depression Mother   . Depression Father   . Diabetes Father   . COPD Father   . Anxiety disorder Father   . Hypothyroidism Sister   . Anxiety disorder Sister   . Healthy Brother   . Anxiety disorder Brother   . Anxiety disorder Brother   . Anxiety disorder Sister   . Depression Sister   . Drug abuse Paternal Aunt   . Alcohol abuse Maternal Grandfather   . Alcohol abuse Paternal Grandfather   . Alcohol abuse Paternal  Grandmother   . Drug abuse Other     Social History: I have reviewed social history from my progress note on 10/21/2017. Social History   Socioeconomic History  . Marital status: Married    Spouse name: [name]  . Number of children: 3  . Years of education: 37  . Highest education level: High school graduate  Occupational History  . 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 Financial resource strain: Not hard at all  . Food insecurity:    Worry: Never true    Inability: Never true  . Transportation needs:    Medical: No    Non-medical: No  Tobacco Use  . Smoking status:  Former Smoker    Packs/day: 0.50    Years: 20.00    Pack years: 10.00    Last attempt to quit: 07/01/1994    Years since quitting: 23.6  . Smokeless tobacco: Never Used  Substance and Sexual Activity  . Alcohol use: No  . Drug use: No  . Sexual activity: Not Currently  Lifestyle  . Physical activity:    Days per week: 0 days    Minutes per session: 0 min  . Stress: Rather much  Relationships  . Social connections:    Talks on phone: Three times a week    Gets together: More than three times a week    Attends religious service: More than 4 times per year    Active member of club or organization: No    Attends meetings of clubs or organizations: Never    Relationship status: Married  Other Topics Concern  . Not on file  Social History Narrative  . Not on file    Allergies:  Allergies  Allergen Reactions  . Oxycodone Itching  . Wellbutrin [Bupropion] Anxiety    Metabolic Disorder Labs: Lab Results  Component Value Date   HGBA1C 6.5 (H) 11/18/2017   No results found for: PROLACTIN Lab Results  Component Value Date   CHOL 120 07/26/2017   TRIG 94 07/26/2017   HDL 43 07/26/2017   CHOLHDL 2.8 07/26/2017   LDLCALC 58 07/26/2017   LDLCALC 140 (H) 02/28/2017   Lab Results  Component Value Date   TSH 1.700 11/18/2017   TSH 1.420 02/28/2017    Therapeutic Level Labs: No results found for: LITHIUM No results found for: VALPROATE No components found for:  CBMZ  Current Medications: Current Outpatient Medications  Medication Sig Dispense Refill  . atorvastatin (LIPITOR) 20 MG tablet TAKE ONE TABLET BY MOUTH DAILY 30 tablet 5  . b complex vitamins capsule Take 1 capsule by mouth daily.    . busPIRone (BUSPAR) 10 MG tablet Take 1 tablet (10 mg total) by mouth 3 (three) times daily. 270 tablet 0  . chlorhexidine (HIBICLENS) 4 % external liquid Apply topically daily as needed. 120 mL 1  . FLUoxetine (PROZAC) 40 MG capsule Take 1 capsule (40 mg total) by mouth daily.  90 capsule 0  . lisinopril (PRINIVIL,ZESTRIL) 5 MG tablet Take 5 mg by mouth daily.    . meloxicam (MOBIC) 15 MG tablet Take 1 tablet (15 mg total) by mouth daily. 30 tablet 2  . metFORMIN (GLUCOPHAGE) 500 MG tablet TAKE ONE TABLET BY MOUTH TWICE A DAY WITH A MEAL 60 tablet 2  . Multiple Vitamin (MULTIVITAMIN) tablet Take 1 tablet by mouth daily.    . Omega-3 1000 MG CAPS Take 1 tablet by mouth daily.    . traZODone (DESYREL) 50 MG tablet  Take 1.5-2 tablets (75-100 mg total) by mouth at bedtime as needed for sleep. 180 tablet 0   No current facility-administered medications for this visit.      Musculoskeletal: Strength & Muscle Tone: within normal limits Gait & Station: normal Patient leans: N/A  Psychiatric Specialty Exam: Review of Systems  Psychiatric/Behavioral: Positive for depression. The patient is nervous/anxious.   All other systems reviewed and are negative.   Blood pressure 127/78, pulse 82, temperature 98.8 F (37.1 C), temperature source Oral, weight 233 lb 9.6 oz (106 kg).Body mass index is 36.05 kg/m.  General Appearance: Casual  Eye Contact:  Fair  Speech:  Clear and Coherent  Volume:  Normal  Mood:  Anxious  Affect:  Congruent  Thought Process:  Goal Directed and Descriptions of Associations: Intact  Orientation:  Full (Time, Place, and Person)  Thought Content: Logical   Suicidal Thoughts:  No  Homicidal Thoughts:  No  Memory:  Immediate;   Fair Recent;   Fair Remote;   Fair  Judgement:  Fair  Insight:  Fair  Psychomotor Activity:  Normal  Concentration:  Concentration: Fair and Attention Span: Fair  Recall:  [employer] of Knowledge: Fair  Language: Fair  Akathisia:  No  Handed:  Right  AIMS (if indicated): na  Assets:  Communication Skills Desire for Improvement Housing Social Support  ADL's:  Intact  Cognition: WNL  Sleep:  improving   Screenings: GAD-7     Office Visit from 09/11/2017 in [facility] Visit from  07/26/2017 in [facility] Visit from 06/28/2017 in [facility] Visit from 03/29/2017 in [facility] Visit from 02/28/2017 in [facility]  Total GAD-7 Score  7  11  10  8  6     PHQ2-9     Office Visit from 09/11/2017 in [facility] Visit from 07/26/2017 in [facility] [facility] Visit from 06/28/2017 in [facility] [place] Visit from 03/29/2017 in [facility] Visit from 02/28/2017 in [facility]  PHQ-2 Total Score  6  6  0  1  2  PHQ-9 Total Score  17  17  10  5  4        Assessment and Plan: Falon is a 57 year old Caucasian female, retired, lives in [place], has a history of depression, OSA, diabetes mellitus, hyperlipidemia, hypertension, presented to the clinic today for a follow-up visit.  Patient continues to struggle with anxiety symptoms and problems with focus.  Discussed medication changes as noted below.  She will also start psychotherapy soon.  Plan MDD Continue Prozac 40 mg p.o. daily. Patient wants to stay on 40 mg since she tried 60 mg in the past and it gave her side effects. Increase BuSpar to 10 mg p.o. 3 times daily. PHQ 9 equals 8  GAD Prozac as prescribed Increase BuSpar to 10 mg p.o. 3 times daily. GAD 7 equals 10  For insomnia Continue trazodone 100 mg p.o. nightly as needed OSA compliant with CPAP.  Patient will start psychotherapy with Ms. Peacock.  Also discussed mindfulness based relaxation techniques, provided her information and resources.  Follow-up in clinic in 1 month or sooner if needed.  More than 50 % of the time was spent for psychoeducation and supportive psychotherapy and care coordination. This note was generated in part or whole with voice recognition software. Voice recognition is usually quite accurate but there are transcription errors that can and very often do  occur. I apologize  for any typographical errors that were not detected and corrected.       Ursula Alert, MD 02/19/2018, 10:38 AM

## 2018-02-19 NOTE — Patient Instructions (Signed)
Mindfulness-Based Stress Reduction  Mindfulness-based stress reduction (MBSR) is a program that helps people learn to practice mindfulness. Mindfulness is the practice of intentionally paying attention to the present moment. It can be learned and practiced through techniques such as education, breathing exercises, meditation, and yoga. MBSR includes several mindfulness techniques in one program.  MBSR works best when you understand the treatment, are willing to try new things, and can commit to spending time practicing what you learn. MBSR training may include learning about:  · How your emotions, thoughts, and reactions affect your body.  · New ways to respond to things that cause negative thoughts to start (triggers).  · How to notice your thoughts and let go of them.  · Practicing awareness of everyday things that you normally do without thinking.  · The techniques and goals of different types of meditation.    What are the benefits of MBSR?  MBSR can have many benefits, which include helping you to:  · Develop self-awareness. This refers to knowing and understanding yourself.  · Learn skills and attitudes that help you to participate in your own health care.  · Learn new ways to care for yourself.  · Be more accepting about how things are, and let things go.  · Be less judgmental and approach things with an open mind.  · Be patient with yourself and trust yourself more.    MBSR has also been shown to:  · Reduce negative emotions, such as depression and anxiety.  · Improve memory and focus.  · Change how you sense and approach pain.  · Boost your body's ability to fight infections.  · Help you connect better with other people.  · Improve your sense of well-being.    Follow these instructions at home:  · Find a local in-person or online MBSR program.  · Set aside some time regularly for mindfulness practice.  · Find a mindfulness practice that works best for you. This may include one or more of the  following:  ? Meditation. Meditation involves focusing your mind on a certain thought or activity.  ? Breathing awareness exercises. These help you to stay present by focusing on your breath.  ? Body scan. For this practice, you lie down and pay attention to each part of your body from head to toe. You can identify tension and soreness and intentionally relax parts of your body.  ? Yoga. Yoga involves stretching and breathing, and it can improve your ability to move and be flexible. It can also provide an experience of testing your body's limits, which can help you release stress.  ? Mindful eating. This way of eating involves focusing on the taste, texture, color, and smell of each bite of food. Because this slows down eating and helps you feel full sooner, it can be an important part of a weight-loss plan.  · Find a podcast or recording that provides guidance for breathing awareness, body scan, or meditation exercises. You can listen to these any time when you have a free moment to rest without distractions.  · Follow your treatment plan as told by your health care provider. This may include taking regular medicines and making changes to your diet or lifestyle as recommended.  How to practice mindfulness  To do a basic awareness exercise:  · Find a comfortable place to sit.  · Pay attention to the present moment. Observe your thoughts, feelings, and surroundings just as they are.  · Avoid placing judgment on yourself,   your feelings, or your surroundings. Make note of any judgment that comes up, and let it go.  · Your mind may wander, and that is okay. Make note of when your thoughts drift, and return your attention to the present moment.    To do basic mindfulness meditation:  · Find a comfortable place to sit. This may include a stable chair or a firm floor cushion.  ? Sit upright with your back straight. Let your arms fall next to your side with your hands resting on your legs.  ? If sitting in a chair, rest  your feet flat on the floor.  ? If sitting on a cushion, cross your legs in front of you.  · Keep your head in a neutral position with your chin dropped slightly. Relax your jaw and rest the tip of your tongue on the roof of your mouth. Drop your gaze to the floor. You can close your eyes if you like.  · Breathe normally and pay attention to your breath. Feel the air moving in and out of your nose. Feel your belly expanding and relaxing with each breath.  · Your mind may wander, and that is okay. Make note of when your thoughts drift, and return your attention to your breath.  · Avoid placing judgment on yourself, your feelings, or your surroundings. Make note of any judgment or feelings that come up, let them go, and bring your attention back to your breath.  · When you are ready, lift your gaze or open your eyes. Pay attention to how your body feels after the meditation.    Where to find more information:  You can find more information about MBSR from:  · Your health care provider.  · Community-based meditation centers or programs.  · Programs offered near you.    Summary  · Mindfulness-based stress reduction (MBSR) is a program that teaches you how to intentionally pay attention to the present moment. It is used with other treatments to help you cope better with daily stress, emotions, and pain.  · MBSR focuses on developing self-awareness, which allows you to respond to life stress without judgment or negative emotions.  · MBSR programs may involve learning different mindfulness practices, such as breathing exercises, meditation, yoga, body scan, or mindful eating. Find a mindfulness practice that works best for you, and set aside time for it on a regular basis.  This information is not intended to replace advice given to you by your health care provider. Make sure you discuss any questions you have with your health care provider.  Document Released: 10/25/2016 Document Revised: 10/25/2016 Document Reviewed:  10/25/2016  Elsevier Interactive Patient Education © 2018 Elsevier Inc.

## 2018-03-13 ENCOUNTER — Ambulatory Visit (INDEPENDENT_AMBULATORY_CARE_PROVIDER_SITE_OTHER): Payer: BLUE CROSS/BLUE SHIELD | Admitting: Licensed Clinical Social Worker

## 2018-03-13 DIAGNOSIS — F330 Major depressive disorder, recurrent, mild: Secondary | ICD-10-CM | POA: Diagnosis not present

## 2018-03-13 DIAGNOSIS — F411 Generalized anxiety disorder: Secondary | ICD-10-CM | POA: Diagnosis not present
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
## 2018-03-24 ENCOUNTER — Ambulatory Visit (INDEPENDENT_AMBULATORY_CARE_PROVIDER_SITE_OTHER): Payer: BLUE CROSS/BLUE SHIELD | Admitting: Psychiatry

## 2018-03-24 ENCOUNTER — Other Ambulatory Visit: Payer: Self-pay

## 2018-03-24 ENCOUNTER — Encounter: Payer: Self-pay | Admitting: Psychiatry

## 2018-03-24 VITALS — BP 124/80 | HR 77 | Temp 98.2°F | Wt 231.8 lb

## 2018-03-24 DIAGNOSIS — G4700 Insomnia, unspecified: Secondary | ICD-10-CM

## 2018-03-24 DIAGNOSIS — F330 Major depressive disorder, recurrent, mild: Secondary | ICD-10-CM

## 2018-03-24 DIAGNOSIS — F411 Generalized anxiety disorder: Secondary | ICD-10-CM

## 2018-03-24 MED ORDER — BREXPIPRAZOLE 0.5 MG PO TABS: 0.5000 mg | ORAL_TABLET | Freq: Every day | ORAL | 0 refills | Status: DC

## 2018-03-24 NOTE — Progress Notes (Signed)
[facility] OP Progress Note  03/24/2018 1:07 PM Angelica Medina  MRN:  [id number]  Chief Complaint: ' I am here for follow up visit.' Chief Complaint    Follow-up; Medication Refill     HPI: Angelica Medina is a 57 year old Caucasian female, unemployed, lives in [place], has a history of depression, anxiety, presented to clinic today for a follow-up visit.  Patient today reports she continues to feel depressed.  BuSpar may be helping to some extent with her anxiety symptoms, the dosage was increased last visit.  She however reports she continues to feel depressed and sad.  She reports that at times she feels like she is withdrawn and does not have the desire to talk to people anymore .  She also reports possible side effects to the BuSpar dosage increase.  She reports she has this headache and also feels lightheaded.  She however also has a cold and does not know where the symptoms are coming from.  Patient has started psychotherapy sessions with Elmyra Ricks and has upcoming appointment this week.  Patient reports sleep is fair.  Patient denies any suicidality or perceptual disturbances.  Discussed adding rexulti, patient agreed with plan.  Provided medication education.   Visit Diagnosis:    ICD-10-CM   1. GAD (generalized anxiety disorder) F41.1   2. MDD (major depressive disorder), recurrent episode, mild ([place]) F33.0   3. Insomnia, unspecified type G47.00     Past Psychiatric History: Have reviewed past psychiatric history from my progress note on 10/21/2017.  Past trials of Lexapro, Cymbalta, Paxil, Wellbutrin, Prozac  Past Medical History:  Past Medical History:  Diagnosis Date  . Anxiety   . Depression   . Hypertension 2016  . Sleep apnea   . T2DM (type 2 diabetes mellitus) ([place]) 2016   diet controlled    Past Surgical History:  Procedure Laterality Date  . [place]   for herniated disc, no hardware  . BREAST BIOPSY Left 2009   benign  . ECTOPIC PREGNANCY SURGERY    .  ESOPHAGEAL DILATION  2014   has had several i nthe past for achalasia  . OPEN REDUCTION INTERNAL FIXATION (ORIF) DISTAL RADIAL FRACTURE Left 09/17/2017   Procedure: OPEN REDUCTION INTERNAL FIXATION (ORIF) DISTAL RADIAL FRACTURE;  Surgeon: Lovell Sheehan, MD;  Location: [facility] ORS;  Service: Orthopedics;  Laterality: Left;    Family Psychiatric History: Reviewed family psychiatric history from my progress note on 10/21/2017  Family History:  Family History  Problem Relation Age of Onset  . Thyroid cancer Mother   . Anxiety disorder Mother   . Depression Mother   . Depression Father   . Diabetes Father   . COPD Father   . Anxiety disorder Father   . Hypothyroidism Sister   . Anxiety disorder Sister   . Healthy Brother   . Anxiety disorder Brother   . Anxiety disorder Brother   . Anxiety disorder Sister   . Depression Sister   . Drug abuse Paternal Aunt   . Alcohol abuse Maternal Grandfather   . Alcohol abuse Paternal Grandfather   . Alcohol abuse Paternal Grandmother   . Drug abuse Other     Social History: Reviewed social history from my progress note on 10/21/2017. Social History   Socioeconomic History  . Marital status: Married    Spouse name: [name]  . Number of children: 3  . Years of education: 25  . Highest education level: High school graduate  Occupational History  . 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 Financial resource strain: Not hard at all  . Food insecurity:    Worry: Never true    Inability: Never true  . Transportation needs:    Medical: No    Non-medical: No  Tobacco Use  . Smoking status: Former Smoker    Packs/day: 0.50    Years: 20.00    Pack years: 10.00    Last attempt to quit: 07/01/1994    Years since quitting: 23.7  . Smokeless tobacco: Never Used  Substance and Sexual Activity  . Alcohol use: No  . Drug use: No  . Sexual activity: Not Currently  Lifestyle  . Physical activity:    Days per week: 0 days    Minutes  per session: 0 min  . Stress: Rather much  Relationships  . Social connections:    Talks on phone: Three times a week    Gets together: More than three times a week    Attends religious service: More than 4 times per year    Active member of club or organization: No    Attends meetings of clubs or organizations: Never    Relationship status: Married  Other Topics Concern  . Not on file  Social History Narrative  . Not on file    Allergies:  Allergies  Allergen Reactions  . Oxycodone Itching  . Wellbutrin [Bupropion] Anxiety    Metabolic Disorder Labs: Lab Results  Component Value Date   HGBA1C 6.5 (H) 11/18/2017   No results found for: PROLACTIN Lab Results  Component Value Date   CHOL 120 07/26/2017   TRIG 94 07/26/2017   HDL 43 07/26/2017   CHOLHDL 2.8 07/26/2017   LDLCALC 58 07/26/2017   LDLCALC 140 (H) 02/28/2017   Lab Results  Component Value Date   TSH 1.700 11/18/2017   TSH 1.420 02/28/2017    Therapeutic Level Labs: No results found for: LITHIUM No results found for: VALPROATE No components found for:  CBMZ  Current Medications: Current Outpatient Medications  Medication Sig Dispense Refill  . atorvastatin (LIPITOR) 20 MG tablet TAKE ONE TABLET BY MOUTH DAILY 30 tablet 5  . b complex vitamins capsule Take 1 capsule by mouth daily.    . busPIRone (BUSPAR) 10 MG tablet Take 1 tablet (10 mg total) by mouth 3 (three) times daily. 270 tablet 0  . chlorhexidine (HIBICLENS) 4 % external liquid Apply topically daily as needed. 120 mL 1  . FLUoxetine (PROZAC) 40 MG capsule Take 1 capsule (40 mg total) by mouth daily. 90 capsule 0  . lisinopril (PRINIVIL,ZESTRIL) 5 MG tablet Take 5 mg by mouth daily.    . meloxicam (MOBIC) 15 MG tablet Take 1 tablet (15 mg total) by mouth daily. 30 tablet 2  . metFORMIN (GLUCOPHAGE) 500 MG tablet TAKE ONE TABLET BY MOUTH TWICE A DAY WITH A MEAL 60 tablet 2  . Multiple Vitamin (MULTIVITAMIN) tablet Take 1 tablet by mouth  daily.    . Omega-3 1000 MG CAPS Take 1 tablet by mouth daily.    . traZODone (DESYREL) 50 MG tablet Take 1.5-2 tablets (75-100 mg total) by mouth at bedtime as needed for sleep. 180 tablet 0  . Brexpiprazole (REXULTI) 0.5 MG TABS Take 1 tablet (0.5 mg total) by mouth at bedtime. 90 tablet 0   No current facility-administered medications for this visit.      Musculoskeletal: Strength & Muscle Tone: within normal limits Gait & Station: normal Patient leans: N/A  Psychiatric  Specialty Exam: Review of Systems  Psychiatric/Behavioral: Positive for depression. The patient is nervous/anxious.   All other systems reviewed and are negative.   Blood pressure 124/80, pulse 77, temperature 98.2 F (36.8 C), temperature source Oral, weight 231 lb 12.8 oz (105.1 kg).Body mass index is 35.77 kg/m.  General Appearance: Casual  Eye Contact:  Good  Speech:  Clear and Coherent  Volume:  Normal  Mood:  Anxious, Depressed and Dysphoric  Affect:  Appropriate  Thought Process:  Goal Directed and Descriptions of Associations: Intact  Orientation:  Full (Time, Place, and Person)  Thought Content: Logical   Suicidal Thoughts:  No  Homicidal Thoughts:  No  Memory:  Immediate;   Fair Recent;   Fair Remote;   Fair  Judgement:  Fair  Insight:  Fair  Psychomotor Activity:  Normal  Concentration:  Concentration: Fair and Attention Span: Fair  Recall:  [employer] of Knowledge: Fair  Language: Fair  Akathisia:  No  Handed:  Right  AIMS (if indicated): na  Assets:  Communication Skills Desire for Improvement Social Support  ADL's:  Intact  Cognition: WNL  Sleep:  Fair   Screenings: GAD-7     Office Visit from 09/11/2017 in [facility] Visit from 07/26/2017 in [facility] Visit from 06/28/2017 in [facility] Visit from 03/29/2017 in [facility] Visit from 02/28/2017 in [facility]  Total GAD-7  Score  7  11  10  8  6     PHQ2-9     Office Visit from 09/11/2017 in [facility] Visit from 07/26/2017 in [facility] Visit from 06/28/2017 in [facility] Visit from 03/29/2017 in [facility] Visit from 02/28/2017 in [facility]  PHQ-2 Total Score  6  6  0  1  2  PHQ-9 Total Score  17  17  10  5  4        Assessment and Plan: Quintana is a 57 year old Caucasian female, retired, lives in [place], has a history of depression, OSA, diabetes mellitus, hyperlipidemia, presented to the clinic today for a follow-up visit.  Patient reports she continues to struggle with mood symptoms.  Discussed medication changes as noted below.  She has also started psychotherapy session and has upcoming appointment scheduled.  Plan MDD Continue Prozac 40 mg p.o. daily Patient has tried 60 mg in the past and it gave her side effects. BuSpar 10 mg p.o. 3 times daily.  Patient however does report some headaches and lightheadedness.  Unknown if this is due to the BuSpar.  Discussed with her she can go back to her previous dosage of 5 mg twice a day if she continues to have the side effects. Add Rexulti 0.5 mg po daily.  GAD Prozac as prescribed BuSpar as prescribed Patient will continue psychotherapy sessions.  For insomnia Trazodone 100 mg p.o. nightly as needed OSA compliant with CPAP  Follow-up in clinic in 4 weeks or sooner if needed.  More than 50 % of the time was spent for psychoeducation and supportive psychotherapy and care coordination.  This note was generated in part or whole with voice recognition software. Voice recognition is usually quite accurate but there are transcription errors that can and very often do occur. I apologize for any typographical errors that were not detected and corrected.       Ursula Alert, MD 03/24/2018, 1:07 PM

## 2018-03-24 NOTE — Patient Instructions (Signed)
Brexpiprazole oral tablets What is this medicine? BREXPIPRAZOLE (brex PIP ray zole) is an antipsychotic. It is used to treat schizophrenia. This medicine is also used in combination with antidepressants to treat major depressive disorder. This medicine may be used for other purposes; ask your health care provider or pharmacist if you have questions. COMMON BRAND NAME(S): REXULTI What should I tell my health care provider before I take this medicine? They need to know if you have any of these conditions: -dementia -diabetes -difficulty swallowing -heart disease -high cholesterol or high levels of triglycerides in the blood -history of stroke -kidney disease -liver disease -low blood counts, like low white cell, platelet, or red cell counts -Parkinson's disease -seizures -suicidal thoughts, plans, or attempt; a previous suicide attempt by you or a family member -an unusual or allergic reaction to bexpiprazole, other medicines, foods, dyes, or preservatives -pregnant or trying to get pregnant -breast-feeding How should I use this medicine? Take this medicine by mouth with a glass of water. Follow the directions on the prescription label. You can take this medicine with or without food. Take your doses at regular intervals. Do not take your medicine more often than directed. Do not stop taking except on the advice of your doctor or health care professional. A special MedGuide will be given to you by the pharmacist with each prescription and refill. Be sure to read this information carefully each time. Talk to your pediatrician regarding the use of this medicine in children. Special care may be needed. Overdosage: If you think you have taken too much of this medicine contact a poison control center or emergency room at once. NOTE: This medicine is only for you. Do not share this medicine with others. What if I miss a dose? If you miss a dose, take it as soon as you can. If it is almost time  for your next dose, take only that dose. Do not take double or extra doses. What may interact with this medicine? Do not take this medicine with any of the following medications: -aripiprazole -metoclopramide This medicine may also interact with the following medications: -clarithromycin -certain medicines for blood pressure -certain medicines for fungal infections like fluconazole, itraconazole, ketoconazole -duloxetine -fluoxetine -paroxetine -quinidine -rifampin -St. John's Wort This list may not describe all possible interactions. Give your health care provider a list of all the medicines, herbs, non-prescription drugs, or dietary supplements you use. Also tell them if you smoke, drink alcohol, or use illegal drugs. Some items may interact with your medicine. What should I watch for while using this medicine? Visit your doctor or health care professional for regular checks on your progress. It may be several weeks before you see the full effects of this medicine. Notify your doctor or health care professional if your symptoms get worse, if you have new symptoms, if you are having an unusual effect from this medicine, or if you feel out of control, very discouraged or think you might harm yourself or others. Do not suddenly stop taking this medicine. You may need to gradually reduce the dose. Ask your doctor or health care professional for advice. You may get dizzy or drowsy. Do not drive, use machinery, or do anything that needs mental alertness until you know how this medicine affects you. Do not stand or sit up quickly, especially if you are an older patient. This reduces the risk of dizzy or fainting spells. Alcohol can increase dizziness and drowsiness. Avoid alcoholic drinks. This medicine may cause dry eyes and   blurred vision. If you wear contact lenses you may feel some discomfort. Lubricating drops may help. See your eye doctor if the problem does not go away or is severe. This  medicine can reduce the response of your body to heat or cold. Dress warm in cold weather and stay hydrated in hot weather. If possible, avoid extreme temperatures like saunas, hot tubs, very hot or cold showers, or activities that can cause dehydration such as vigorous exercise. What side effects may I notice from receiving this medicine? Side effects that you should report to your doctor or health care professional as soon as possible: -allergic reactions like skin rash, itching or hives, swelling of the face, lips, or tongue -breathing problems -confusion -feeling faint or lightheaded, falls -fever or chills, sore throat -increased hunger or thirst -increased urination -problems with balance, talking, walking -restlessness or need to keep moving -seizures -suicidal thoughts or other mood changes -trouble swallowing -uncontrollable head, mouth, neck, arm, or leg movements -unusually weak or tired Side effects that usually do not require medical attention (report to your doctor or health care professional if they continue or are bothersome): -constipation -drowsiness -headache -stomach upset -weight gain This list may not describe all possible side effects. Call your doctor for medical advice about side effects. You may report side effects to FDA at 1-800-FDA-1088. Where should I keep my medicine? Keep out of the reach of children. Store at room temperature between 15 and 30 degrees C (59 and 86 degrees F). Throw away any unused medicine after the expiration date. NOTE: This sheet is a summary. It may not cover all possible information. If you have questions about this medicine, talk to your doctor, pharmacist, or health care provider.  2018 Elsevier/Gold Standard (2015-07-21 09:17:04)  

## 2018-03-27 ENCOUNTER — Ambulatory Visit: Payer: BLUE CROSS/BLUE SHIELD | Admitting: Licensed Clinical Social Worker

## 2018-04-03 ENCOUNTER — Telehealth: Payer: Self-pay

## 2018-04-03 NOTE — Telephone Encounter (Signed)
Can we give her samples Angelica Medina ?

## 2018-04-03 NOTE — Telephone Encounter (Signed)
rexulti .5mg  is not covered by pt insurance.  ":denied:

## 2018-04-04 ENCOUNTER — Telehealth: Payer: Self-pay | Admitting: Psychiatry

## 2018-04-04 NOTE — Telephone Encounter (Signed)
Called patient to discuss other medication options since Rexulti will not be covered by her health insurance plan  Discussed other options like Abilify, seroquel. The other medications based on formulary list - geodon, ripseridone, invega,zyprexa.  Pt however reports she wants to hold off since she is worried about weight gain .  She will let writer know if she changes her mind.

## 2018-04-08 NOTE — Telephone Encounter (Signed)
I have left 2 messages for patient to return call or to stop by our office to pick up sample.

## 2018-04-11 ENCOUNTER — Ambulatory Visit (INDEPENDENT_AMBULATORY_CARE_PROVIDER_SITE_OTHER): Payer: BLUE CROSS/BLUE SHIELD | Admitting: Licensed Clinical Social Worker

## 2018-04-11 DIAGNOSIS — F411 Generalized anxiety disorder: Secondary | ICD-10-CM

## 2018-04-11 DIAGNOSIS — F330 Major depressive disorder, recurrent, mild: Secondary | ICD-10-CM | POA: Diagnosis not present

## 2018-04-14 ENCOUNTER — Other Ambulatory Visit: Payer: Self-pay | Admitting: Family Medicine

## 2018-04-14 MED ORDER — LISINOPRIL 5 MG PO TABS: 5.0000 mg | ORAL_TABLET | Freq: Every day | ORAL | 3 refills | Status: DC

## 2018-04-14 NOTE — Telephone Encounter (Signed)
[facility] faxed refill request for the following medications:  lisinopril (PRINIVIL,ZESTRIL) 5 MG tablet  90 day supply  Last Rx: I don't see that we have prescribed this medication for pt in the past. LOV: 10/3017 Please advise. Thanks TNP

## 2018-04-22 ENCOUNTER — Ambulatory Visit: Payer: BLUE CROSS/BLUE SHIELD | Admitting: Licensed Clinical Social Worker

## 2018-04-23 ENCOUNTER — Ambulatory Visit (INDEPENDENT_AMBULATORY_CARE_PROVIDER_SITE_OTHER): Payer: BLUE CROSS/BLUE SHIELD | Admitting: Psychiatry

## 2018-04-23 ENCOUNTER — Other Ambulatory Visit: Payer: Self-pay

## 2018-04-23 ENCOUNTER — Encounter: Payer: Self-pay | Admitting: Psychiatry

## 2018-04-23 VITALS — BP 121/76 | HR 73 | Temp 98.1°F | Wt 224.0 lb

## 2018-04-23 DIAGNOSIS — F330 Major depressive disorder, recurrent, mild: Secondary | ICD-10-CM | POA: Diagnosis not present

## 2018-04-23 DIAGNOSIS — F411 Generalized anxiety disorder: Secondary | ICD-10-CM

## 2018-04-23 DIAGNOSIS — G4700 Insomnia, unspecified: Secondary | ICD-10-CM | POA: Diagnosis not present

## 2018-04-23 MED ORDER — TRAZODONE HCL 50 MG PO TABS: 75.0000 mg | ORAL_TABLET | Freq: Every evening | ORAL | 0 refills | Status: DC | PRN

## 2018-04-23 MED ORDER — BUSPIRONE HCL 15 MG PO TABS: 15.0000 mg | ORAL_TABLET | Freq: Three times a day (TID) | ORAL | 0 refills | Status: DC

## 2018-04-23 MED ORDER — FLUOXETINE HCL 40 MG PO CAPS: 40.0000 mg | ORAL_CAPSULE | Freq: Every day | ORAL | 0 refills | Status: DC

## 2018-04-23 NOTE — Progress Notes (Signed)
[facility] MD OP Progress Note  04/23/2018 9:23 AM Angelica Medina  MRN:  [id number]  Chief Complaint: ' I am here for follow up.' Chief Complaint    Follow-up; Medication Refill     HPI: Angelica Medina is a 57 year old Caucasian female, unemployed, lives in [place], has a history of depression, anxiety, presented to the clinic today for a follow-up visit.  Patient today reports she has seen some improvement in her depressive symptoms.  She reports she feels more motivated, less tired.  She however continues to struggle with nervousness.  She reports she does not know what the triggers it, however is motivated to be mindful of that from now on.  Patient reports she is compliant with her medications.  Patient denies any side effects.  She did have some side effects to BuSpar last visit however reports she does not have it anymore.  She believes her symptoms may have been due to her upper respiratory infection that she was struggling with at that time.  Patient reports sleep is improved.  Patient denies any suicidality.  Patient denies any homicidality.  Patient continues to be in psychotherapy sessions with Ms. [name] which is going well. Visit Diagnosis:    ICD-10-CM   1. GAD (generalized anxiety disorder) F41.1 busPIRone (BUSPAR) 15 MG tablet    FLUoxetine (PROZAC) 40 MG capsule  2. MDD (major depressive disorder), recurrent episode, mild (HCC) F33.0 busPIRone (BUSPAR) 15 MG tablet    FLUoxetine (PROZAC) 40 MG capsule    traZODone (DESYREL) 50 MG tablet   improving  3. Insomnia, unspecified type G47.00    improved    Past Psychiatric History: Reviewed past psychiatric history from my progress note on 10/21/2017.  Past trials of Lexapro, Cymbalta, Paxil, Wellbutrin, Prozac.  Past Medical History:  Past Medical History:  Diagnosis Date  . Anxiety   . Depression   . Hypertension 2016  . Sleep apnea   . T2DM (type 2 diabetes mellitus) ([place]) 2016   diet controlled    Past Surgical History:   Procedure Laterality Date  . [place]   for herniated disc, no hardware  . BREAST BIOPSY Left 2009   benign  . ECTOPIC PREGNANCY SURGERY    . ESOPHAGEAL DILATION  2014   has had several i nthe past for achalasia  . OPEN REDUCTION INTERNAL FIXATION (ORIF) DISTAL RADIAL FRACTURE Left 09/17/2017   Procedure: OPEN REDUCTION INTERNAL FIXATION (ORIF) DISTAL RADIAL FRACTURE;  Surgeon: Lovell Sheehan, MD;  Location: [facility] ORS;  Service: Orthopedics;  Laterality: Left;    Family Psychiatric History: Reviewed family psychiatric history from my progress note on 10/21/2017  Family History:  Family History  Problem Relation Age of Onset  . Thyroid cancer Mother   . Anxiety disorder Mother   . Depression Mother   . Depression Father   . Diabetes Father   . COPD Father   . Anxiety disorder Father   . Hypothyroidism Sister   . Anxiety disorder Sister   . Healthy Brother   . Anxiety disorder Brother   . Anxiety disorder Brother   . Anxiety disorder Sister   . Depression Sister   . Drug abuse Paternal Aunt   . Alcohol abuse Maternal Grandfather   . Alcohol abuse Paternal Grandfather   . Alcohol abuse Paternal Grandmother   . Drug abuse Other     Social History: Reviewed social history from my progress note on 10/21/2017. Social History   Socioeconomic History  . Marital status: Married  Spouse name: [name]  . Number of children: 3  . Years of education: 16  . Highest education level: High school graduate  Occupational History  . 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 Financial resource strain: Not hard at all  . Food insecurity:    Worry: Never true    Inability: Never true  . Transportation needs:    Medical: No    Non-medical: No  Tobacco Use  . Smoking status: Former Smoker    Packs/day: 0.50    Years: 20.00    Pack years: 10.00    Last attempt to quit: 07/01/1994    Years since quitting: 23.8  . Smokeless tobacco: Never Used  Substance and  Sexual Activity  . Alcohol use: No  . Drug use: No  . Sexual activity: Not Currently  Lifestyle  . Physical activity:    Days per week: 0 days    Minutes per session: 0 min  . Stress: Rather much  Relationships  . Social connections:    Talks on phone: Three times a week    Gets together: More than three times a week    Attends religious service: More than 4 times per year    Active member of club or organization: No    Attends meetings of clubs or organizations: Never    Relationship status: Married  Other Topics Concern  . Not on file  Social History Narrative  . Not on file    Allergies:  Allergies  Allergen Reactions  . Oxycodone Itching  . Wellbutrin [Bupropion] Anxiety    Metabolic Disorder Labs: Lab Results  Component Value Date   HGBA1C 6.5 (H) 11/18/2017   No results found for: PROLACTIN Lab Results  Component Value Date   CHOL 120 07/26/2017   TRIG 94 07/26/2017   HDL 43 07/26/2017   CHOLHDL 2.8 07/26/2017   LDLCALC 58 07/26/2017   LDLCALC 140 (H) 02/28/2017   Lab Results  Component Value Date   TSH 1.700 11/18/2017   TSH 1.420 02/28/2017    Therapeutic Level Labs: No results found for: LITHIUM No results found for: VALPROATE No components found for:  CBMZ  Current Medications: Current Outpatient Medications  Medication Sig Dispense Refill  . atorvastatin (LIPITOR) 20 MG tablet TAKE ONE TABLET BY MOUTH DAILY 30 tablet 5  . b complex vitamins capsule Take 1 capsule by mouth daily.    . chlorhexidine (HIBICLENS) 4 % external liquid Apply topically daily as needed. 120 mL 1  . FLUoxetine (PROZAC) 40 MG capsule Take 1 capsule (40 mg total) by mouth daily. 90 capsule 0  . lisinopril (PRINIVIL,ZESTRIL) 5 MG tablet Take 1 tablet (5 mg total) by mouth daily. 90 tablet 3  . meloxicam (MOBIC) 15 MG tablet Take 1 tablet (15 mg total) by mouth daily. 30 tablet 2  . metFORMIN (GLUCOPHAGE) 500 MG tablet TAKE ONE TABLET BY MOUTH TWICE A DAY WITH A MEAL 60  tablet 2  . Multiple Vitamin (MULTIVITAMIN) tablet Take 1 tablet by mouth daily.    . Omega-3 1000 MG CAPS Take 1 tablet by mouth daily.    . traZODone (DESYREL) 50 MG tablet Take 1.5-2 tablets (75-100 mg total) by mouth at bedtime as needed for sleep. 180 tablet 0  . busPIRone (BUSPAR) 15 MG tablet Take 1 tablet (15 mg total) by mouth 3 (three) times daily. 270 tablet 0   No current facility-administered medications for this visit.      Musculoskeletal: Strength &  Muscle Tone: within normal limits Gait & Station: normal Patient leans: N/A  Psychiatric Specialty Exam: Review of Systems  Psychiatric/Behavioral: Positive for depression (improved). The patient is nervous/anxious.   All other systems reviewed and are negative.   Blood pressure 121/76, pulse 73, temperature 98.1 F (36.7 C), temperature source Oral, weight 224 lb (101.6 kg).Body mass index is 34.57 kg/m.  General Appearance: Casual  Eye Contact:  Fair  Speech:  Normal Rate  Volume:  Normal  Mood:  Anxious  Affect:  Congruent  Thought Process:  Goal Directed and Descriptions of Associations: Intact  Orientation:  Full (Time, Place, and Person)  Thought Content: Logical   Suicidal Thoughts:  No  Homicidal Thoughts:  No  Memory:  Immediate;   Fair Recent;   Fair Remote;   Fair  Judgement:  Fair  Insight:  Fair  Psychomotor Activity:  Normal  Concentration:  Concentration: Fair and Attention Span: Fair  Recall:  [employer] of Knowledge: Fair  Language: Fair  Akathisia:  No  Handed:  Right  AIMS (if indicated): na  Assets:  Communication Skills Desire for Improvement Social Support  ADL's:  Intact  Cognition: WNL  Sleep:  Fair   Screenings: GAD-7     Office Visit from 09/11/2017 in [facility] Visit from 07/26/2017 in [facility] Visit from 06/28/2017 in [facility] Visit from 03/29/2017 in [facility] Visit from  02/28/2017 in [facility]  Total GAD-7 Score  7  11  10  8  6     PHQ2-9     Office Visit from 09/11/2017 in [facility] Visit from 07/26/2017 in [facility] Visit from 06/28/2017 in [facility] Visit from 03/29/2017 in [facility] Visit from 02/28/2017 in [facility]  PHQ-2 Total Score  6  6  0  1  2  PHQ-9 Total Score  17  17  10  5  4        Assessment and Plan: Jadda is a 57 year old Caucasian female, retired, lives in [place], has a history of depression, OSA, diabetes mellitus, hyperlipidemia, presented to the clinic today for a follow-up visit.  Patient reports some improvement in her depressive symptoms are continues to struggle with anxiety symptoms.  We will continue to make medication changes.  She will also continue psychotherapy sessions.  Plan MDD Continue Prozac 40 mg p.o. daily.  She has tried 60 mg in the past and it gave her side effects. Increase BuSpar to 15 mg p.o. 3 times daily. Rexulti was prescribed however it was not covered by her health insurance plan. Discussed other alternative options however patient reports she does not want to try medications like Abilify, Seroquel and so on at this time due to her concerns of weight gain and side effects.  For insomnia Trazodone 100 mg p.o. nightly as needed OSA compliant with CPAP  For GAD Increase BuSpar as noted above. Continue Prozac.Continue CBT. Discussed with patient to observe her triggers, and to bring it into her sessions for her CBT.  Follow-up in clinic in 4 weeks or sooner if needed.  More than 50 % of the time was spent for psychoeducation and supportive psychotherapy and care coordination.  This note was generated in part or whole with voice recognition software. Voice recognition is usually quite accurate but there are transcription errors that can and very often do occur. I apologize for  any typographical  errors that were not detected and corrected.       Ursula Alert, MD 04/23/2018, 9:23 AM

## 2018-05-06 NOTE — Progress Notes (Signed)
   THERAPIST PROGRESS NOTE  Session Time: 42mn  Participation Level: Active  Behavioral Response: CasualAlertAnxious  Type of Therapy: Individual Therapy  Treatment Goals addressed: Anxiety  Interventions: CBT and Motivational Interviewing  Summary: SNormajean Nashis a 57y.o. female who presents with a reduction in symptoms.  Therapist met with Patient in an outpatient setting to assess current mood and assist with making progress towards goals through the use of therapeutic intervention. Therapist did a brief mood check, assessing anger, fear, disgust, excitement, happiness, and sadness.  Patient reports "favorable" mood.  Patient listed her frustration and discussed coping skills that have been useful.  Therapist provided guided imagery to assist with relaxation after Patient became anxious.  Discussion of how useful relaxation can be when stressed or anxious.  Assisted patient with guided imagery again and allowed her time to review on [email or web address]    Suicidal/Homicidal: No   Plan: Return again in 2 weeks.  Diagnosis: Axis I: Generalized Anxiety Disorder    Axis II: No diagnosis    NLubertha South LCSW 04/11/2018

## 2018-05-08 ENCOUNTER — Ambulatory Visit (INDEPENDENT_AMBULATORY_CARE_PROVIDER_SITE_OTHER): Payer: BLUE CROSS/BLUE SHIELD | Admitting: Licensed Clinical Social Worker

## 2018-05-08 DIAGNOSIS — F411 Generalized anxiety disorder: Secondary | ICD-10-CM | POA: Diagnosis not present

## 2018-05-09 NOTE — Progress Notes (Signed)
   THERAPIST PROGRESS NOTE  Session Time: 30 min  Participation Level: Active  Behavioral Response: CasualAlertTearful  Type of Therapy: Individual Therapy  Treatment Goals addressed: Coping  Interventions: CBT and Motivational Interviewing  Summary: Angelica Medina is a 57 y.o. female who presents with continued symptoms of her diagnosis. Patient reports that today she woke up around 4am and attempted to talk to an old friend.  She reports that the friend did not appear to want to converse with her.  Patient reports that she feels worthless. Actively listened and normalized feelings.  Educated Patient about cognitive distortions and encouraged her to use self affirmations during session.  Modeled self affirmations and gave homework of self affirmations in the mirror at least 3 times per week.  Suicidal/Homicidal: No  Plan: Return again in 2 weeks.  Diagnosis: Axis I: Generalized Anxiety Disorder    Axis II: No diagnosis    Lubertha South, LCSW 05/08/2018

## 2018-05-14 ENCOUNTER — Other Ambulatory Visit: Payer: Self-pay | Admitting: Family Medicine

## 2018-05-19 ENCOUNTER — Other Ambulatory Visit: Payer: Self-pay

## 2018-05-19 ENCOUNTER — Ambulatory Visit (INDEPENDENT_AMBULATORY_CARE_PROVIDER_SITE_OTHER): Payer: BLUE CROSS/BLUE SHIELD | Admitting: Psychiatry

## 2018-05-19 ENCOUNTER — Encounter: Payer: Self-pay | Admitting: Psychiatry

## 2018-05-19 VITALS — BP 117/77 | HR 74 | Temp 97.8°F | Wt 210.6 lb

## 2018-05-19 DIAGNOSIS — F411 Generalized anxiety disorder: Secondary | ICD-10-CM

## 2018-05-19 DIAGNOSIS — G4700 Insomnia, unspecified: Secondary | ICD-10-CM | POA: Diagnosis not present

## 2018-05-19 DIAGNOSIS — F330 Major depressive disorder, recurrent, mild: Secondary | ICD-10-CM | POA: Diagnosis not present

## 2018-05-19 MED ORDER — BUSPIRONE HCL 15 MG PO TABS: 15.0000 mg | ORAL_TABLET | Freq: Three times a day (TID) | ORAL | 0 refills | Status: DC

## 2018-05-19 MED ORDER — ARIPIPRAZOLE 2 MG PO TABS: 2.0000 mg | ORAL_TABLET | Freq: Every day | ORAL | 0 refills | Status: DC

## 2018-05-19 NOTE — Progress Notes (Signed)
[facility] MD OP Progress Note  05/19/2018 5:39 PM Angelica Medina  MRN:  [id number]  Chief Complaint: ' I am here for follow up." Chief Complaint    Follow-up; Medication Refill     HPI: Angelica Medina is a 57 year old Caucasian female, unemployed, lives in [place], has a history of depression, anxiety, presented to the clinic today for a follow-up visit.  Patient today reports she continues to struggle with depressive symptoms.  She reports she struggles with lack of motivation, anhedonia on a regular basis.  She reports she is able to push herself into doing things however it is a constant struggle.  She reports she is worried about the fact that several family members struggle with the same depressive symptoms.  She reports her dad used to struggle with significant depressive symptoms in the past.  She does not want to be like her dad who had a lot of problems and did not tolerate medications well.  Discussed adding Abilify.  Patient however reports she is worried about the side effects including weight gain.  Patient reports she is interested in genetic testing today.  Provided her medication education.  Provided her information about genetic testing.  After a brief discussion patient agreed to start low-dose Abilify.  Patient denies any suicidality.  Patient denies any perceptual disturbances.  Patient reports sleep is fair on the current medication regimen.  Patient looks forward to Thanksgiving holidays and is planning to spend it with her family in [place]. Visit Diagnosis:    ICD-10-CM   1. GAD (generalized anxiety disorder) F41.1 ARIPiprazole (ABILIFY) 2 MG tablet  2. MDD (major depressive disorder), recurrent episode, mild (HCC) F33.0 ARIPiprazole (ABILIFY) 2 MG tablet  3. Insomnia, unspecified type G47.00     Past Psychiatric History: I have reviewed past psychiatric history from my progress note on 10/21/2017.  Past trials of Lexapro, Cymbalta, Paxil, Wellbutrin, Prozac  Past Medical  History:  Past Medical History:  Diagnosis Date  . Anxiety   . Depression   . Hypertension 2016  . Sleep apnea   . T2DM (type 2 diabetes mellitus) ([facility]) 2016   diet controlled    Past Surgical History:  Procedure Laterality Date  . Petron[place]   for herniated disc, no hardware  . BREAST BIOPSY Left 2009   benign  . ECTOPIC PREGNANCY SURGERY    . ESOPHAGEAL DILATION  2014   has had several i nthe past for achalasia  . OPEN REDUCTION INTERNAL FIXATION (ORIF) DISTAL RADIAL FRACTURE Left 09/17/2017   Procedure: OPEN REDUCTION INTERNAL FIXATION (ORIF) DISTAL RADIAL FRACTURE;  Surgeon: Lovell Sheehan, MD;  Location: [facility] ORS;  Service: Orthopedics;  Laterality: Left;    Family Psychiatric History: Have reviewed family psychiatric history from my progress note on 10/21/2017 Family History:  Family History  Problem Relation Age of Onset  . Thyroid cancer Mother   . Anxiety disorder Mother   . Depression Mother   . Depression Father   . Diabetes Father   . COPD Father   . Anxiety disorder Father   . Hypothyroidism Sister   . Anxiety disorder Sister   . Healthy Brother   . Anxiety disorder Brother   . Anxiety disorder Brother   . Anxiety disorder Sister   . Depression Sister   . Drug abuse Paternal Aunt   . Alcohol abuse Maternal Grandfather   . Alcohol abuse Paternal Grandfather   . Alcohol abuse Paternal Grandmother   . Drug abuse Other  Social History: Reviewed social history from my progress note on 10/21/2017. Social History   Socioeconomic History  . Marital status: Married    Spouse name: [name]  . Number of children: 3  . Years of education: 89  . Highest education level: High school graduate  Occupational History  . 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 Financial resource strain: Not hard at all  . Food insecurity:    Worry: Never true    Inability: Never true  . Transportation needs:    Medical: No    Non-medical: No  Tobacco  Use  . Smoking status: Former Smoker    Packs/day: 0.50    Years: 20.00    Pack years: 10.00    Last attempt to quit: 07/01/1994    Years since quitting: 23.8  . Smokeless tobacco: Never Used  Substance and Sexual Activity  . Alcohol use: No  . Drug use: No  . Sexual activity: Not Currently  Lifestyle  . Physical activity:    Days per week: 0 days    Minutes per session: 0 min  . Stress: Rather much  Relationships  . Social connections:    Talks on phone: Three times a week    Gets together: More than three times a week    Attends religious service: More than 4 times per year    Active member of club or organization: No    Attends meetings of clubs or organizations: Never    Relationship status: Married  Other Topics Concern  . Not on file  Social History Narrative  . Not on file    Allergies:  Allergies  Allergen Reactions  . Oxycodone Itching  . Wellbutrin [Bupropion] Anxiety    Metabolic Disorder Labs: Lab Results  Component Value Date   HGBA1C 6.5 (H) 11/18/2017   No results found for: PROLACTIN Lab Results  Component Value Date   CHOL 120 07/26/2017   TRIG 94 07/26/2017   HDL 43 07/26/2017   CHOLHDL 2.8 07/26/2017   LDLCALC 58 07/26/2017   LDLCALC 140 (H) 02/28/2017   Lab Results  Component Value Date   TSH 1.700 11/18/2017   TSH 1.420 02/28/2017    Therapeutic Level Labs: No results found for: LITHIUM No results found for: VALPROATE No components found for:  CBMZ  Current Medications: Current Outpatient Medications  Medication Sig Dispense Refill  . ARIPiprazole (ABILIFY) 2 MG tablet Take 1 tablet (2 mg total) by mouth daily. 30 tablet 0  . atorvastatin (LIPITOR) 20 MG tablet TAKE ONE TABLET BY MOUTH DAILY 30 tablet 5  . b complex vitamins capsule Take 1 capsule by mouth daily.    . busPIRone (BUSPAR) 15 MG tablet Take 1 tablet (15 mg total) by mouth 3 (three) times daily. 270 tablet 0  . chlorhexidine (HIBICLENS) 4 % external liquid Apply  topically daily as needed. 120 mL 1  . FLUoxetine (PROZAC) 40 MG capsule Take 1 capsule (40 mg total) by mouth daily. 90 capsule 0  . lisinopril (PRINIVIL,ZESTRIL) 5 MG tablet Take 1 tablet (5 mg total) by mouth daily. 90 tablet 3  . meloxicam (MOBIC) 15 MG tablet Take 1 tablet (15 mg total) by mouth daily. 30 tablet 2  . metFORMIN (GLUCOPHAGE) 500 MG tablet TAKE ONE TABLET BY MOUTH TWICE A DAY WITH A MEAL 180 tablet 1  . Multiple Vitamin (MULTIVITAMIN) tablet Take 1 tablet by mouth daily.    . Omega-3 1000 MG CAPS Take 1 tablet by mouth  daily.    . traZODone (DESYREL) 50 MG tablet Take 1.5-2 tablets (75-100 mg total) by mouth at bedtime as needed for sleep. 180 tablet 0   No current facility-administered medications for this visit.      Musculoskeletal: Strength & Muscle Tone: within normal limits Gait & Station: normal Patient leans: N/A  Psychiatric Specialty Exam: Review of Systems  Psychiatric/Behavioral: Positive for depression. The patient is nervous/anxious.   All other systems reviewed and are negative.   Blood pressure 117/77, pulse 74, temperature 97.8 F (36.6 C), temperature source Oral, weight 210 lb 9.6 oz (95.5 kg).Body mass index is 32.5 kg/m.  General Appearance: Casual  Eye Contact:  Fair  Speech:  Clear and Coherent  Volume:  Normal  Mood:  Dysphoric  Affect:  Appropriate  Thought Process:  Goal Directed and Descriptions of Associations: Intact  Orientation:  Full (Time, Place, and Person)  Thought Content: Logical   Suicidal Thoughts:  No  Homicidal Thoughts:  No  Memory:  Immediate;   Fair Recent;   Fair Remote;   Fair  Judgement:  Fair  Insight:  Fair  Psychomotor Activity:  Normal  Concentration:  Concentration: Fair and Attention Span: Fair  Recall:  [employer] of Knowledge: Fair  Language: Fair  Akathisia:  No  Handed:  Right  AIMS (if indicated): na  Assets:  Communication Skills Desire for Improvement Social Support  ADL's:  Intact   Cognition: WNL  Sleep:  Fair   Screenings: GAD-7     Office Visit from 09/11/2017 in [facility] Visit from 07/26/2017 in [facility] Visit from 06/28/2017 in [facility] Visit from 03/29/2017 in [facility] Visit from 02/28/2017 in [facility]  Total GAD-7 Score  7  11  10  8  6     PHQ2-9     Office Visit from 09/11/2017 in [facility] Visit from 07/26/2017 in [facility] Visit from 06/28/2017 in [facility] Visit from 03/29/2017 in [facility] Visit from 02/28/2017 in [facility]  PHQ-2 Total Score  6  6  0  1  2  PHQ-9 Total Score  17  17  10  5  4        Assessment and Plan: Daleyza is a 57 year old Caucasian female, retired, lives in [place], has a history of depression, OSA, diabetes mellitus, hyperlipidemia, presented to the clinic today for a follow-up visit.  Patient reports she continues to struggle with depressive symptoms.  Discussed medication management as noted below.  Plan MDD Continue Prozac 40 mg p.o. daily.  She has tried 60 mg in the past and it gave her side effects. Continue BuSpar 15 mg p.o. 3 times daily Add Abilify 2 mg p.o. daily.  Provided medication education including her concerns about weight gain side effects and abnormal movements.  For insomnia Trazodone 100 mg p.o. nightly as needed OSA compliant with CPAP  For GAD BuSpar as noted above. Prozac at 40 mg p.o. daily Continue CBT.  Provided patient information about genetic testing- genesight testing  Follow-up in clinic in 2 weeks or sooner if needed.  More than 50 % of the time was spent for psychoeducation and supportive psychotherapy and care coordination.  This note was generated in part or whole with voice recognition software. Voice recognition is usually quite accurate but there are  transcription errors that can and very often do occur. I apologize for  any typographical errors that were not detected and corrected.           Ursula Alert, MD 05/19/2018, 5:39 PM

## 2018-05-19 NOTE — Patient Instructions (Signed)
Aripiprazole tablets What is this medicine? ARIPIPRAZOLE (ay ri PIP ray zole) is an atypical antipsychotic. It is used to treat schizophrenia and bipolar disorder, also known as manic-depression. It is also used to treat Tourette's disorder and some symptoms of autism. This medicine may also be used in combination with antidepressants to treat major depressive disorder. This medicine may be used for other purposes; ask your health care provider or pharmacist if you have questions. COMMON BRAND NAME(S): Abilify What should I tell my health care provider before I take this medicine? They need to know if you have any of these conditions: -dehydration -dementia -diabetes -heart disease -history of stroke -low blood counts, like low white cell, platelet, or red cell counts -Parkinson's disease -seizures -suicidal thoughts, plans, or attempt; a previous suicide attempt by you or a family member -an unusual or allergic reaction to aripiprazole, other medicines, foods, dyes, or preservatives -pregnant or trying to get pregnant -breast-feeding How should I use this medicine? Take this medicine by mouth with a glass of water. Follow the directions on the prescription label. You can take this medicine with or without food. Take your doses at regular intervals. Do not take your medicine more often than directed. Do not stop taking except on the advice of your doctor or health care professional. A special MedGuide will be given to you by the pharmacist with each prescription and refill. Be sure to read this information carefully each time. Talk to your pediatrician regarding the use of this medicine in children. While this drug may be prescribed for children as young as 6 years of age for selected conditions, precautions do apply. Overdosage: If you think you have taken too much of this medicine contact a poison control center or emergency room at once. NOTE: This medicine is only for you. Do not share  this medicine with others. What if I miss a dose? If you miss a dose, take it as soon as you can. If it is almost time for your next dose, take only that dose. Do not take double or extra doses. What may interact with this medicine? Do not take this medicine with any of the following medications: -brexpiprazole -cisapride -dofetilide -dronedarone -metoclopramide -pimozide -thioridazine This medicine may also interact with the following medications: -alcohol -carbamazepine -certain medicines for anxiety or sleep -certain medicines for blood pressure -certain medicines for fungal infections like ketoconazole, fluconazole, posaconazole, and itraconazole -clarithromycin -fluoxetine -other medicines that prolong the QT interval (cause an abnormal heart rhythm) -paroxetine -quinidine -rifampin This list may not describe all possible interactions. Give your health care provider a list of all the medicines, herbs, non-prescription drugs, or dietary supplements you use. Also tell them if you smoke, drink alcohol, or use illegal drugs. Some items may interact with your medicine. What should I watch for while using this medicine? Visit your doctor or health care professional for regular checks on your progress. It may be several weeks before you see the full effects of this medicine. Do not suddenly stop taking this medicine. You may need to gradually reduce the dose. Patients and their families should watch out for worsening depression or thoughts of suicide. Also watch out for sudden changes in feelings such as feeling anxious, agitated, panicky, irritable, hostile, aggressive, impulsive, severely restless, overly excited and hyperactive, or not being able to sleep. If this happens, especially at the beginning of antidepressant treatment or after a change in dose, call your health care professional. You may get dizzy or drowsy. Do   not drive, use machinery, or do anything that needs mental  alertness until you know how this medicine affects you. Do not stand or sit up quickly, especially if you are an older patient. This reduces the risk of dizzy or fainting spells. Alcohol can increase dizziness and drowsiness. Avoid alcoholic drinks. This medicine can reduce the response of your body to heat or cold. Dress warm in cold weather and stay hydrated in hot weather. If possible, avoid extreme temperatures like saunas, hot tubs, very hot or cold showers, or activities that can cause dehydration such as vigorous exercise. This medicine may cause dry eyes and blurred vision. If you wear contact lenses you may feel some discomfort. Lubricating drops may help. See your eye doctor if the problem does not go away or is severe. If you notice an increased hunger or thirst, different from your normal hunger or thirst, or if you find that you have to urinate more frequently, you should contact your health care provider as soon as possible. You may need to have your blood sugar monitored. This medicine may cause changes in your blood sugar levels. You should monitor you blood sugar frequently if you have diabetes. There have been reports of uncontrollable and strong urges to gamble, binge eat, shop, and have sex while taking this medicine. If you experience any of these or other uncontrollable and strong urges while taking this medicine, you should report it to your health care provider as soon as possible. What side effects may I notice from receiving this medicine? Side effects that you should report to your doctor or health care professional as soon as possible: -allergic reactions like skin rash, itching or hives, swelling of the face, lips, or tongue -breathing problems -confusion -feeling faint or lightheaded, falls -fever or chills, sore throat -increased hunger or thirst -increased urination -joint pain -muscles pain, spasms -problems with balance, talking, walking -restlessness or need to  keep moving -seizures -suicidal thoughts or other mood changes -trouble swallowing -uncontrollable and excessive urges (examples: gambling, binge eating, shopping, having sex) -uncontrollable head, mouth, neck, arm, or leg movements -unusually weak or tired Side effects that usually do not require medical attention (report to your doctor or health care professional if they continue or are bothersome): -blurred vision -constipation -headache -nausea, vomiting -trouble sleeping -weight gain This list may not describe all possible side effects. Call your doctor for medical advice about side effects. You may report side effects to FDA at 1-800-FDA-1088. Where should I keep my medicine? Keep out of the reach of children. Store at room temperature between 15 and 30 degrees C (59 and 86 degrees F). Throw away any unused medicine after the expiration date. NOTE: This sheet is a summary. It may not cover all possible information. If you have questions about this medicine, talk to your doctor, pharmacist, or health care provider.  2018 Elsevier/Gold Standard (2016-06-03 11:45:05)  

## 2018-05-21 ENCOUNTER — Encounter: Payer: Self-pay | Admitting: Family Medicine

## 2018-05-21 ENCOUNTER — Ambulatory Visit (INDEPENDENT_AMBULATORY_CARE_PROVIDER_SITE_OTHER): Payer: BLUE CROSS/BLUE SHIELD | Admitting: Family Medicine

## 2018-05-21 VITALS — BP 120/72 | HR 58 | Temp 98.4°F | Wt 212.2 lb

## 2018-05-21 DIAGNOSIS — F331 Major depressive disorder, recurrent, moderate: Secondary | ICD-10-CM | POA: Diagnosis not present

## 2018-05-21 DIAGNOSIS — Z23 Encounter for immunization: Secondary | ICD-10-CM | POA: Diagnosis not present

## 2018-05-21 DIAGNOSIS — Z6832 Body mass index (BMI) 32.0-32.9, adult: Secondary | ICD-10-CM

## 2018-05-21 DIAGNOSIS — E1142 Type 2 diabetes mellitus with diabetic polyneuropathy: Secondary | ICD-10-CM

## 2018-05-21 DIAGNOSIS — F419 Anxiety disorder, unspecified: Secondary | ICD-10-CM

## 2018-05-21 DIAGNOSIS — E782 Mixed hyperlipidemia: Secondary | ICD-10-CM

## 2018-05-21 DIAGNOSIS — E669 Obesity, unspecified: Secondary | ICD-10-CM

## 2018-05-21 DIAGNOSIS — I10 Essential (primary) hypertension: Secondary | ICD-10-CM

## 2018-05-21 LAB — POCT GLYCOSYLATED HEMOGLOBIN (HGB A1C): Hemoglobin A1C: 6.2 % — AB (ref 4.0–5.6)

## 2018-05-21 NOTE — Assessment & Plan Note (Signed)
Well-controlled with decreasing A1c Continue metformin 500 mg twice daily Discussed diet and exercise Foot exam completed today Patient to schedule eye exam Up-to-date on vaccinations Currently on ACE inhibitor, but we will be holding this If she continues to not need ACE inhibitor, we may need to start doing annual microalbumin again Follow-up in 6 months

## 2018-05-21 NOTE — Assessment & Plan Note (Signed)
Congratulated patient on her weight loss Discussed [place] diet Continue walking for exercise

## 2018-05-21 NOTE — Assessment & Plan Note (Signed)
Well-controlled with some symptoms of hypotension We will hold lisinopril Recheck CMP Follow-up in 6 months Patient will notify me if home blood pressure is running high after stopping lisinopril

## 2018-05-21 NOTE — Assessment & Plan Note (Signed)
Fairly well controlled Continue Prozac and BuSpar Followed by psychiatry and psychology

## 2018-05-21 NOTE — Assessment & Plan Note (Signed)
Control has improved significantly She is seeing psychiatry She is going to be starting Abilify She is also doing therapy

## 2018-05-21 NOTE — Progress Notes (Signed)
Patient: Angelica Medina Female    DOB: 1961-06-27   57 y.o.   MRN: [id number] Visit Date: 05/21/2018  Today's Provider: Lavon Paganini, MD   Chief Complaint  Patient presents with  . Diabetes  . Depression  . Anxiety   Subjective:    HPI  Diabetes Mellitus Type II, Follow-up:   Lab Results  Component Value Date   HGBA1C 6.5 (H) 11/18/2017   HGBA1C 7.2 06/28/2017   HGBA1C 6.8 (H) 02/28/2017    Last seen for diabetes 6 months ago.  Management since then includes no change, continue medication. She reports good compliance with treatment. She is not having side effects.  Current symptoms include none  Home blood sugar records: not being checked  Episodes of hypoglycemia? no   Current Insulin Regimen: none Most Recent Eye Exam: 05/02/2017 Weight trend: stable Prior visit with dietician: yes - Patient reported a long time ago Current diet: GOLO (clean-eating, insulin resistant diet) Current exercise: lightly  Pertinent Labs:    Component Value Date/Time   CHOL 120 07/26/2017 0855   TRIG 94 07/26/2017 0855   HDL 43 07/26/2017 0855   LDLCALC 58 07/26/2017 0855   CREATININE 0.67 07/26/2017 0855    Wt Readings from Last 3 Encounters:  05/21/18 212 lb 3.2 oz (96.3 kg)  11/28/17 238 lb (108 kg)  11/18/17 234 lb (106.1 kg)    HTN: - Medications: lisinopril 5mg  daily - Compliance: Good - Checking BP at home: No - Denies any SOB, CP, vision changes, LE edema, medication SEs -Reports episode of syncope while donating blood and occasional dizziness upon standing quickly  ------------------------------------------------------------------------  Anxiety & Depression Follow Up:  Patient presents for a 6 month follow up. Last OV was on 11/18/2017. Patient advised to continue medications. She reports good compliance with treatment plan. She states symptoms are stable. Patient states she saw her psychiatrist (Dr Shea Evans) on Monday and a new medication (Abilify) was  added, but she has not started the medication yet because the pharmacy did not have it in stock.  She is experiencing anhedonia.   GAD 7 : Generalized Anxiety Score 09/11/2017 07/26/2017 06/28/2017 03/29/2017  Nervous, Anxious, on Edge 2 2 3 3   Control/stop worrying 1 2 1  0  Worry too much - different things 1 2 1  0  Trouble relaxing 1 1 1 2   Restless 0 0 0 1  Easily annoyed or irritable 1 2 3 2   Afraid - awful might happen 1 2 1  0  Total GAD 7 Score 7 11 10 8   Anxiety Difficulty Somewhat difficult Very difficult Somewhat difficult -    Depression screen [facility] 2/9 09/11/2017 07/26/2017 06/28/2017  Decreased Interest 3 3 0  Down, Depressed, Hopeless 3 3 0  PHQ - 2 Score 6 6 0  Altered sleeping 3 3 3   Tired, decreased energy 3 3 3   Change in appetite 2 1 2   Feeling bad or failure about yourself  2 3 1   Trouble concentrating 1 1 1   Moving slowly or fidgety/restless 0 0 0  Suicidal thoughts 0 0 0  PHQ-9 Score 17 17 10   Difficult doing work/chores Very difficult Very difficult Not difficult at all      Allergies  Allergen Reactions  . Oxycodone Itching  . Wellbutrin [Bupropion] Anxiety     Current Outpatient Medications:  .  ARIPiprazole (ABILIFY) 2 MG tablet, Take 1 tablet (2 mg total) by mouth daily., Disp: 30 tablet, Rfl: 0 .  atorvastatin (  LIPITOR) 20 MG tablet, TAKE ONE TABLET BY MOUTH DAILY, Disp: 30 tablet, Rfl: 5 .  b complex vitamins capsule, Take 1 capsule by mouth daily., Disp: , Rfl:  .  busPIRone (BUSPAR) 15 MG tablet, Take 1 tablet (15 mg total) by mouth 3 (three) times daily., Disp: 270 tablet, Rfl: 0 .  chlorhexidine (HIBICLENS) 4 % external liquid, Apply topically daily as needed., Disp: 120 mL, Rfl: 1 .  FLUoxetine (PROZAC) 40 MG capsule, Take 1 capsule (40 mg total) by mouth daily., Disp: 90 capsule, Rfl: 0 .  lisinopril (PRINIVIL,ZESTRIL) 5 MG tablet, Take 1 tablet (5 mg total) by mouth daily., Disp: 90 tablet, Rfl: 3 .  meloxicam (MOBIC) 15 MG tablet, Take 1  tablet (15 mg total) by mouth daily., Disp: 30 tablet, Rfl: 2 .  metFORMIN (GLUCOPHAGE) 500 MG tablet, TAKE ONE TABLET BY MOUTH TWICE A DAY WITH A MEAL, Disp: 180 tablet, Rfl: 1 .  Multiple Vitamin (MULTIVITAMIN) tablet, Take 1 tablet by mouth daily., Disp: , Rfl:  .  Omega-3 1000 MG CAPS, Take 1 tablet by mouth daily., Disp: , Rfl:  .  traZODone (DESYREL) 50 MG tablet, Take 1.5-2 tablets (75-100 mg total) by mouth at bedtime as needed for sleep., Disp: 180 tablet, Rfl: 0  Review of Systems  Constitutional: Negative.   Respiratory: Negative.   Cardiovascular: Negative.   Gastrointestinal: Negative.   Genitourinary: Negative.   Neurological: Negative.   Psychiatric/Behavioral: Negative.     Social History   Tobacco Use  . Smoking status: Former Smoker    Packs/day: 0.50    Years: 20.00    Pack years: 10.00    Last attempt to quit: 07/01/1994    Years since quitting: 23.9  . Smokeless tobacco: Never Used  Substance Use Topics  . Alcohol use: No   Objective:   BP 120/72 (BP Location: Right Arm, Patient Position: Sitting, Cuff Size: Normal)   Pulse (!) 58   Temp 98.4 F (36.9 C) (Oral)   Wt 212 lb 3.2 oz (96.3 kg)   SpO2 99%   BMI 32.74 kg/m  Vitals:   05/21/18 0810  BP: 120/72  Pulse: (!) 58  Temp: 98.4 F (36.9 C)  TempSrc: Oral  SpO2: 99%  Weight: 212 lb 3.2 oz (96.3 kg)     Physical Exam  Constitutional: She is oriented to person, place, and time. She appears well-developed and well-nourished. No distress.  HENT:  Head: Normocephalic and atraumatic.  Nose: Nose normal.  Mouth/Throat: Oropharynx is clear and moist.  Eyes: Pupils are equal, round, and reactive to light. Conjunctivae and EOM are normal. No scleral icterus.  Neck: Neck supple. No thyromegaly present.  Cardiovascular: Normal rate, regular rhythm, normal heart sounds and intact distal pulses.  No murmur heard. Pulmonary/Chest: Effort normal and breath sounds normal. No respiratory distress. She  has no wheezes. She has no rales.  Musculoskeletal: She exhibits no edema or deformity.  Lymphadenopathy:    She has no cervical adenopathy.  Neurological: She is alert and oriented to person, place, and time.  Skin: Skin is warm and dry. Capillary refill takes less than 2 seconds. No rash noted.  Psychiatric: She has a normal mood and affect. Her behavior is normal.  Vitals reviewed.   Diabetic Foot Exam - Simple   Simple Foot Form Diabetic Foot exam was performed with the following findings:  Yes 05/21/2018  8:46 AM  Visual Inspection No deformities, no ulcerations, no other skin breakdown bilaterally:  Yes Sensation Testing  Intact to touch and monofilament testing bilaterally:  Yes Pulse Check Posterior Tibialis and Dorsalis pulse intact bilaterally:  Yes Comments        Assessment & Plan:   Problem List Items Addressed This Visit      Cardiovascular and Mediastinum   Hypertension    Well-controlled with some symptoms of hypotension We will hold lisinopril Recheck CMP Follow-up in 6 months Patient will notify me if home blood pressure is running high after stopping lisinopril      Relevant Orders   Comprehensive metabolic panel     Endocrine   T2DM (type 2 diabetes mellitus) ([place]) - Primary    Well-controlled with decreasing A1c Continue metformin 500 mg twice daily Discussed diet and exercise Foot exam completed today Patient to schedule eye exam Up-to-date on vaccinations Currently on ACE inhibitor, but we will be holding this If she continues to not need ACE inhibitor, we may need to start doing annual microalbumin again Follow-up in 6 months      Relevant Orders   POCT HgB A1C (Completed)     Other   Depression    Control has improved significantly She is seeing psychiatry She is going to be starting Abilify She is also doing therapy      Anxiety    Fairly well controlled Continue Prozac and BuSpar Followed by psychiatry and psychology       Obesity    Congratulated patient on her weight loss Discussed [place] diet Continue walking for exercise      HLD (hyperlipidemia)    Tolerating Lipitor well Recheck fasting lipid panel and CMP      Relevant Orders   Lipid panel   Comprehensive metabolic panel       Return in about 6 months (around 11/19/2018) for CPE.   The entirety of the information documented in the History of Present Illness, Review of Systems and Physical Exam were personally obtained by me. Portions of this information were initially documented by Tiburcio Pea, CMA and reviewed by me for thoroughness and accuracy.    Virginia Crews, MD, MPH [facility] 05/21/2018 8:50 AM

## 2018-05-21 NOTE — Patient Instructions (Addendum)
Hold lisinopril Goal BP <130/90  Get eye exam

## 2018-05-21 NOTE — Assessment & Plan Note (Signed)
Tolerating Lipitor well Recheck fasting lipid panel and CMP

## 2018-05-22 DIAGNOSIS — E782 Mixed hyperlipidemia: Secondary | ICD-10-CM | POA: Diagnosis not present

## 2018-05-22 DIAGNOSIS — I10 Essential (primary) hypertension: Secondary | ICD-10-CM | POA: Diagnosis not present

## 2018-05-23 ENCOUNTER — Telehealth: Payer: Self-pay

## 2018-05-23 LAB — COMPREHENSIVE METABOLIC PANEL
ALT: 21 IU/L (ref 0–32)
AST: 17 IU/L (ref 0–40)
Albumin/Globulin Ratio: 1.5 (ref 1.2–2.2)
Albumin: 4.3 g/dL (ref 3.5–5.5)
Alkaline Phosphatase: 78 IU/L (ref 39–117)
BUN/Creatinine Ratio: 15 (ref 9–23)
BUN: 11 mg/dL (ref 6–24)
Bilirubin Total: 0.4 mg/dL (ref 0.0–1.2)
CO2: 22 mmol/L (ref 20–29)
Calcium: 9.5 mg/dL (ref 8.7–10.2)
Chloride: 100 mmol/L (ref 96–106)
Creatinine, Ser: 0.74 mg/dL (ref 0.57–1.00)
GFR calc Af Amer: 104 mL/min/{1.73_m2} (ref >59)
GFR calc non Af Amer: 90 mL/min/{1.73_m2} (ref >59)
Globulin, Total: 2.9 g/dL (ref 1.5–4.5)
Glucose: 98 mg/dL (ref 65–99)
Potassium: 4.4 mmol/L (ref 3.5–5.2)
Sodium: 139 mmol/L (ref 134–144)
Total Protein: 7.2 g/dL (ref 6.0–8.5)

## 2018-05-23 LAB — LIPID PANEL
Chol/HDL Ratio: 2.5 ratio (ref 0.0–4.4)
Cholesterol, Total: 106 mg/dL (ref 100–199)
HDL: 42 mg/dL (ref >39)
LDL Calculated: 49 mg/dL (ref 0–99)
Triglycerides: 77 mg/dL (ref 0–149)
VLDL Cholesterol Cal: 15 mg/dL (ref 5–40)

## 2018-05-23 NOTE — Telephone Encounter (Signed)
Left patient a message advising that labs were normal and have been released to My Chart and if she has any further questions to call back.

## 2018-05-23 NOTE — Telephone Encounter (Signed)
Pt has a question on 1 area of her recent labs.  Please call pt back to discuss.  Thanks, [employer]

## 2018-05-23 NOTE — Telephone Encounter (Signed)
Spoke with patient. She wanted to know if HDL needed to be 39 or >39. Patient advised >39.

## 2018-05-23 NOTE — Telephone Encounter (Signed)
-----   Message from Virginia Crews, MD sent at 05/23/2018  8:20 AM EST ----- Normal labs. Cholesterol looks great

## 2018-06-03 ENCOUNTER — Other Ambulatory Visit: Payer: Self-pay

## 2018-06-03 ENCOUNTER — Encounter: Payer: Self-pay | Admitting: Psychiatry

## 2018-06-03 ENCOUNTER — Ambulatory Visit (INDEPENDENT_AMBULATORY_CARE_PROVIDER_SITE_OTHER): Payer: BLUE CROSS/BLUE SHIELD | Admitting: Psychiatry

## 2018-06-03 VITALS — BP 132/82 | HR 70 | Temp 98.2°F | Wt 210.2 lb

## 2018-06-03 DIAGNOSIS — G4700 Insomnia, unspecified: Secondary | ICD-10-CM | POA: Diagnosis not present

## 2018-06-03 DIAGNOSIS — F330 Major depressive disorder, recurrent, mild: Secondary | ICD-10-CM | POA: Diagnosis not present

## 2018-06-03 DIAGNOSIS — F411 Generalized anxiety disorder: Secondary | ICD-10-CM

## 2018-06-03 MED ORDER — ARIPIPRAZOLE 2 MG PO TABS: 2.0000 mg | ORAL_TABLET | Freq: Every day | ORAL | 0 refills | Status: DC

## 2018-06-03 NOTE — Progress Notes (Signed)
[facility] MD OP Progress Note  06/03/2018 8:57 AM Angelica Medina  MRN:  [id number]  Chief Complaint: ' I am here for follow up." Chief Complaint    Follow-up; Medication Refill     HPI: Angelica Medina is a 57 year old Caucasian female, unemployed, lives in [place], has a history of depression, anxiety, presented to the clinic today for a follow-up visit.  Patient today reports she is taking the Abilify as prescribed.  She has noticed improvement in her depressive symptoms.  She feels more motivated than before.  She reports her energy level as better.  She denies any side effects to the Abilify.  Aims scale today equals 0.  Patient reports she continues to struggle with some anxiety symptoms on and off however is coping better than before.  She reports sleep as improved on the trazodone.  She reports she had a good Thanksgiving holiday with her family.  She spent her Thanksgiving in [place].  Patient denies any suicidality.  Patient denies any perceptual disturbances.  Patient is motivated to continue psychotherapy with our therapist here in clinic. Visit Diagnosis:    ICD-10-CM   1. GAD (generalized anxiety disorder) F41.1 ARIPiprazole (ABILIFY) 2 MG tablet   improving  2. MDD (major depressive disorder), recurrent episode, mild (HCC) F33.0 ARIPiprazole (ABILIFY) 2 MG tablet   improving  3. Insomnia, unspecified type G47.00    improved    Past Psychiatric History: Have reviewed past psychiatric history from my progress note on 10/21/2017.  Past trials of Lexapro, Cymbalta, Paxil, Wellbutrin, Prozac  Past Medical History:  Past Medical History:  Diagnosis Date  . Anxiety   . Depression   . Hypertension 2016  . Sleep apnea   . T2DM (type 2 diabetes mellitus) ([place]) 2016   diet controlled    Past Surgical History:  Procedure Laterality Date  . [place]   for herniated disc, no hardware  . BREAST BIOPSY Left 2009   benign  . ECTOPIC PREGNANCY SURGERY    . ESOPHAGEAL  DILATION  2014   has had several i nthe past for achalasia  . OPEN REDUCTION INTERNAL FIXATION (ORIF) DISTAL RADIAL FRACTURE Left 09/17/2017   Procedure: OPEN REDUCTION INTERNAL FIXATION (ORIF) DISTAL RADIAL FRACTURE;  Surgeon: Lovell Sheehan, MD;  Location: [facility] ORS;  Service: Orthopedics;  Laterality: Left;    Family Psychiatric History: Reviewed family psychiatric history from my progress note on 10/21/2017  Family History:  Family History  Problem Relation Age of Onset  . Thyroid cancer Mother   . Anxiety disorder Mother   . Depression Mother   . Depression Father   . Diabetes Father   . COPD Father   . Anxiety disorder Father   . Hypothyroidism Sister   . Anxiety disorder Sister   . Healthy Brother   . Anxiety disorder Brother   . Anxiety disorder Brother   . Anxiety disorder Sister   . Depression Sister   . Drug abuse Paternal Aunt   . Alcohol abuse Maternal Grandfather   . Alcohol abuse Paternal Grandfather   . Alcohol abuse Paternal Grandmother   . Drug abuse Other     Social History: Have reviewed social history from my progress note on 10/21/2017 . Social History   Socioeconomic History  . Marital status: Married    Spouse name: [name]  . Number of children: 3  . Years of education: 14  . Highest education level: High school graduate  Occupational History  . 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 Financial resource strain: Not hard at all  . Food insecurity:    Worry: Never true    Inability: Never true  . Transportation needs:    Medical: No    Non-medical: No  Tobacco Use  . Smoking status: Former Smoker    Packs/day: 0.50    Years: 20.00    Pack years: 10.00    Last attempt to quit: 07/01/1994    Years since quitting: 23.9  . Smokeless tobacco: Never Used  Substance and Sexual Activity  . Alcohol use: No  . Drug use: No  . Sexual activity: Not Currently  Lifestyle  . Physical activity:    Days per week: 0 days    Minutes per  session: 0 min  . Stress: Rather much  Relationships  . Social connections:    Talks on phone: Three times a week    Gets together: More than three times a week    Attends religious service: More than 4 times per year    Active member of club or organization: No    Attends meetings of clubs or organizations: Never    Relationship status: Married  Other Topics Concern  . Not on file  Social History Narrative  . Not on file    Allergies:  Allergies  Allergen Reactions  . Oxycodone Itching  . Wellbutrin [Bupropion] Anxiety    Metabolic Disorder Labs: Lab Results  Component Value Date   HGBA1C 6.2 (A) 05/21/2018   No results found for: PROLACTIN Lab Results  Component Value Date   CHOL 106 05/22/2018   TRIG 77 05/22/2018   HDL 42 05/22/2018   CHOLHDL 2.5 05/22/2018   LDLCALC 49 05/22/2018   LDLCALC 58 07/26/2017   Lab Results  Component Value Date   TSH 1.700 11/18/2017   TSH 1.420 02/28/2017    Therapeutic Level Labs: No results found for: LITHIUM No results found for: VALPROATE No components found for:  CBMZ  Current Medications: Current Outpatient Medications  Medication Sig Dispense Refill  . ARIPiprazole (ABILIFY) 2 MG tablet Take 1 tablet (2 mg total) by mouth daily. 90 tablet 0  . atorvastatin (LIPITOR) 20 MG tablet TAKE ONE TABLET BY MOUTH DAILY 30 tablet 5  . b complex vitamins capsule Take 1 capsule by mouth daily.    . busPIRone (BUSPAR) 15 MG tablet Take 1 tablet (15 mg total) by mouth 3 (three) times daily. 270 tablet 0  . chlorhexidine (HIBICLENS) 4 % external liquid Apply topically daily as needed. 120 mL 1  . FLUoxetine (PROZAC) 40 MG capsule Take 1 capsule (40 mg total) by mouth daily. 90 capsule 0  . meloxicam (MOBIC) 15 MG tablet Take 1 tablet (15 mg total) by mouth daily. 30 tablet 2  . metFORMIN (GLUCOPHAGE) 500 MG tablet TAKE ONE TABLET BY MOUTH TWICE A DAY WITH A MEAL 180 tablet 1  . Multiple Vitamin (MULTIVITAMIN) tablet Take 1 tablet by  mouth daily.    . Omega-3 1000 MG CAPS Take 1 tablet by mouth daily.    . traZODone (DESYREL) 50 MG tablet Take 1.5-2 tablets (75-100 mg total) by mouth at bedtime as needed for sleep. 180 tablet 0   No current facility-administered medications for this visit.      Musculoskeletal: Strength & Muscle Tone: within normal limits Gait & Station: normal Patient leans: N/A  Psychiatric Specialty Exam: Review of Systems  Psychiatric/Behavioral: The patient is nervous/anxious (improving).   All other systems reviewed and are negative.  Blood pressure 132/82, pulse 70, temperature 98.2 F (36.8 C), temperature source Oral, weight 210 lb 3.2 oz (95.3 kg).Body mass index is 32.44 kg/m.  General Appearance: Casual  Eye Contact:  Fair  Speech:  Clear and Coherent  Volume:  Normal  Mood:  Anxious improving  Affect:  Congruent  Thought Process:  Goal Directed and Descriptions of Associations: Intact  Orientation:  Full (Time, Place, and Person)  Thought Content: Logical   Suicidal Thoughts:  No  Homicidal Thoughts:  No  Memory:  Immediate;   Fair Recent;   Fair Remote;   Fair  Judgement:  Fair  Insight:  Fair  Psychomotor Activity:  Normal  Concentration:  Concentration: Fair and Attention Span: Fair  Recall:  [employer] of Knowledge: Fair  Language: Fair  Akathisia:  No  Handed:  Right  AIMS (if indicated): 0  Assets:  Communication Skills Desire for Improvement Social Support  ADL's:  Intact  Cognition: WNL  Sleep:  Fair   Screenings: GAD-7     Office Visit from 05/21/2018 in [facility] Office Visit from 09/11/2017 in [place] Visit from 07/26/2017 in [place] Visit from 06/28/2017 in [place] Visit from 03/29/2017 in [place]  Total GAD-7 Score  8  7  11  10  8     PHQ2-9     Office Visit from 05/21/2018 in [place] Visit from 09/11/2017 in  [place] Visit from 07/26/2017 in [place] Visit from 06/28/2017 in [place] Visit from 03/29/2017 in [place]  PHQ-2 Total Score  3  6  6   0  1  PHQ-9 Total Score  6  17  17  10  5        Assessment and Plan: Lyndi is a 57 year old Caucasian female, retired, lives in [place], has a history of depression, OSA, diabetes mellitus, hyperlipidemia, presented to the clinic today for a follow-up visit.  Patient is currently making progress on the current medication regimen.  She will also pursue psychotherapy on a regular basis.  Plan MDD Continue Prozac 40 mg p.o. daily.  She tried 60 mg in the past and it gave her side effects.  BuSpar 15 mg p.o. 3 times daily Abilify 2 mg p.o. daily.   Aims equals 0 PHQ 9 equals 12  For insomnia Trazodone 100 mg p.o. nightly as needed OSA compliant with CPAP  GAD BuSpar as noted above. Prozac at 40 mg p.o. daily Continue CBT. GAD 7 equals 12  Follow-up in clinic in 1 month or sooner if needed.  More than 50 % of the time was spent for psychoeducation and supportive psychotherapy and care coordination.  This note was generated in part or whole with voice recognition software. Voice recognition is usually quite accurate but there are transcription errors that can and very often do occur. I apologize for any typographical errors that were not detected and corrected.          Ursula Alert, MD 06/03/2018, 8:57 AM

## 2018-06-04 DIAGNOSIS — M9905 Segmental and somatic dysfunction of pelvic region: Secondary | ICD-10-CM | POA: Diagnosis not present

## 2018-06-04 DIAGNOSIS — M5136 Other intervertebral disc degeneration, lumbar region: Secondary | ICD-10-CM | POA: Diagnosis not present

## 2018-06-04 DIAGNOSIS — M5431 Sciatica, right side: Secondary | ICD-10-CM | POA: Diagnosis not present

## 2018-06-04 DIAGNOSIS — M9903 Segmental and somatic dysfunction of lumbar region: Secondary | ICD-10-CM | POA: Diagnosis not present

## 2018-06-05 ENCOUNTER — Ambulatory Visit: Payer: BLUE CROSS/BLUE SHIELD | Admitting: Licensed Clinical Social Worker

## 2018-06-09 ENCOUNTER — Other Ambulatory Visit: Payer: Self-pay

## 2018-06-09 ENCOUNTER — Ambulatory Visit: Payer: BLUE CROSS/BLUE SHIELD

## 2018-06-09 VITALS — BP 124/83 | Ht 67.5 in | Wt 210.0 lb

## 2018-06-09 DIAGNOSIS — F330 Major depressive disorder, recurrent, mild: Secondary | ICD-10-CM | POA: Diagnosis not present

## 2018-06-09 DIAGNOSIS — F331 Major depressive disorder, recurrent, moderate: Secondary | ICD-10-CM

## 2018-06-09 NOTE — Progress Notes (Signed)
geneseight testing . No charge, swab only.

## 2018-06-11 DIAGNOSIS — M9905 Segmental and somatic dysfunction of pelvic region: Secondary | ICD-10-CM | POA: Diagnosis not present

## 2018-06-11 DIAGNOSIS — M9903 Segmental and somatic dysfunction of lumbar region: Secondary | ICD-10-CM | POA: Diagnosis not present

## 2018-06-11 DIAGNOSIS — M5431 Sciatica, right side: Secondary | ICD-10-CM | POA: Diagnosis not present

## 2018-06-11 DIAGNOSIS — M5136 Other intervertebral disc degeneration, lumbar region: Secondary | ICD-10-CM | POA: Diagnosis not present

## 2018-06-17 ENCOUNTER — Telehealth: Payer: Self-pay

## 2018-06-17 NOTE — Telephone Encounter (Signed)
pt called left message that she wanted to get the result of the genesight testing.

## 2018-06-17 NOTE — Telephone Encounter (Signed)
Please give her a copy

## 2018-06-18 DIAGNOSIS — M5431 Sciatica, right side: Secondary | ICD-10-CM | POA: Diagnosis not present

## 2018-06-18 DIAGNOSIS — M9903 Segmental and somatic dysfunction of lumbar region: Secondary | ICD-10-CM | POA: Diagnosis not present

## 2018-06-18 DIAGNOSIS — M9905 Segmental and somatic dysfunction of pelvic region: Secondary | ICD-10-CM | POA: Diagnosis not present

## 2018-06-18 DIAGNOSIS — M5136 Other intervertebral disc degeneration, lumbar region: Secondary | ICD-10-CM | POA: Diagnosis not present

## 2018-06-23 NOTE — Telephone Encounter (Signed)
pt called states she wanted to find out about her genesight testing results.   Copy in your box

## 2018-06-23 NOTE — Telephone Encounter (Signed)
Please give her a copy of result. Please advise that it will be discussed during next visit. thanks

## 2018-06-24 DIAGNOSIS — M9903 Segmental and somatic dysfunction of lumbar region: Secondary | ICD-10-CM | POA: Diagnosis not present

## 2018-06-24 DIAGNOSIS — M9905 Segmental and somatic dysfunction of pelvic region: Secondary | ICD-10-CM | POA: Diagnosis not present

## 2018-06-24 DIAGNOSIS — M5431 Sciatica, right side: Secondary | ICD-10-CM | POA: Diagnosis not present

## 2018-06-24 DIAGNOSIS — M5136 Other intervertebral disc degeneration, lumbar region: Secondary | ICD-10-CM | POA: Diagnosis not present

## 2018-07-01 DIAGNOSIS — M5136 Other intervertebral disc degeneration, lumbar region: Secondary | ICD-10-CM | POA: Diagnosis not present

## 2018-07-01 DIAGNOSIS — M9905 Segmental and somatic dysfunction of pelvic region: Secondary | ICD-10-CM | POA: Diagnosis not present

## 2018-07-01 DIAGNOSIS — M5431 Sciatica, right side: Secondary | ICD-10-CM | POA: Diagnosis not present

## 2018-07-01 DIAGNOSIS — M9903 Segmental and somatic dysfunction of lumbar region: Secondary | ICD-10-CM | POA: Diagnosis not present

## 2018-07-07 ENCOUNTER — Encounter: Payer: Self-pay | Admitting: Psychiatry

## 2018-07-07 ENCOUNTER — Other Ambulatory Visit: Payer: Self-pay

## 2018-07-07 ENCOUNTER — Ambulatory Visit (INDEPENDENT_AMBULATORY_CARE_PROVIDER_SITE_OTHER): Payer: BLUE CROSS/BLUE SHIELD | Admitting: Psychiatry

## 2018-07-07 VITALS — BP 124/83 | HR 64 | Temp 97.4°F

## 2018-07-07 DIAGNOSIS — F331 Major depressive disorder, recurrent, moderate: Secondary | ICD-10-CM | POA: Diagnosis not present

## 2018-07-07 DIAGNOSIS — F411 Generalized anxiety disorder: Secondary | ICD-10-CM | POA: Diagnosis not present

## 2018-07-07 DIAGNOSIS — E538 Deficiency of other specified B group vitamins: Secondary | ICD-10-CM

## 2018-07-07 DIAGNOSIS — F5105 Insomnia due to other mental disorder: Secondary | ICD-10-CM

## 2018-07-07 MED ORDER — CITALOPRAM HYDROBROMIDE 10 MG PO TABS: 10.0000 mg | ORAL_TABLET | Freq: Every day | ORAL | 1 refills | Status: DC

## 2018-07-07 MED ORDER — TRAZODONE HCL 50 MG PO TABS: 125.0000 mg | ORAL_TABLET | Freq: Every evening | ORAL | 1 refills | Status: DC | PRN

## 2018-07-07 NOTE — Patient Instructions (Signed)
Citalopram tablets What is this medicine? CITALOPRAM (sye TAL oh pram) is a medicine for depression. This medicine may be used for other purposes; ask your health care provider or pharmacist if you have questions. COMMON BRAND NAME(S): Celexa What should I tell my health care provider before I take this medicine? They need to know if you have any of these conditions: -bleeding disorders -bipolar disorder or a family history of bipolar disorder -glaucoma -heart disease -history of irregular heartbeat -kidney disease -liver disease -low levels of magnesium or potassium in the blood -receiving electroconvulsive therapy -seizures -suicidal thoughts, plans, or attempt; a previous suicide attempt by you or a family member -take medicines that treat or prevent blood clots -thyroid disease -an unusual or allergic reaction to citalopram, escitalopram, other medicines, foods, dyes, or preservatives -pregnant or trying to become pregnant -breast-feeding How should I use this medicine? Take this medicine by mouth with a glass of water. Follow the directions on the prescription label. You can take it with or without food. Take your medicine at regular intervals. Do not take your medicine more often than directed. Do not stop taking this medicine suddenly except upon the advice of your doctor. Stopping this medicine too quickly may cause serious side effects or your condition may worsen. A special MedGuide will be given to you by the pharmacist with each prescription and refill. Be sure to read this information carefully each time. Talk to your pediatrician regarding the use of this medicine in children. Special care may be needed. Patients over 60 years old may have a stronger reaction and need a smaller dose. Overdosage: If you think you have taken too much of this medicine contact a poison control center or emergency room at once. NOTE: This medicine is only for you. Do not share this medicine with  others. What if I miss a dose? If you miss a dose, take it as soon as you can. If it is almost time for your next dose, take only that dose. Do not take double or extra doses. What may interact with this medicine? Do not take this medicine with any of the following medications: -certain medicines for fungal infections like fluconazole, itraconazole, ketoconazole, posaconazole, voriconazole -cisapride -dofetilide -dronedarone -escitalopram -linezolid -MAOIs like Carbex, Eldepryl, Marplan, Nardil, and Parnate -methylene blue (injected into a vein) -pimozide -thioridazine -ziprasidone This medicine may also interact with the following medications: -alcohol -amphetamines -aspirin and aspirin-like medicines -carbamazepine -certain medicines for depression, anxiety, or psychotic disturbances -certain medicines for infections like chloroquine, clarithromycin, erythromycin, furazolidone, isoniazid, pentamidine -certain medicines for migraine headaches like almotriptan, eletriptan, frovatriptan, naratriptan, rizatriptan, sumatriptan, zolmitriptan -certain medicines for sleep -certain medicines that treat or prevent blood clots like dalteparin, enoxaparin, warfarin -cimetidine -diuretics -fentanyl -lithium -methadone -metoprolol -NSAIDs, medicines for pain and inflammation, like ibuprofen or naproxen -omeprazole -other medicines that prolong the QT interval (cause an abnormal heart rhythm) -procarbazine -rasagiline -supplements like St. John's wort, kava kava, valerian -tramadol -tryptophan This list may not describe all possible interactions. Give your health care provider a list of all the medicines, herbs, non-prescription drugs, or dietary supplements you use. Also tell them if you smoke, drink alcohol, or use illegal drugs. Some items may interact with your medicine. What should I watch for while using this medicine? Tell your doctor if your symptoms do not get better or if they  get worse. Visit your doctor or health care professional for regular checks on your progress. Because it may take several weeks to see the full   effects of this medicine, it is important to continue your treatment as prescribed by your doctor. Patients and their families should watch out for new or worsening thoughts of suicide or depression. Also watch out for sudden changes in feelings such as feeling anxious, agitated, panicky, irritable, hostile, aggressive, impulsive, severely restless, overly excited and hyperactive, or not being able to sleep. If this happens, especially at the beginning of treatment or after a change in dose, call your health care professional. You may get drowsy or dizzy. Do not drive, use machinery, or do anything that needs mental alertness until you know how this medicine affects you. Do not stand or sit up quickly, especially if you are an older patient. This reduces the risk of dizzy or fainting spells. Alcohol may interfere with the effect of this medicine. Avoid alcoholic drinks. Your mouth may get dry. Chewing sugarless gum or sucking hard candy, and drinking plenty of water will help. Contact your doctor if the problem does not go away or is severe. What side effects may I notice from receiving this medicine? Side effects that you should report to your doctor or health care professional as soon as possible: -allergic reactions like skin rash, itching or hives, swelling of the face, lips, or tongue -anxious -black, tarry stools -breathing problems -changes in vision -chest pain -confusion -elevated mood, decreased need for sleep, racing thoughts, impulsive behavior -eye pain -fast, irregular heartbeat -feeling faint or lightheaded, falls -feeling agitated, angry, or irritable -hallucination, loss of contact with reality -loss of balance or coordination -loss of memory -painful or prolonged erections -restlessness, pacing, inability to keep  still -seizures -stiff muscles -suicidal thoughts or other mood changes -trouble sleeping -unusual bleeding or bruising -unusually weak or tired -vomiting Side effects that usually do not require medical attention (report to your doctor or health care professional if they continue or are bothersome): -change in appetite or weight -change in sex drive or performance -dizziness -headache -increased sweating -indigestion, nausea -tremors This list may not describe all possible side effects. Call your doctor for medical advice about side effects. You may report side effects to FDA at 1-800-FDA-1088. Where should I keep my medicine? Keep out of reach of children. Store at room temperature between 15 and 30 degrees C (59 and 86 degrees F). Throw away any unused medicine after the expiration date. NOTE: This sheet is a summary. It may not cover all possible information. If you have questions about this medicine, talk to your doctor, pharmacist, or health care provider.  2019 Elsevier/Gold Standard (2015-11-21 13:18:52)  

## 2018-07-07 NOTE — Progress Notes (Signed)
[facility] MD OP Progress Note  07/07/2018 9:35 AM Anushree Dorsi  MRN:  [id number]  Chief Complaint: ' I am here for follow up." Chief Complaint    Follow-up     HPI: Kasidy is a 58 year old Caucasian female, unemployed, lives in [place], has a history of depression, anxiety, presented to the clinic today for a follow-up visit.  Patient today reports she continues to feel depressed.  She describes her depressive symptoms as sadness, low energy, lack of motivation and so on.  She reports the Abilify was helping initially.  Patient however reports she is currently at a place where she needs more met readjustments.  Patient has been on the Prozac 40 mg for a long time.  She cannot increase to a higher dosage since she has tried that previously with side effects.  Patient had GeneSight testing done.  Hence reviewed and discussed the GeneSight testing results with patient today.  Based on GeneSight testing review, discussed starting Celexa.  Patient agrees with plan.  Patient continues to struggle with sleep.  She is currently on trazodone 100 mg.  Discussed increasing the dosage of trazodone.  Also discussed adding folate supplement since she has folate conversion reduction based on GeneSight testing.  She agrees with plan.  Patient currently denies any suicidality.  She denies any homicidality.  She denies any perceptual disturbances.  Visit Diagnosis:    ICD-10-CM   1. MDD (major depressive disorder), recurrent episode, moderate (HCC) F33.1 traZODone (DESYREL) 50 MG tablet  2. GAD (generalized anxiety disorder) F41.1   3. Insomnia due to mental condition F51.05   4. Folic acid deficiency D14.9    folate conversion reduction    Past Psychiatric History: I have reviewed past psychiatric history from my progress note on 10/21/2017.  Past trials of Lexapro, Cymbalta, Paxil, Wellbutrin, Prozac  Past Medical History:  Past Medical History:  Diagnosis Date  . Anxiety   . Depression   .  Hypertension 2016  . Sleep apnea   . T2DM (type 2 diabetes mellitus) ([place]) 2016   diet controlled    Past Surgical History:  Procedure Laterality Date  . [place]   for herniated disc, no hardware  . BREAST BIOPSY Left 2009   benign  . ECTOPIC PREGNANCY SURGERY    . ESOPHAGEAL DILATION  2014   has had several i nthe past for achalasia  . OPEN REDUCTION INTERNAL FIXATION (ORIF) DISTAL RADIAL FRACTURE Left 09/17/2017   Procedure: OPEN REDUCTION INTERNAL FIXATION (ORIF) DISTAL RADIAL FRACTURE;  Surgeon: Lovell Sheehan, MD;  Location: [facility] ORS;  Service: Orthopedics;  Laterality: Left;    Family Psychiatric History: Reviewed family psychiatric history from my progress note on 10/21/2017  Family History:  Family History  Problem Relation Age of Onset  . Thyroid cancer Mother   . Anxiety disorder Mother   . Depression Mother   . Depression Father   . Diabetes Father   . COPD Father   . Anxiety disorder Father   . Hypothyroidism Sister   . Anxiety disorder Sister   . Healthy Brother   . Anxiety disorder Brother   . Anxiety disorder Brother   . Anxiety disorder Sister   . Depression Sister   . Drug abuse Paternal Aunt   . Alcohol abuse Maternal Grandfather   . Alcohol abuse Paternal Grandfather   . Alcohol abuse Paternal Grandmother   . Drug abuse Other     Social History: I have reviewed social history from my progress  note on 10/21/2017. Social History   Socioeconomic History  . Marital status: Married    Spouse name: [name]  . Number of children: 3  . Years of education: 49  . Highest education level: High school graduate  Occupational History  . 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 Financial resource strain: Not hard at all  . Food insecurity:    Worry: Never true    Inability: Never true  . Transportation needs:    Medical: No    Non-medical: No  Tobacco Use  . Smoking status: Former Smoker    Packs/day: 0.50    Years: 20.00     Pack years: 10.00    Last attempt to quit: 07/01/1994    Years since quitting: 24.0  . Smokeless tobacco: Never Used  Substance and Sexual Activity  . Alcohol use: No  . Drug use: No  . Sexual activity: Not Currently  Lifestyle  . Physical activity:    Days per week: 0 days    Minutes per session: 0 min  . Stress: Rather much  Relationships  . Social connections:    Talks on phone: Three times a week    Gets together: More than three times a week    Attends religious service: More than 4 times per year    Active member of club or organization: No    Attends meetings of clubs or organizations: Never    Relationship status: Married  Other Topics Concern  . Not on file  Social History Narrative  . Not on file    Allergies:  Allergies  Allergen Reactions  . Oxycodone Itching  . Wellbutrin [Bupropion] Anxiety    Metabolic Disorder Labs: Lab Results  Component Value Date   HGBA1C 6.2 (A) 05/21/2018   No results found for: PROLACTIN Lab Results  Component Value Date   CHOL 106 05/22/2018   TRIG 77 05/22/2018   HDL 42 05/22/2018   CHOLHDL 2.5 05/22/2018   LDLCALC 49 05/22/2018   LDLCALC 58 07/26/2017   Lab Results  Component Value Date   TSH 1.700 11/18/2017   TSH 1.420 02/28/2017    Therapeutic Level Labs: No results found for: LITHIUM No results found for: VALPROATE No components found for:  CBMZ  Current Medications: Current Outpatient Medications  Medication Sig Dispense Refill  . ARIPiprazole (ABILIFY) 2 MG tablet Take 1 tablet (2 mg total) by mouth daily. 90 tablet 0  . atorvastatin (LIPITOR) 20 MG tablet TAKE ONE TABLET BY MOUTH DAILY 30 tablet 5  . b complex vitamins capsule Take 1 capsule by mouth daily.    . busPIRone (BUSPAR) 15 MG tablet Take 1 tablet (15 mg total) by mouth 3 (three) times daily. 270 tablet 0  . chlorhexidine (HIBICLENS) 4 % external liquid Apply topically daily as needed. 120 mL 1  . meloxicam (MOBIC) 15 MG tablet Take 1 tablet  (15 mg total) by mouth daily. 30 tablet 2  . metFORMIN (GLUCOPHAGE) 500 MG tablet TAKE ONE TABLET BY MOUTH TWICE A DAY WITH A MEAL 180 tablet 1  . Multiple Vitamin (MULTIVITAMIN) tablet Take 1 tablet by mouth daily.    . Omega-3 1000 MG CAPS Take 1 tablet by mouth daily.    . traZODone (DESYREL) 50 MG tablet Take 2.5-3 tablets (125-150 mg total) by mouth at bedtime as needed for sleep. 90 tablet 1  . citalopram (CELEXA) 10 MG tablet Take 1 tablet (10 mg total) by mouth daily. Start with 5 mg  and increase to 10 mg in 1 week. 30 tablet 1   No current facility-administered medications for this visit.      Musculoskeletal: Strength & Muscle Tone: within normal limits Gait & Station: normal Patient leans: N/A  Psychiatric Specialty Exam: Review of Systems  Psychiatric/Behavioral: Positive for depression. The patient is nervous/anxious and has insomnia.   All other systems reviewed and are negative.   Blood pressure 124/83, pulse 64, temperature (!) 97.4 F (36.3 C), temperature source Oral.There is no height or weight on file to calculate BMI.  General Appearance: Casual  Eye Contact:  Fair  Speech:  Clear and Coherent  Volume:  Normal  Mood:  Dysphoric  Affect:  Congruent  Thought Process:  Goal Directed and Descriptions of Associations: Intact  Orientation:  Full (Time, Place, and Person)  Thought Content: Logical   Suicidal Thoughts:  No  Homicidal Thoughts:  No  Memory:  Immediate;   Fair Recent;   Fair Remote;   Fair  Judgement:  Fair  Insight:  Fair  Psychomotor Activity:  Normal  Concentration:  Concentration: Fair and Attention Span: Fair  Recall:  [employer] of Knowledge: Fair  Language: Fair  Akathisia:  No  Handed:  Right  AIMS (if indicated): denies tremors, rigidity,stiffness  Assets:  Communication Skills Desire for Improvement Social Support  ADL's:  Intact  Cognition: WNL  Sleep:  Poor   Screenings: GAD-7     Office Visit from 05/21/2018 in  [facility] Visit from 09/11/2017 in [facility] Visit from 07/26/2017 in [facility] Visit from 06/28/2017 in [facility] Visit from 03/29/2017 in [facility]  Total GAD-7 Score  _0 PHQ2-9     Office Visit from 05/21/2018 in [facility] Visit from 09/11/2017 in [facility] Visit from 07/26/2017 in [facility] Visit from 06/28/2017 in [facility] Visit from 03/29/2017 in [facility]  PHQ-2 Total Score  _1 0  1  PHQ-9 Total Score  _2 Assessment and Plan: Rosalene is a 58 year old Caucasian female, retired, lives in [place], has a history of depression, OSA, diabetes mellitus, hyperlipidemia, presented to the clinic today for a follow-up visit.  Patient reports continued depressive symptoms.  GeneSight testing results reviewed and discussed making the following medication changes.  She will continue psychotherapy.  Plan MDD Discontinue Prozac. Add Celexa 5 mg for a week and increase to 10 mg after a week. Continue BuSpar 15 mg p.o. 3 times daily Abilify 2 mg p.o. daily   For insomnia Increase trazodone to 125 to 150 mg p.o. nightly as needed OSA compliant with CPAP  GAD BuSpar as noted above Add Celexa as prescribed above. Continue CBT  For folate conversion reduction Add Methylfolate 7.5 mg p.o. daily.  Discussed GeneSight testing results which was reported.  Answered all her questions and her medications today were readjusted based on these report.  Follow-up in clinic in 3 to 4 weeks or sooner if needed.  I have spent atleast 25 minutes face to face with patient today . More than 50 % of the time was spent for psychoeducation and supportive psychotherapy and care coordination.  This note was generated in part or whole with voice  recognition software. Voice recognition is usually  quite accurate but there are transcription errors that can and very often do occur. I apologize for any typographical errors that were not detected and corrected.         Ursula Alert, MD 07/07/2018, 9:35 AM

## 2018-07-24 ENCOUNTER — Other Ambulatory Visit: Payer: Self-pay | Admitting: Family Medicine

## 2018-07-24 DIAGNOSIS — E7800 Pure hypercholesterolemia, unspecified: Secondary | ICD-10-CM

## 2018-08-04 ENCOUNTER — Telehealth: Payer: Self-pay

## 2018-08-04 ENCOUNTER — Telehealth: Payer: Self-pay | Admitting: Psychiatry

## 2018-08-04 MED ORDER — CITALOPRAM HYDROBROMIDE 20 MG PO TABS
20.0000 mg | ORAL_TABLET | Freq: Every day | ORAL | 0 refills | Status: DC
Start: 2018-08-04 — End: 2018-10-02

## 2018-08-04 NOTE — Telephone Encounter (Signed)
Called patient back. Discussed increasing medication to 20 mg.Pt reports she has trouble with co pay and wants to know what she can do with meds without coming in. She will see me back in 4 weeks.

## 2018-08-04 NOTE — Telephone Encounter (Signed)
spoke with patient , she stated that she needed to cancel her appt for tomorrow because she doesnt have the money to keep coming in.  but the issue is she still not feeling right. wanted to speak with dr Shea Evans about the new medication you started her on.

## 2018-08-04 NOTE — Telephone Encounter (Signed)
pt called left a message that she wanted to cancel her appt for tomorrow and  that she need to speak with dr. [name] about medication.

## 2018-08-05 ENCOUNTER — Ambulatory Visit: Payer: Self-pay | Admitting: Psychiatry

## 2018-08-06 ENCOUNTER — Other Ambulatory Visit: Payer: Self-pay | Admitting: Family Medicine

## 2018-08-06 DIAGNOSIS — Z1231 Encounter for screening mammogram for malignant neoplasm of breast: Secondary | ICD-10-CM

## 2018-08-20 DIAGNOSIS — E119 Type 2 diabetes mellitus without complications: Secondary | ICD-10-CM | POA: Diagnosis not present

## 2018-08-26 ENCOUNTER — Ambulatory Visit
Admission: RE | Admit: 2018-08-26 | Discharge: 2018-08-26 | Disposition: A | Discharge status: Home / Self Care | Payer: BLUE CROSS/BLUE SHIELD | Source: Ambulatory Visit | Attending: Family Medicine | Admitting: Family Medicine

## 2018-08-26 DIAGNOSIS — Z1231 Encounter for screening mammogram for malignant neoplasm of breast: Secondary | ICD-10-CM | POA: Diagnosis not present

## 2018-09-03 ENCOUNTER — Encounter: Payer: Self-pay | Admitting: Family Medicine

## 2018-09-03 ENCOUNTER — Ambulatory Visit (INDEPENDENT_AMBULATORY_CARE_PROVIDER_SITE_OTHER): Payer: BLUE CROSS/BLUE SHIELD | Admitting: Family Medicine

## 2018-09-03 VITALS — BP 123/76 | HR 80 | Temp 98.3°F | Wt 209.8 lb

## 2018-09-03 DIAGNOSIS — M25432 Effusion, left wrist: Secondary | ICD-10-CM | POA: Diagnosis not present

## 2018-09-03 DIAGNOSIS — D1722 Benign lipomatous neoplasm of skin and subcutaneous tissue of left arm: Secondary | ICD-10-CM

## 2018-09-03 DIAGNOSIS — S52502S Unspecified fracture of the lower end of left radius, sequela: Secondary | ICD-10-CM | POA: Diagnosis not present

## 2018-09-03 NOTE — Patient Instructions (Addendum)
Call Dr Harlow Mares to set f/u appt   Lipoma  A lipoma is a noncancerous (benign) tumor that is made up of fat cells. This is a very common type of soft-tissue growth. Lipomas are usually found under the skin (subcutaneous). They may occur in any tissue of the body that contains fat. Common areas for lipomas to appear include the back, shoulders, buttocks, and thighs.  Lipomas grow slowly, and they are usually painless. Most lipomas do not cause problems and do not require treatment. What are the causes? The cause of this condition is not known. What increases the risk? You are more likely to develop this condition if:  You are 62-62 years old.  You have a family history of lipomas. What are the signs or symptoms? A lipoma usually appears as a small, round bump under the skin. In most cases, the lump will:  Feel soft or rubbery.  Not cause pain or other symptoms. However, if a lipoma is located in an area where it pushes on nerves, it can become painful or cause other symptoms. How is this diagnosed? A lipoma can usually be diagnosed with a physical exam. You may also have tests to confirm the diagnosis and to rule out other conditions. Tests may include:  Imaging tests, such as a CT scan or MRI.  Removal of a tissue sample to be looked at under a microscope (biopsy). How is this treated? Treatment for this condition depends on the size of the lipoma and whether it is causing any symptoms.  For small lipomas that are not causing problems, no treatment is needed.  If a lipoma is bigger or it causes problems, surgery may be done to remove the lipoma. Lipomas can also be removed to improve appearance. Most often, the procedure is done after applying a medicine that numbs the area (local anesthetic). Follow these instructions at home:  Watch your lipoma for any changes.  Keep all follow-up visits as told by your health care provider. This is important. Contact a health care provider  if:  Your lipoma becomes larger or hard.  Your lipoma becomes painful, red, or increasingly swollen. These could be signs of infection or a more serious condition. Get help right away if:  You develop tingling or numbness in an area near the lipoma. This could indicate that the lipoma is causing nerve damage. Summary  A lipoma is a noncancerous tumor that is made up of fat cells.  Most lipomas do not cause problems and do not require treatment.  If a lipoma is bigger or it causes problems, surgery may be done to remove the lipoma. This information is not intended to replace advice given to you by your health care provider. Make sure you discuss any questions you have with your health care provider. Document Released: 06/08/2002 Document Revised: 06/04/2017 Document Reviewed: 06/04/2017 Elsevier Interactive Patient Education  2019 [employer].

## 2018-09-03 NOTE — Progress Notes (Signed)
Patient: Angelica Medina Female    DOB: 1960-08-24   58 y.o.   MRN: [id number] Visit Date: 09/03/2018  Today's Provider: Lavon Paganini, MD   Chief Complaint  Patient presents with  . Wrist Pain   Subjective:    I, Porsha McClurkin CMA, am acting as a scribe for Lavon Paganini, MD.   Wrist Pain   The pain is present in the left wrist and left arm. This is a recurrent problem. The current episode started in the past 7 days. The problem occurs constantly. The problem has been unchanged. The quality of the pain is described as dull. The pain is at a severity of 4/10. The pain is mild. Associated symptoms include joint swelling and stiffness. Associated symptoms comments: Swelling . She has tried nothing for the symptoms.  Patient fracture left wrist March,2019.  Pain and stiffnesso f L wrist x3 weeks.  Noticed swelling lowero n forearm ~1wk ago  Allergies  Allergen Reactions  . Oxycodone Itching  . Wellbutrin [Bupropion] Anxiety     Current Outpatient Medications:  .  ARIPiprazole (ABILIFY) 2 MG tablet, Take 1 tablet (2 mg total) by mouth daily., Disp: 90 tablet, Rfl: 0 .  atorvastatin (LIPITOR) 20 MG tablet, TAKE ONE TABLET BY MOUTH DAILY, Disp: 90 tablet, Rfl: 4 .  b complex vitamins capsule, Take 1 capsule by mouth daily., Disp: , Rfl:  .  busPIRone (BUSPAR) 15 MG tablet, Take 1 tablet (15 mg total) by mouth 3 (three) times daily., Disp: 270 tablet, Rfl: 0 .  chlorhexidine (HIBICLENS) 4 % external liquid, Apply topically daily as needed., Disp: 120 mL, Rfl: 1 .  citalopram (CELEXA) 20 MG tablet, Take 1 tablet (20 mg total) by mouth daily., Disp: 90 tablet, Rfl: 0 .  meloxicam (MOBIC) 15 MG tablet, Take 1 tablet (15 mg total) by mouth daily., Disp: 30 tablet, Rfl: 2 .  metFORMIN (GLUCOPHAGE) 500 MG tablet, TAKE ONE TABLET BY MOUTH TWICE A DAY WITH A MEAL, Disp: 180 tablet, Rfl: 1 .  Multiple Vitamin (MULTIVITAMIN) tablet, Take 1 tablet by mouth daily., Disp: , Rfl:  .   Omega-3 1000 MG CAPS, Take 1 tablet by mouth daily., Disp: , Rfl:  .  traZODone (DESYREL) 50 MG tablet, Take 2.5-3 tablets (125-150 mg total) by mouth at bedtime as needed for sleep., Disp: 90 tablet, Rfl: 1  Review of Systems  Constitutional: Negative.   Respiratory: Negative.   Cardiovascular: Negative.   Musculoskeletal: Positive for stiffness.    Social History   Tobacco Use  . Smoking status: Former Smoker    Packs/day: 0.50    Years: 20.00    Pack years: 10.00    Last attempt to quit: 07/01/1994    Years since quitting: 24.1  . Smokeless tobacco: Never Used  Substance Use Topics  . Alcohol use: No      Objective:   BP 123/76 (BP Location: Right Arm, Patient Position: Sitting, Cuff Size: Large)   Pulse 80   Temp 98.3 F (36.8 C) (Oral)   Wt 209 lb 12.8 oz (95.2 kg)   SpO2 98%   BMI 32.37 kg/m  Vitals:   09/03/18 1352  BP: 123/76  Pulse: 80  Temp: 98.3 F (36.8 C)  TempSrc: Oral  SpO2: 98%  Weight: 209 lb 12.8 oz (95.2 kg)     Physical Exam Vitals signs reviewed.  Constitutional:      General: She is not in acute distress.    Appearance: She is  well-developed.  HENT:     Head: Normocephalic and atraumatic.  Eyes:     General: No scleral icterus.    Conjunctiva/sclera: Conjunctivae normal.  Cardiovascular:     Rate and Rhythm: Normal rate and regular rhythm.     Pulses: Normal pulses.     Heart sounds: Normal heart sounds.  Pulmonary:     Effort: Pulmonary effort is normal. No respiratory distress.     Breath sounds: Normal breath sounds. No wheezing or rhonchi.  Musculoskeletal:     Right lower leg: No edema.     Left lower leg: No edema.     Comments: L wrist: Swelling noted around surgical incision on palmar side of wrist.  No bony tenderness.  Range of motion is intact.  2 cm mobile subcutaneous mass noted in the mid forearm  Skin:    General: Skin is warm and dry.     Capillary Refill: Capillary refill takes less than 2 seconds.      Findings: No rash.  Neurological:     Mental Status: She is alert and oriented to person, place, and time.     Sensory: No sensory deficit.     Motor: No weakness.  Psychiatric:        Mood and Affect: Mood normal.        Behavior: Behavior normal.        Assessment & Plan   1. Wrist swelling, left 2. Closed fracture of distal end of left radius, unspecified fracture morphology, sequela -Patient is status post left wrist open reduction and internal fixation on 09/17/2017 for close fracture of the distal end of left radius - She was previously followed by Dr. Harlow Mares -She now presents almost 1 year later with new onset swelling of the area -Advised patient to follow-up with her surgeon regarding this new symptom - Can use NSAIDs as needed, advised on icing - Ambulatory referral to Orthopedic Surgery  3. Lipoma of left upper extremity - noted within last week or so, unclear how long it has been there - no pain  - will continue to monitor - if becomes painful or is growing in size, consider surgery referral for possible removal   Return for as scheduled for CPE.   The entirety of the information documented in the History of Present Illness, Review of Systems and Physical Exam were personally obtained by me. Portions of this information were initially documented by [facility], CMA and reviewed by me for thoroughness and accuracy.    Virginia Crews, MD, MPH [facility] 09/04/2018 1:32 PM

## 2018-09-04 ENCOUNTER — Other Ambulatory Visit: Payer: Self-pay | Admitting: Psychiatry

## 2018-09-09 DIAGNOSIS — G4733 Obstructive sleep apnea (adult) (pediatric): Secondary | ICD-10-CM | POA: Diagnosis not present

## 2018-09-10 ENCOUNTER — Other Ambulatory Visit: Payer: Self-pay | Admitting: Family Medicine

## 2018-09-29 ENCOUNTER — Telehealth: Payer: Self-pay

## 2018-09-29 NOTE — Telephone Encounter (Signed)
Patient advised.

## 2018-09-29 NOTE — Telephone Encounter (Signed)
Patient called saying that the citalopram 20mg  is not helping with her depression. She is requesting to possibly increase or change to a different medication. Contact info is correct. Thanks!

## 2018-09-29 NOTE — Telephone Encounter (Signed)
Attempted to contact patient, no answer and voicemail is full. Trying to advise her that Dr. Brita Romp is out of the office until Thursday. Will route the message to her, but patient will not receive a call back until Thursday.

## 2018-10-02 MED ORDER — CITALOPRAM HYDROBROMIDE 40 MG PO TABS: 40.0000 mg | ORAL_TABLET | Freq: Every day | ORAL | 3 refills | Status: DC

## 2018-10-02 NOTE — Telephone Encounter (Signed)
rx sent

## 2018-10-02 NOTE — Telephone Encounter (Signed)
We can increase citalopram to 40mg  daily and offer evisit with video if we need to talk about other options

## 2018-10-02 NOTE — Telephone Encounter (Signed)
Patient was advised.  

## 2018-10-02 NOTE — Telephone Encounter (Signed)
Patient states that she will try the 40 mg daily and follow up with Dr.B at office visit on 11/19/2018.

## 2018-10-02 NOTE — Addendum Note (Signed)
Addended by: Virginia Crews on: 10/02/2018 11:52 AM   Modules accepted: Orders

## 2018-10-15 ENCOUNTER — Encounter: Payer: Self-pay | Admitting: Family Medicine

## 2018-10-15 ENCOUNTER — Ambulatory Visit (INDEPENDENT_AMBULATORY_CARE_PROVIDER_SITE_OTHER): Payer: BLUE CROSS/BLUE SHIELD | Admitting: Family Medicine

## 2018-10-15 DIAGNOSIS — F332 Major depressive disorder, recurrent severe without psychotic features: Secondary | ICD-10-CM | POA: Diagnosis not present

## 2018-10-15 DIAGNOSIS — F419 Anxiety disorder, unspecified: Secondary | ICD-10-CM

## 2018-10-15 NOTE — Patient Instructions (Signed)
For next week: Take 20mg  Celexa (Citalopram) and 50mg  Zoloft (Sertraline) daily Recommend Zoloft dosing at bedtime After 1 week, stop celexa and increase to 100mg  Zoloft if tolerating well  We will follow-up in 1 month

## 2018-10-15 NOTE — Assessment & Plan Note (Signed)
Uncontrolled and worsening side effects Celexa seems to cause worsening fatigue and sleeping Will cross taper Celexa to Zoloft For 1 week, decrease celexa to 20mg  daily and start Zoloft 50mg  daily After 1 week, if tolerting ok, stop celexa and increase Zoloft ot 100 mg daily F/u in ~1 month( CPE is already scheduled) and repeat PHQ and GAD screenings Continue therapy Not currently seeing psych May need to consider SNRI if not doing well on zoloft

## 2018-10-15 NOTE — Progress Notes (Addendum)
Patient: Angelica Medina Female    DOB: Nov 29, 1960   58 y.o.   MRN: [id number] Visit Date: 10/15/2018  Today's Provider: Lavon Paganini, MD   Chief Complaint  Patient presents with  . Depression   Subjective:    Virtual Visit via Video Note  I connected with Angelica Medina on 10/15/18 at  1:20 PM EDT by a video enabled telemedicine application and verified that I am speaking with the correct person using two identifiers.   I discussed the limitations of evaluation and management by telemedicine and the availability of in person appointments. The patient expressed understanding and agreed to proceed.   Patient location: home Provider location: [place] involved in the visit: patient, provider    HPI Anxiety & Depression Follow Up:  Patient presents for a 5 month follow up. Last OV was on 05/21/2018. Patient advised to continue Prozac and Buspar at that time. She reports good compliance with treatment plan. Patient saw her psychiatrist (Dr Shea Evans) on 07/07/2018 and she was switched to Celexa from Prozac.She states symptoms are unchanged. She is requesting Celexa dose be increased.  She was increased to 40mg  2 weeks ago.  She is no longer on abilify per Psych as she was worried about this being an antipsychotic. She continues to take Buspar and Trazodone  Of note, patient did genetic testing for her depression.  She was found to have moderate interaction with Prozac and Wellbutrin and significant interaction with Paxil.  Everything else was no interaction.   GAD 7 : Generalized Anxiety Score 10/15/2018 05/21/2018 09/11/2017 07/26/2017  Nervous, Anxious, on Edge 2 2 2 2   Control/stop worrying 1 1 1 2   Worry too much - different things 1 1 1 2   Trouble relaxing 2 1 1 1   Restless 1 1 0 0  Easily annoyed or irritable 1 2 1 2   Afraid - awful might happen 0 0 1 2  Total GAD 7 Score 8 8 7 11   Anxiety Difficulty Not difficult at all Not difficult at all Somewhat  difficult Very difficult    Depression screen [facility] 2/9 10/15/2018 05/21/2018 09/11/2017  Decreased Interest 3 2 3   Down, Depressed, Hopeless 3 1 3   PHQ - 2 Score 6 3 6   Altered sleeping 3 0 3  Tired, decreased energy 3 1 3   Change in appetite 2 0 2  Feeling bad or failure about yourself  3 1 2   Trouble concentrating 3 1 1   Moving slowly or fidgety/restless 1 0 0  Suicidal thoughts 0 0 0  PHQ-9 Score 21 6 17   Difficult doing work/chores Extremely dIfficult Not difficult at all Very difficult    Allergies  Allergen Reactions  . Oxycodone Itching  . Wellbutrin [Bupropion] Anxiety     Current Outpatient Medications:  .  atorvastatin (LIPITOR) 20 MG tablet, TAKE ONE TABLET BY MOUTH DAILY, Disp: 90 tablet, Rfl: 4 .  b complex vitamins capsule, Take 1 capsule by mouth daily., Disp: , Rfl:  .  busPIRone (BUSPAR) 15 MG tablet, TAKE ONE TABLET BY MOUTH THREE TIMES A DAY, Disp: 270 tablet, Rfl: 0 .  chlorhexidine (HIBICLENS) 4 % external liquid, Apply topically daily as needed., Disp: 120 mL, Rfl: 1 .  metFORMIN (GLUCOPHAGE) 500 MG tablet, TAKE ONE TABLET BY MOUTH TWICE A DAY WITH MEALS, Disp: 180 tablet, Rfl: 3 .  Multiple Vitamin (MULTIVITAMIN) tablet, Take 1 tablet by mouth daily., Disp: , Rfl:  .  Omega-3 1000 MG CAPS, Take 1  tablet by mouth daily., Disp: , Rfl:  .  traZODone (DESYREL) 50 MG tablet, Take 2.5-3 tablets (125-150 mg total) by mouth at bedtime as needed for sleep., Disp: 90 tablet, Rfl: 1  Review of Systems  Constitutional: Negative.   Respiratory: Negative.   Cardiovascular: Negative.   Musculoskeletal: Negative.   Psychiatric/Behavioral: The patient is nervous/anxious.        Depression     Social History   Tobacco Use  . Smoking status: Former Smoker    Packs/day: 0.50    Years: 20.00    Pack years: 10.00    Last attempt to quit: 07/01/1994    Years since quitting: 24.3  . Smokeless tobacco: Never Used  Substance Use Topics  . Alcohol use: No       Objective:   There were no vitals taken for this visit. There were no vitals filed for this visit.   Physical Exam Constitutional:      Appearance: Normal appearance.  Pulmonary:     Effort: Pulmonary effort is normal. No respiratory distress.  Neurological:     Mental Status: She is alert and oriented to person, place, and time. Mental status is at baseline.  Psychiatric:        Attention and Perception: Attention normal.        Mood and Affect: Mood is depressed. Affect is flat.        Speech: Speech normal.        Behavior: Behavior normal.        Thought Content: Thought content does not include homicidal or suicidal ideation. Thought content does not include homicidal or suicidal plan.         Assessment & Plan    I discussed the assessment and treatment plan with the patient. The patient was provided an opportunity to ask questions and all were answered. The patient agreed with the plan and demonstrated an understanding of the instructions.   The patient was advised to call back or seek an in-person evaluation if the symptoms worsen or if the condition fails to improve as anticipated.  Problem List Items Addressed This Visit      Other   Depression - Primary    Uncontrolled and worsening side effects Celexa seems to cause worsening fatigue and sleeping Will cross taper Celexa to Zoloft For 1 week, decrease celexa to 20mg  daily and start Zoloft 50mg  daily After 1 week, if tolerting ok, stop celexa and increase Zoloft ot 100 mg daily F/u in ~1 month( CPE is already scheduled) and repeat PHQ and GAD screenings Continue therapy Not currently seeing psych May need to consider SNRI if not doing well on zoloft      Anxiety    Stable, but as above depression symptoms are worsening Will cross taper Celexa nad Zoloft as above Continue buspar at current dose Continue therapy F/u in 1 month as above          Return in about 4 weeks (around 11/12/2018) for depression  and anxiety f/u.   The entirety of the information documented in the History of Present Illness, Review of Systems and Physical Exam were personally obtained by me. Portions of this information were initially documented by Tiburcio Pea, CMA and reviewed by me for thoroughness and accuracy.    Virginia Crews, MD, MPH [facility] 10/15/2018 3:03 PM

## 2018-10-15 NOTE — Assessment & Plan Note (Signed)
Stable, but as above depression symptoms are worsening Will cross taper Celexa nad Zoloft as above Continue buspar at current dose Continue therapy F/u in 1 month as above

## 2018-10-16 ENCOUNTER — Telehealth: Payer: Self-pay

## 2018-10-16 NOTE — Telephone Encounter (Signed)
Patient called saying that we were supposed to send in Lexapro into the pharmacy. I advised that I did not see where she is to take Lexapro, but I did see where she is to continue Celexa for 1 week, then d/c and increase Zoloft to 100mg  next week.   Please review. Patient reports that she is SURE that you wanted her to take Lexapro. Contact info is correct. Patient is aware that you are out of the office, but receiving messages.

## 2018-10-17 NOTE — Telephone Encounter (Signed)
We definitely discussed Zoloft, not Lexapro.  Please go over the instructions from last note for patients cross-taper (going down on Celexa and starting Zoloft)

## 2018-10-17 NOTE — Telephone Encounter (Signed)
Patient advised as directed below and that she is sorry that she mixed the names but that it is the Zoloft prescription that she is needing. She uses [facility]. Patient was advised that it would be send on Monday since is the weekend.

## 2018-10-17 NOTE — Telephone Encounter (Signed)
Attempted to contact patient, no answer and voicemail is full.

## 2018-10-20 MED ORDER — SERTRALINE HCL 50 MG PO TABS: 100.0000 mg | ORAL_TABLET | Freq: Every day | ORAL | 2 refills | Status: DC

## 2018-10-20 NOTE — Telephone Encounter (Signed)
I am sorry that it wasn't sent earlier.  It should be available now at [facility]. Thanks!

## 2018-10-20 NOTE — Telephone Encounter (Signed)
Patient advised.

## 2018-10-29 ENCOUNTER — Other Ambulatory Visit: Payer: Self-pay | Admitting: Psychiatry

## 2018-11-05 DIAGNOSIS — H43812 Vitreous degeneration, left eye: Secondary | ICD-10-CM | POA: Diagnosis not present

## 2018-11-07 ENCOUNTER — Telehealth: Payer: Self-pay | Admitting: *Deleted

## 2018-11-07 NOTE — Telephone Encounter (Signed)
Patient was advised. Patient states that she is having increase anxiety, not wanting to get out of bed in the mornings, wants to lay around and not wanting to do anything.

## 2018-11-07 NOTE — Telephone Encounter (Signed)
Really have to give it 4-6 weeks to say if it works or not.  Can reassess at appt next week and consider something different.  Ensure no SI, please.

## 2018-11-07 NOTE — Telephone Encounter (Signed)
Let's move up the 5/20 appt to early next week instead

## 2018-11-07 NOTE — Telephone Encounter (Signed)
Patient called office stating that Zoloft is not working. Patient wanted to know other medication she can try? Please advise?

## 2018-11-10 ENCOUNTER — Ambulatory Visit (INDEPENDENT_AMBULATORY_CARE_PROVIDER_SITE_OTHER): Payer: BLUE CROSS/BLUE SHIELD | Admitting: Family Medicine

## 2018-11-10 ENCOUNTER — Telehealth: Payer: Self-pay | Admitting: *Deleted

## 2018-11-10 DIAGNOSIS — F332 Major depressive disorder, recurrent severe without psychotic features: Secondary | ICD-10-CM | POA: Diagnosis not present

## 2018-11-10 DIAGNOSIS — F419 Anxiety disorder, unspecified: Secondary | ICD-10-CM | POA: Diagnosis not present

## 2018-11-10 MED ORDER — DULOXETINE HCL 60 MG PO CPEP: 60.0000 mg | ORAL_CAPSULE | Freq: Every day | ORAL | 3 refills | Status: DC

## 2018-11-10 NOTE — Patient Instructions (Addendum)
Stop Zoloft Start Cymbalta 30mg  daily for 1 week and then 60mg  daily Continue trazodone and buspar

## 2018-11-10 NOTE — Telephone Encounter (Signed)
Patient had e-visit today. Patient was advised to half Cymbalta. Patient states she was prescribed capsules. Please advise dosage?

## 2018-11-10 NOTE — Telephone Encounter (Signed)
Appointment was moved up.

## 2018-11-10 NOTE — Progress Notes (Signed)
Patient: Angelica Medina Female    DOB: 07-22-1960   58 y.o.   MRN: [id number] Visit Date: 11/10/2018  Today's Provider: Lavon Paganini, MD   Chief Complaint  Patient presents with  . Depression  . Anxiety   Subjective:    I, Porsha McClurkin CMA, am acting as a scribe for Lavon Paganini, MD.   Virtual Visit via Video Note  I connected with Angelica Medina on 11/10/18 at 10:20 AM EDT by a video enabled telemedicine application and verified that I am speaking with the correct person using two identifiers.   Patient location: home Provider location: home office  Persons involved in the visit: patient, provider   I discussed the limitations of evaluation and management by telemedicine and the availability of in person appointments. The patient expressed understanding and agreed to proceed.   HPI  Anxiety & Depression Patient presents today for depression and anxiety. Patient medication was changed to Zoloft and she states not so good tolerance with the treatment. Patient was last seen on 10/15/2018 for her depression and states that the Zoloft is making it worse. Patient states that she is not wanting to get out of bed, becoming agitated more, decrease in concentration and more nervous.  Denies any SI, but does endorse thoughts that she'd be better off dead occasionally.  Did genesight testing and Paxil, Prozac and Wellbutrin were low efficacy for her.  All others were good.  Did not have relief of symptoms with Celexa.  GAD 7 : Generalized Anxiety Score 11/10/2018 10/15/2018 05/21/2018 09/11/2017  Nervous, Anxious, on Edge 3 2 2 2   Control/stop worrying 3 1 1 1   Worry too much - different things 3 1 1 1   Trouble relaxing 3 2 1 1   Restless 0 1 1 0  Easily annoyed or irritable 2 1 2 1   Afraid - awful might happen 3 0 0 1  Total GAD 7 Score 17 8 8 7   Anxiety Difficulty Very difficult Not difficult at all Not difficult at all Somewhat difficult   Depression screen [facility] 2/9  11/10/2018 10/15/2018 05/21/2018 09/11/2017 07/26/2017  Decreased Interest 3 3 2 3 3   Down, Depressed, Hopeless 3 3 1 3 3   PHQ - 2 Score 6 6 3 6 6   Altered sleeping 3 3 0 3 3  Tired, decreased energy 3 3 1 3 3   Change in appetite 2 2 0 2 1  Feeling bad or failure about yourself  3 3 1 2 3   Trouble concentrating 3 3 1 1 1   Moving slowly or fidgety/restless 0 1 0 0 0  Suicidal thoughts 1 0 0 0 0  PHQ-9 Score 21 21 6 17 17   Difficult doing work/chores Very difficult Extremely dIfficult Not difficult at all Very difficult Very difficult   Allergies  Allergen Reactions  . Oxycodone Itching  . Wellbutrin [Bupropion] Anxiety     Current Outpatient Medications:  .  atorvastatin (LIPITOR) 20 MG tablet, TAKE ONE TABLET BY MOUTH DAILY, Disp: 90 tablet, Rfl: 4 .  b complex vitamins capsule, Take 1 capsule by mouth daily., Disp: , Rfl:  .  busPIRone (BUSPAR) 15 MG tablet, TAKE ONE TABLET BY MOUTH THREE TIMES A DAY, Disp: 270 tablet, Rfl: 0 .  chlorhexidine (HIBICLENS) 4 % external liquid, Apply topically daily as needed., Disp: 120 mL, Rfl: 1 .  metFORMIN (GLUCOPHAGE) 500 MG tablet, TAKE ONE TABLET BY MOUTH TWICE A DAY WITH MEALS, Disp: 180 tablet, Rfl: 3 .  Multiple  Vitamin (MULTIVITAMIN) tablet, Take 1 tablet by mouth daily., Disp: , Rfl:  .  Omega-3 1000 MG CAPS, Take 1 tablet by mouth daily., Disp: , Rfl:  .  traZODone (DESYREL) 50 MG tablet, Take 2.5-3 tablets (125-150 mg total) by mouth at bedtime as needed for sleep., Disp: 90 tablet, Rfl: 1 .  DULoxetine (CYMBALTA) 60 MG capsule, Take 1 capsule (60 mg total) by mouth daily., Disp: 30 capsule, Rfl: 3  Review of Systems  Constitutional: Negative.   Respiratory: Negative.   Genitourinary: Negative.   Neurological: Positive for headaches (slight).  Psychiatric/Behavioral: Positive for agitation and decreased concentration. Negative for self-injury and suicidal ideas. The patient is nervous/anxious.     Social History   Tobacco Use  .  Smoking status: Former Smoker    Packs/day: 0.50    Years: 20.00    Pack years: 10.00    Last attempt to quit: 07/01/1994    Years since quitting: 24.3  . Smokeless tobacco: Never Used  Substance Use Topics  . Alcohol use: No      Objective:   There were no vitals taken for this visit. There were no vitals filed for this visit.   Physical Exam Constitutional:      Appearance: Normal appearance.  Pulmonary:     Effort: Pulmonary effort is normal. No respiratory distress.  Neurological:     Mental Status: She is alert and oriented to person, place, and time. Mental status is at baseline.  Psychiatric:        Mood and Affect: Mood is anxious and depressed. Affect is flat.        Speech: Speech normal.        Thought Content: Thought content does not include homicidal or suicidal ideation. Thought content does not include homicidal or suicidal plan.         Assessment & Plan  Follow Up Instructions: I discussed the assessment and treatment plan with the patient. The patient was provided an opportunity to ask questions and all were answered. The patient agreed with the plan and demonstrated an understanding of the instructions.   The patient was advised to call back or seek an in-person evaluation if the symptoms worsen or if the condition fails to improve as anticipated.  Problem List Items Addressed This Visit      Other   Depression    Chronic and uncontrolled Worsening of depression anxiety symptoms Has significant side effects and lack of control with Celexa Now with lack of control Zoloft Continue therapy Given that she has failed 3 SSRIs, we will try an SNRI instead Discussed Effexor and Cymbalta We will proceed with Cymbalta 30 mg daily for 1 week and then 60 mg daily No cross-taper at this time will stop Zoloft and start Celexa Not currently seeing psych Follow-up in 1 month and repeat PHQ and gad screenings at that time      Relevant Medications    DULoxetine (CYMBALTA) 60 MG capsule   Anxiety - Primary    Chronic and uncontrolled As above, will stop Zoloft and start Cymbalta instead We will get to goal dose quickly to improve efficacy Continue therapy Not currently seeing psych Follow-up in 1 month Repeat PHQ 9 and GAD 7 screenings at next visit      Relevant Medications   DULoxetine (CYMBALTA) 60 MG capsule       Return in about 4 weeks (around 12/08/2018) for CPE and anxiety/depression f/u.   The entirety of the information documented in  the History of Present Illness, Review of Systems and Physical Exam were personally obtained by me. Portions of this information were initially documented by [facility], CMA and reviewed by me for thoroughness and accuracy.    Virginia Crews, MD, MPH [facility] 11/10/2018 2:49 PM

## 2018-11-10 NOTE — Assessment & Plan Note (Signed)
Chronic and uncontrolled Worsening of depression anxiety symptoms Has significant side effects and lack of control with Celexa Now with lack of control Zoloft Continue therapy Given that she has failed 3 SSRIs, we will try an SNRI instead Discussed Effexor and Cymbalta We will proceed with Cymbalta 30 mg daily for 1 week and then 60 mg daily No cross-taper at this time will stop Zoloft and start Celexa Not currently seeing psych Follow-up in 1 month and repeat PHQ and gad screenings at that time

## 2018-11-10 NOTE — Assessment & Plan Note (Signed)
Chronic and uncontrolled As above, will stop Zoloft and start Cymbalta instead We will get to goal dose quickly to improve efficacy Continue therapy Not currently seeing psych Follow-up in 1 month Repeat PHQ 9 and GAD 7 screenings at next visit

## 2018-11-11 NOTE — Telephone Encounter (Signed)
Oops. She can just go ahead with the 60mg  capsule daily

## 2018-11-11 NOTE — Telephone Encounter (Signed)
Patient was advised.  

## 2018-11-11 NOTE — Telephone Encounter (Signed)
Please advise 

## 2018-11-19 ENCOUNTER — Encounter: Payer: Self-pay | Admitting: Family Medicine

## 2018-11-19 DIAGNOSIS — H43811 Vitreous degeneration, right eye: Secondary | ICD-10-CM | POA: Diagnosis not present

## 2018-11-19 DIAGNOSIS — H43812 Vitreous degeneration, left eye: Secondary | ICD-10-CM | POA: Diagnosis not present

## 2018-12-09 ENCOUNTER — Other Ambulatory Visit: Payer: Self-pay | Admitting: Psychiatry

## 2018-12-09 DIAGNOSIS — F331 Major depressive disorder, recurrent, moderate: Secondary | ICD-10-CM

## 2018-12-11 ENCOUNTER — Other Ambulatory Visit: Payer: Self-pay | Admitting: Psychiatry

## 2018-12-11 ENCOUNTER — Other Ambulatory Visit: Payer: Self-pay | Admitting: Family Medicine

## 2018-12-11 DIAGNOSIS — F331 Major depressive disorder, recurrent, moderate: Secondary | ICD-10-CM

## 2018-12-11 MED ORDER — TRAZODONE HCL 50 MG PO TABS: 125.0000 mg | ORAL_TABLET | Freq: Every evening | ORAL | 1 refills | Status: DC | PRN

## 2018-12-11 NOTE — Telephone Encounter (Signed)
[facility] faxed refill request for the following medications:   traZODone (DESYREL) 50 MG tablet    Please advise.  

## 2019-01-01 ENCOUNTER — Other Ambulatory Visit: Payer: Self-pay | Admitting: Psychiatry

## 2019-01-03 ENCOUNTER — Other Ambulatory Visit: Payer: Self-pay | Admitting: Psychiatry

## 2019-01-05 ENCOUNTER — Other Ambulatory Visit: Payer: Self-pay | Admitting: Family Medicine

## 2019-01-05 NOTE — Telephone Encounter (Signed)
[facility] faxed refill request for the following medications:  busPIRone (BUSPAR) 15 MG tablet   Please advise.

## 2019-01-06 NOTE — Telephone Encounter (Signed)
Looks like another provider fills this prescription.

## 2019-01-08 ENCOUNTER — Other Ambulatory Visit: Payer: Self-pay

## 2019-01-08 MED ORDER — BUSPIRONE HCL 15 MG PO TABS: 15.0000 mg | ORAL_TABLET | Freq: Three times a day (TID) | ORAL | 1 refills | Status: DC

## 2019-01-08 NOTE — Telephone Encounter (Signed)
Patient called office stating that Kristopher Oppenheim has faxed several request to our office for Buspar and has not received a response back. Please review. KW

## 2019-01-14 ENCOUNTER — Telehealth: Payer: Self-pay | Admitting: Family Medicine

## 2019-01-14 NOTE — Telephone Encounter (Signed)
Pt called about her antidepressant.  She said what she is taking is the best she has had but that Dr. B talked about adding Aripiprazole. She feels like she would like to add this medication to see if it helps even more.  CB#  [phone]  teri

## 2019-01-15 ENCOUNTER — Other Ambulatory Visit: Payer: Self-pay

## 2019-01-15 NOTE — Telephone Encounter (Signed)
Due for f/u for MDD and GAD.  Recommend evisit.  We will discuss abilify and possibly add this then

## 2019-01-15 NOTE — Telephone Encounter (Signed)
LMTCB 01/15/2019  Contact number: [phone]  Thanks,   -Mickel Baas

## 2019-01-19 ENCOUNTER — Ambulatory Visit: Payer: Self-pay | Admitting: Family Medicine

## 2019-01-22 NOTE — Progress Notes (Signed)
Patient: Angelica Medina Female    DOB: January 10, 1961   58 y.o.   MRN: [id number] Visit Date: 01/22/2019  Today's Provider: Lavon Paganini, MD   Chief Complaint  Patient presents with  . Anxiety    Follow -up  . Depression   Subjective:    I, Porsha McClurkin CMA, am acting as a Education administrator for Lavon Paganini, MD.   Virtual Visit via Video Note  I connected with Angelica Medina on 01/22/19 at  8:20 AM EDT by a video enabled telemedicine application and verified that I am speaking with the correct person using two identifiers.   Patient location: home Provider location: home office Persons involved in the visit: patient, provider   I discussed the limitations of evaluation and management by telemedicine and the availability of in person appointments. The patient expressed understanding and agreed to proceed.  Interactive audio and video communications were attempted, although failed due to patient's inability to connect to video. Continued visit with audio only interaction with patient agreement.    Anxiety & Depression Patient presents today for anxiety & depression follow-up. Patient had a virtual visit on 10/15/2018. Patient is currently taking Cymbalta 60 MG & Buspar 15 MG. Patient states good compliance with treatment. Patient report been fatigue a little.  GAD 7 : Generalized Anxiety Score 01/22/2019 11/10/2018 10/15/2018 05/21/2018  Nervous, Anxious, on Edge 1 3 2 2   Control/stop worrying 0 3 1 1   Worry too much - different things 0 3 1 1   Trouble relaxing 0 3 2 1   Restless 0 0 1 1  Easily annoyed or irritable 1 2 1 2   Afraid - awful might happen 0 3 0 0  Total GAD 7 Score 2 17 8 8   Anxiety Difficulty Not difficult at all Very difficult Not difficult at all Not difficult at all    Depression screen [facility] 2/9 01/22/2019 11/10/2018 10/15/2018 05/21/2018 09/11/2017  Decreased Interest 2 3 3 2 3   Down, Depressed, Hopeless 0 3 3 1 3   PHQ - 2 Score 2 6 6 3 6   Altered sleeping  0 3 3 0 3  Tired, decreased energy 3 3 3 1 3   Change in appetite 1 2 2  0 2  Feeling bad or failure about yourself  1 3 3 1 2   Trouble concentrating 1 3 3 1 1   Moving slowly or fidgety/restless 0 0 1 0 0  Suicidal thoughts 0 1 0 0 0  PHQ-9 Score 8 21 21 6 17   Difficult doing work/chores Not difficult at all Very difficult Extremely dIfficult Not difficult at all Very difficult  Some recent data might be hidden    Allergies  Allergen Reactions  . Oxycodone Itching  . Wellbutrin [Bupropion] Anxiety     Current Outpatient Medications:  .  atorvastatin (LIPITOR) 20 MG tablet, TAKE ONE TABLET BY MOUTH DAILY, Disp: 90 tablet, Rfl: 4 .  b complex vitamins capsule, Take 1 capsule by mouth daily., Disp: , Rfl:  .  busPIRone (BUSPAR) 15 MG tablet, Take 1 tablet (15 mg total) by mouth 3 (three) times daily., Disp: 270 tablet, Rfl: 1 .  chlorhexidine (HIBICLENS) 4 % external liquid, Apply topically daily as needed., Disp: 120 mL, Rfl: 1 .  DULoxetine (CYMBALTA) 60 MG capsule, Take 1 capsule (60 mg total) by mouth daily., Disp: 30 capsule, Rfl: 3 .  metFORMIN (GLUCOPHAGE) 500 MG tablet, TAKE ONE TABLET BY MOUTH TWICE A DAY WITH MEALS, Disp: 180 tablet, Rfl: 3 .  Multiple Vitamin (MULTIVITAMIN) tablet, Take 1 tablet by mouth daily., Disp: , Rfl:  .  Omega-3 1000 MG CAPS, Take 1 tablet by mouth daily., Disp: , Rfl:  .  traZODone (DESYREL) 50 MG tablet, Take 2.5-3 tablets (125-150 mg total) by mouth at bedtime as needed for sleep., Disp: 90 tablet, Rfl: 1  Review of Systems  Constitutional: Positive for fatigue.  Respiratory: Negative.   Genitourinary: Negative.   Neurological: Negative.   Psychiatric/Behavioral: Positive for depression. Negative for decreased concentration.    Social History   Tobacco Use  . Smoking status: Former Smoker    Packs/day: 0.50    Years: 20.00    Pack years: 10.00    Quit date: 07/01/1994    Years since quitting: 24.5  . Smokeless tobacco: Never Used   Substance Use Topics  . Alcohol use: No      Objective:   There were no vitals taken for this visit. There were no vitals filed for this visit.   Physical Exam   No results found for any visits on 01/23/19.     Assessment & Plan    I discussed the assessment and treatment plan with the patient. The patient was provided an opportunity to ask questions and all were answered. The patient agreed with the plan and demonstrated an understanding of the instructions.   The patient was advised to call back or seek an in-person evaluation if the symptoms worsen or if the condition fails to improve as anticipated.  Problem List Items Addressed This Visit      Cardiovascular and Mediastinum   Hypertension    Previously well controlled/even slightly hypotensive Not currently on medications Recheck BP when comes in for labs Recheck CMP F/u in 3 motnhs      Relevant Orders   Comprehensive metabolic panel     Endocrine   T2DM (type 2 diabetes mellitus) ([place])    Previously well controlled Continue Metformin Recheck A1c Catch up on screenings/vaccines at CPE in 3 months      Relevant Orders   Hemoglobin A1c     Nervous and Auditory   Diabetic neuropathy (HCC)    Has declined gabapentin in the past Continue B complex Check B12 given use of Metformin      Relevant Orders   B12     Other   Depression - Primary    Chronic and much improved Discussed that fatigue may be relted to may other conditions Continue Cymbalta at current dose Could consider augmentation with Abilify 2mg  daily in the future      Anxiety    Chronic, well controlled Continue Cymbalta and Buspar Advised on benefits of therapy      Fatigue    Worsening recently Depression and anxiety seem well controlled Patient is exercising Could be multifactorial Will check labs - CBC, CMP, TSH, Vit D, B12 - to see if any underlying cause If not, could consider augmentation for depression as above       Relevant Orders   TSH   B12   VITAMIN D 25 Hydroxy (Vit-D Deficiency, Fractures)   CBC w/Diff/Platelet       Return in about 3 months (around 04/25/2019) for CPE.   The entirety of the information documented in the History of Present Illness, Review of Systems and Physical Exam were personally obtained by me. Portions of this information were initially documented by [facility], CMA and reviewed by me for thoroughness and accuracy.    Virginia Crews, MD  [facility]

## 2019-01-22 NOTE — Telephone Encounter (Signed)
Done

## 2019-01-23 ENCOUNTER — Ambulatory Visit (INDEPENDENT_AMBULATORY_CARE_PROVIDER_SITE_OTHER): Payer: Self-pay | Admitting: Family Medicine

## 2019-01-23 ENCOUNTER — Encounter: Payer: Self-pay | Admitting: Family Medicine

## 2019-01-23 ENCOUNTER — Other Ambulatory Visit: Payer: Self-pay

## 2019-01-23 DIAGNOSIS — R5383 Other fatigue: Secondary | ICD-10-CM

## 2019-01-23 DIAGNOSIS — E1142 Type 2 diabetes mellitus with diabetic polyneuropathy: Secondary | ICD-10-CM | POA: Diagnosis not present

## 2019-01-23 DIAGNOSIS — F419 Anxiety disorder, unspecified: Secondary | ICD-10-CM

## 2019-01-23 DIAGNOSIS — F330 Major depressive disorder, recurrent, mild: Secondary | ICD-10-CM

## 2019-01-23 DIAGNOSIS — I10 Essential (primary) hypertension: Secondary | ICD-10-CM | POA: Diagnosis not present

## 2019-01-23 NOTE — Patient Instructions (Signed)
No changes to medications  Come in for labs at your convenience

## 2019-01-23 NOTE — Assessment & Plan Note (Signed)
Worsening recently Depression and anxiety seem well controlled Patient is exercising Could be multifactorial Will check labs - CBC, CMP, TSH, Vit D, B12 - to see if any underlying cause If not, could consider augmentation for depression as above

## 2019-01-23 NOTE — Assessment & Plan Note (Signed)
Previously well controlled Continue Metformin Recheck A1c Catch up on screenings/vaccines at CPE in 3 months

## 2019-01-23 NOTE — Assessment & Plan Note (Signed)
Chronic, well controlled Continue Cymbalta and Buspar Advised on benefits of therapy

## 2019-01-23 NOTE — Assessment & Plan Note (Signed)
Has declined gabapentin in the past Continue B complex Check B12 given use of Metformin

## 2019-01-23 NOTE — Assessment & Plan Note (Addendum)
Chronic and much improved Discussed that fatigue may be relted to may other conditions Continue Cymbalta at current dose Could consider augmentation with Abilify 2mg  daily in the future

## 2019-01-23 NOTE — Assessment & Plan Note (Signed)
Previously well controlled/even slightly hypotensive Not currently on medications Recheck BP when comes in for labs Recheck CMP F/u in 3 motnhs

## 2019-01-24 LAB — CBC WITH DIFFERENTIAL/PLATELET
Basophils Absolute: 0.1 10*3/uL (ref 0.0–0.2)
Basos: 1 %
EOS (ABSOLUTE): 0.2 10*3/uL (ref 0.0–0.4)
Eos: 3 %
Hematocrit: 40.1 % (ref 34.0–46.6)
Hemoglobin: 12.7 g/dL (ref 11.1–15.9)
Immature Grans (Abs): 0 10*3/uL (ref 0.0–0.1)
Immature Granulocytes: 0 %
Lymphocytes Absolute: 2.6 10*3/uL (ref 0.7–3.1)
Lymphs: 32 %
MCH: 27.9 pg (ref 26.6–33.0)
MCHC: 31.7 g/dL (ref 31.5–35.7)
MCV: 88 fL (ref 79–97)
Monocytes Absolute: 0.5 10*3/uL (ref 0.1–0.9)
Monocytes: 6 %
Neutrophils Absolute: 4.7 10*3/uL (ref 1.4–7.0)
Neutrophils: 58 %
Platelets: 347 10*3/uL (ref 150–450)
RBC: 4.55 x10E6/uL (ref 3.77–5.28)
RDW: 13.2 % (ref 11.7–15.4)
WBC: 8.1 10*3/uL (ref 3.4–10.8)

## 2019-01-24 LAB — COMPREHENSIVE METABOLIC PANEL
ALT: 19 IU/L (ref 0–32)
AST: 16 IU/L (ref 0–40)
Albumin/Globulin Ratio: 1.5 (ref 1.2–2.2)
Albumin: 4.5 g/dL (ref 3.8–4.9)
Alkaline Phosphatase: 94 IU/L (ref 39–117)
BUN/Creatinine Ratio: 18 (ref 9–23)
BUN: 13 mg/dL (ref 6–24)
Bilirubin Total: 0.3 mg/dL (ref 0.0–1.2)
CO2: 24 mmol/L (ref 20–29)
Calcium: 10.2 mg/dL (ref 8.7–10.2)
Chloride: 96 mmol/L (ref 96–106)
Creatinine, Ser: 0.74 mg/dL (ref 0.57–1.00)
GFR calc Af Amer: 103 mL/min/{1.73_m2} (ref >59)
GFR calc non Af Amer: 90 mL/min/{1.73_m2} (ref >59)
Globulin, Total: 3 g/dL (ref 1.5–4.5)
Glucose: 111 mg/dL — ABNORMAL HIGH (ref 65–99)
Potassium: 4.6 mmol/L (ref 3.5–5.2)
Sodium: 137 mmol/L (ref 134–144)
Total Protein: 7.5 g/dL (ref 6.0–8.5)

## 2019-01-24 LAB — TSH: TSH: 1.94 u[IU]/mL (ref 0.450–4.500)

## 2019-01-24 LAB — VITAMIN D 25 HYDROXY (VIT D DEFICIENCY, FRACTURES): Vit D, 25-Hydroxy: 48.6 ng/mL (ref 30.0–100.0)

## 2019-01-24 LAB — VITAMIN B12: Vitamin B-12: 776 pg/mL (ref 232–1245)

## 2019-01-24 LAB — HEMOGLOBIN A1C
Est. average glucose Bld gHb Est-mCnc: 137 mg/dL
Hgb A1c MFr Bld: 6.4 % — ABNORMAL HIGH (ref 4.8–5.6)

## 2019-01-27 ENCOUNTER — Telehealth: Payer: Self-pay

## 2019-01-27 DIAGNOSIS — M9903 Segmental and somatic dysfunction of lumbar region: Secondary | ICD-10-CM | POA: Diagnosis not present

## 2019-01-27 DIAGNOSIS — M5136 Other intervertebral disc degeneration, lumbar region: Secondary | ICD-10-CM | POA: Diagnosis not present

## 2019-01-27 DIAGNOSIS — M9905 Segmental and somatic dysfunction of pelvic region: Secondary | ICD-10-CM | POA: Diagnosis not present

## 2019-01-27 DIAGNOSIS — M5431 Sciatica, right side: Secondary | ICD-10-CM | POA: Diagnosis not present

## 2019-01-27 NOTE — Telephone Encounter (Signed)
-----   Message from Virginia Crews, MD sent at 01/27/2019  8:41 AM EDT ----- Normal labs. A1c remains well controlled

## 2019-01-27 NOTE — Telephone Encounter (Signed)
Patient was advised.  

## 2019-02-27 DIAGNOSIS — M5431 Sciatica, right side: Secondary | ICD-10-CM | POA: Diagnosis not present

## 2019-02-27 DIAGNOSIS — M9905 Segmental and somatic dysfunction of pelvic region: Secondary | ICD-10-CM | POA: Diagnosis not present

## 2019-02-27 DIAGNOSIS — M9903 Segmental and somatic dysfunction of lumbar region: Secondary | ICD-10-CM | POA: Diagnosis not present

## 2019-02-27 DIAGNOSIS — M5136 Other intervertebral disc degeneration, lumbar region: Secondary | ICD-10-CM | POA: Diagnosis not present

## 2019-03-06 ENCOUNTER — Other Ambulatory Visit: Payer: Self-pay | Admitting: Family Medicine

## 2019-03-22 ENCOUNTER — Other Ambulatory Visit: Payer: Self-pay | Admitting: Family Medicine

## 2019-03-22 DIAGNOSIS — F331 Major depressive disorder, recurrent, moderate: Secondary | ICD-10-CM

## 2019-03-23 NOTE — Telephone Encounter (Signed)
L.O.V. was on 01/23/2019 and upcoming visit is 04/28/2019.

## 2019-04-28 ENCOUNTER — Encounter: Payer: Self-pay | Admitting: Family Medicine

## 2019-05-11 ENCOUNTER — Other Ambulatory Visit: Payer: Self-pay | Admitting: Family Medicine

## 2019-05-11 DIAGNOSIS — F331 Major depressive disorder, recurrent, moderate: Secondary | ICD-10-CM

## 2019-06-03 ENCOUNTER — Other Ambulatory Visit: Payer: Self-pay | Admitting: Family Medicine

## 2019-07-04 ENCOUNTER — Other Ambulatory Visit: Payer: Self-pay | Admitting: Family Medicine

## 2019-07-05 NOTE — Telephone Encounter (Signed)
Requested medication (s) are due for refill today: yes  Requested medication (s) are on the active medication list: yes  Last refill:  06/29/2019  Future visit scheduled: no  Notes to clinic:  Qty for tablets is only for 18. Please review    Requested Prescriptions  Pending Prescriptions Disp Refills   busPIRone (BUSPAR) 15 MG tablet [Pharmacy Med Name: busPIRone HCL 15 MG TABLET] 18 tablet 0    Sig: TAKE ONE TABLET BY MOUTH THREE TIMES A DAY      Psychiatry: Anxiolytics/Hypnotics - Non-controlled Passed - 07/04/2019 11:31 AM      Passed - Valid encounter within last 6 months    Recent Outpatient Visits           5 months ago Mild episode of recurrent major depressive disorder [facility])   [facility] [place], Dionne Bucy, MD   7 months ago [place], Dionne Bucy, MD   8 months ago Severe episode of recurrent major depressive disorder, without psychotic features [facility])   [facility], Dionne Bucy, MD   10 months ago Wrist swelling, left   [facility] [place], Dionne Bucy, MD   1 year ago Type 2 diabetes mellitus with diabetic polyneuropathy, without long-term current use of insulin [facility])   [facility], Dionne Bucy, MD

## 2019-07-29 NOTE — Patient Instructions (Signed)

## 2019-07-31 ENCOUNTER — Ambulatory Visit (INDEPENDENT_AMBULATORY_CARE_PROVIDER_SITE_OTHER): Payer: BC Managed Care – PPO | Admitting: Family Medicine

## 2019-07-31 ENCOUNTER — Encounter: Payer: Self-pay | Admitting: Family Medicine

## 2019-07-31 ENCOUNTER — Other Ambulatory Visit: Payer: Self-pay

## 2019-07-31 VITALS — BP 128/72 | HR 65 | Temp 96.9°F | Ht 67.5 in | Wt 237.0 lb

## 2019-07-31 DIAGNOSIS — I10 Essential (primary) hypertension: Secondary | ICD-10-CM | POA: Diagnosis not present

## 2019-07-31 DIAGNOSIS — G4733 Obstructive sleep apnea (adult) (pediatric): Secondary | ICD-10-CM

## 2019-07-31 DIAGNOSIS — E041 Nontoxic single thyroid nodule: Secondary | ICD-10-CM

## 2019-07-31 DIAGNOSIS — E1142 Type 2 diabetes mellitus with diabetic polyneuropathy: Secondary | ICD-10-CM | POA: Diagnosis not present

## 2019-07-31 DIAGNOSIS — E1169 Type 2 diabetes mellitus with other specified complication: Secondary | ICD-10-CM

## 2019-07-31 DIAGNOSIS — E669 Obesity, unspecified: Secondary | ICD-10-CM | POA: Diagnosis not present

## 2019-07-31 DIAGNOSIS — Z0000 Encounter for general adult medical examination without abnormal findings: Secondary | ICD-10-CM

## 2019-07-31 DIAGNOSIS — Z6832 Body mass index (BMI) 32.0-32.9, adult: Secondary | ICD-10-CM | POA: Diagnosis not present

## 2019-07-31 DIAGNOSIS — Z23 Encounter for immunization: Secondary | ICD-10-CM

## 2019-07-31 DIAGNOSIS — E782 Mixed hyperlipidemia: Secondary | ICD-10-CM

## 2019-07-31 DIAGNOSIS — E785 Hyperlipidemia, unspecified: Secondary | ICD-10-CM

## 2019-07-31 DIAGNOSIS — Z1231 Encounter for screening mammogram for malignant neoplasm of breast: Secondary | ICD-10-CM

## 2019-07-31 DIAGNOSIS — F419 Anxiety disorder, unspecified: Secondary | ICD-10-CM

## 2019-07-31 DIAGNOSIS — F3341 Major depressive disorder, recurrent, in partial remission: Secondary | ICD-10-CM

## 2019-07-31 NOTE — Assessment & Plan Note (Signed)
Has declined gabapentin in the past Continue B complex Reviewed B12 from 6 months ago - normal

## 2019-07-31 NOTE — Addendum Note (Signed)
Addended by: Ashley Royalty E on: 07/31/2019 11:31 AM   Modules accepted: Orders

## 2019-07-31 NOTE — Assessment & Plan Note (Signed)
Chronic and well controlled Continue Cymbalta at current dose She has done genetic swab for therapy previously

## 2019-07-31 NOTE — Assessment & Plan Note (Signed)
Discussed importance of healthy weight management Discussed diet and exercise  

## 2019-07-31 NOTE — Assessment & Plan Note (Signed)
Chronic and well controlled Continue cymbatla and buspar and trazodone

## 2019-07-31 NOTE — Assessment & Plan Note (Signed)
TSH has been normal - will recheck Repeat thyroid US until nodules are stable x5 yrs

## 2019-07-31 NOTE — Assessment & Plan Note (Signed)
Continue CPAP.  

## 2019-07-31 NOTE — Assessment & Plan Note (Signed)
Well controlled Not on medications Recheck metabolic panel F/u in 6 months

## 2019-07-31 NOTE — Progress Notes (Signed)
Patient: Angelica Medina, Female    DOB: Sep 17, 1960, 59 y.o.   MRN: [id number] Visit Date: 07/31/2019  Today's Provider: Lavon Paganini, MD   Chief Complaint  Patient presents with  . Annual Exam   Subjective:     Annual physical exam Angelica Medina is a 59 y.o. female who presents today for health maintenance and complete physical. She feels well. She reports exercising. She reports she is sleeping fairly well.  -----------------------------------------------------------------   Review of Systems  Constitutional: Negative.   HENT: Negative.   Eyes: Negative.   Respiratory: Negative.   Cardiovascular: Negative.   Gastrointestinal: Negative.   Endocrine: Negative.   Genitourinary: Negative.   Musculoskeletal: Negative.   Skin: Negative.   Allergic/Immunologic: Negative.   Neurological: Negative.   Hematological: Negative.   Psychiatric/Behavioral: Negative.     Social History      She  reports that she quit smoking about 25 years ago. She has a 10.00 pack-year smoking history. She has never used smokeless tobacco. She reports that she does not drink alcohol or use drugs.       Social History   Socioeconomic History  . Marital status: Married    Spouse name: [name]  . Number of children: 3  . Years of education: 47  . Highest education level: High school graduate  Occupational History  . 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 Smoking status: Former Smoker    Packs/day: 0.50    Years: 20.00    Pack years: 10.00    Quit date: 07/01/1994    Years since quitting: 25.0  . Smokeless tobacco: Never Used  Substance and Sexual Activity  . Alcohol use: No  . Drug use: No  . Sexual activity: Not Currently  Other Topics Concern  . Not on file  Social History Narrative  . Not on file   Social Determinants of Health   Financial Resource Strain:   . Difficulty of Paying Living Expenses: Not on file  Food Insecurity:   . Worried About Paediatric nurse in the Last Year: Not on file  . Ran Out of Food in the Last Year: Not on file  Transportation Needs:   . Lack of Transportation (Medical): Not on file  . Lack of Transportation (Non-Medical): Not on file  Physical Activity:   . Days of Exercise per Week: Not on file  . Minutes of Exercise per Session: Not on file  Stress:   . Feeling of Stress : Not on file  Social Connections:   . Frequency of Communication with Friends and Family: Not on file  . Frequency of Social Gatherings with Friends and Family: Not on file  . Attends Religious Services: Not on file  . Active Member of Clubs or Organizations: Not on file  . Attends Archivist Meetings: Not on file  . Marital Status: Not on file    Past Medical History:  Diagnosis Date  . Anxiety   . Depression   . Hypertension 2016  . Sleep apnea   . T2DM (type 2 diabetes mellitus) ([place]) 2016   diet controlled     Patient Active Problem List   Diagnosis Date Noted  . Fatigue 01/23/2019  . Lipoma of left upper extremity 11/18/2017  . Hyperlipidemia associated with type 2 diabetes mellitus ([place]) 06/28/2017  . Diabetic neuropathy ([place]) 06/28/2017  . OSA (obstructive sleep apnea) 02/28/2017  . Obesity 02/28/2017  . Thyroid nodule 02/28/2017  .  Healthcare maintenance 02/28/2017  . Depression   . Anxiety   . T2DM (type 2 diabetes mellitus) ([place]) 07/02/2014  . Hypertension 07/02/2014    Past Surgical History:  Procedure Laterality Date  . [place]   for herniated disc, no hardware  . BREAST BIOPSY Left 2009   benign  . ECTOPIC PREGNANCY SURGERY    . ESOPHAGEAL DILATION  2014   has had several i nthe past for achalasia  . OPEN REDUCTION INTERNAL FIXATION (ORIF) DISTAL RADIAL FRACTURE Left 09/17/2017   Procedure: OPEN REDUCTION INTERNAL FIXATION (ORIF) DISTAL RADIAL FRACTURE;  Surgeon: Lovell Sheehan, MD;  Location: [facility] ORS;  Service: Orthopedics;  Laterality: Left;    Family History          Family Status  Relation Name Status  . Mother  Alive  . Father  Deceased  . Sister  Alive  . Brother  Alive  . Brother  Deceased       accidental  . Sister  Alive  . Ethlyn Daniels  (Not Specified)  . MGF  (Not Specified)  . PGF  (Not Specified)  . PGM  (Not Specified)  . Other  Deceased        Her family history includes Alcohol abuse in her maternal grandfather, paternal grandfather, and paternal grandmother; Anxiety disorder in her brother, brother, father, mother, sister, and sister; COPD in her father; Depression in her father, mother, and sister; Diabetes in her father; Drug abuse in her paternal aunt and another family member; Healthy in her brother; Hypothyroidism in her sister; Thyroid cancer in her mother.      Allergies  Allergen Reactions  . Oxycodone Itching  . Wellbutrin [Bupropion] Anxiety     Current Outpatient Medications:  .  atorvastatin (LIPITOR) 20 MG tablet, TAKE ONE TABLET BY MOUTH DAILY, Disp: 90 tablet, Rfl: 4 .  b complex vitamins capsule, Take 1 capsule by mouth daily., Disp: , Rfl:  .  busPIRone (BUSPAR) 15 MG tablet, TAKE ONE TABLET BY MOUTH THREE TIMES A DAY, Disp: 90 tablet, Rfl: 5 .  DULoxetine (CYMBALTA) 60 MG capsule, TAKE ONE CAPSULE BY MOUTH DAILY, Disp: 30 capsule, Rfl: 1 .  metFORMIN (GLUCOPHAGE) 500 MG tablet, TAKE ONE TABLET BY MOUTH TWICE A DAY WITH MEALS, Disp: 180 tablet, Rfl: 3 .  Multiple Vitamin (MULTIVITAMIN) tablet, Take 1 tablet by mouth daily., Disp: , Rfl:  .  Omega-3 1000 MG CAPS, Take 1 tablet by mouth daily., Disp: , Rfl:  .  traZODone (DESYREL) 50 MG tablet, TAKE 2.5 - 3 TABLETS BY MOUTH AT BEDTIME AS NEEDED FOR SLEEP, Disp: 90 tablet, Rfl: 5   Patient Care Team: Virginia Crews, MD as PCP - General (Family Medicine)    Objective:    Vitals: BP 128/72 (BP Location: Left Arm, Patient Position: Sitting, Cuff Size: Large)   Pulse 65   Temp (!) 96.9 F (36.1 C) (Temporal)   Ht 5' 7.5" (1.715 m)   Wt 237 lb (107.5 kg)    SpO2 98%   BMI 36.57 kg/m    Vitals:   07/31/19 0903  BP: 128/72  Pulse: 65  Temp: (!) 96.9 F (36.1 C)  TempSrc: Temporal  SpO2: 98%  Weight: 237 lb (107.5 kg)  Height: 5' 7.5" (1.715 m)     Physical Exam Vitals reviewed.  Constitutional:      General: She is not in acute distress.    Appearance: Normal appearance. She is well-developed. She is not diaphoretic.  HENT:     Head: Normocephalic and atraumatic.     Right Ear: Tympanic membrane, ear canal and external ear normal.     Left Ear: Tympanic membrane, ear canal and external ear normal.  Eyes:     General: No scleral icterus.    Conjunctiva/sclera: Conjunctivae normal.     Pupils: Pupils are equal, round, and reactive to light.  Neck:     Thyroid: No thyromegaly.  Cardiovascular:     Rate and Rhythm: Normal rate and regular rhythm.     Pulses: Normal pulses.     Heart sounds: Normal heart sounds. No murmur.  Pulmonary:     Effort: Pulmonary effort is normal. No respiratory distress.     Breath sounds: Normal breath sounds. No wheezing or rales.  Abdominal:     General: There is no distension.     Palpations: Abdomen is soft.     Tenderness: There is no abdominal tenderness. There is no guarding or rebound.  Genitourinary:    Comments: Breasts: breasts appear normal, no suspicious masses, no skin or nipple changes or axillary nodes.  Musculoskeletal:        General: No deformity.     Cervical back: Neck supple.     Right lower leg: No edema.     Left lower leg: No edema.  Lymphadenopathy:     Cervical: No cervical adenopathy.  Skin:    General: Skin is warm and dry.     Capillary Refill: Capillary refill takes less than 2 seconds.     Findings: No rash.  Neurological:     Mental Status: She is alert and oriented to person, place, and time. Mental status is at baseline.  Psychiatric:        Mood and Affect: Mood normal.        Behavior: Behavior normal.        Thought Content: Thought content normal.       Depression Screen PHQ 2/9 Scores 07/31/2019 01/22/2019 11/10/2018 10/15/2018  PHQ - 2 Score 2 2 6 6   PHQ- 9 Score 7 8 21 21        Assessment & Plan:     Routine Health Maintenance and Physical Exam  Exercise Activities and Dietary recommendations Goals   None     Immunization History  Administered Date(s) Administered  . Influenza,inj,Quad PF,6+ Mos 02/28/2017, 05/21/2018, 03/10/2019  . Pneumococcal Polysaccharide-23 02/28/2017  . Tdap 12/22/2010    Health Maintenance  Topic Date Due  . OPHTHALMOLOGY EXAM  05/02/2018  . HEMOGLOBIN A1C  07/26/2019  . FOOT EXAM  07/30/2020  . MAMMOGRAM  08/26/2020  . TETANUS/TDAP  12/21/2020  . COLONOSCOPY  05/20/2021  . PAP SMEAR-Modifier  02/28/2022  . INFLUENZA VACCINE  Completed  . PNEUMOCOCCAL POLYSACCHARIDE VACCINE AGE 48-64 HIGH RISK  Completed  . Hepatitis C Screening  Completed  . HIV Screening  Completed     Discussed health benefits of physical activity, and encouraged her to engage in regular exercise appropriate for her age and condition.    --------------------------------------------------------------------  Problem List Items Addressed This Visit      Cardiovascular and Mediastinum   Hypertension    Well controlled Not on medications Recheck metabolic panel F/u in 6 months       Relevant Orders   Comprehensive metabolic panel     Respiratory   OSA (obstructive sleep apnea)    Continue CPAP        Endocrine   T2DM (type 2 diabetes mellitus) ([place])  Well controlled previously Associated with/complicated by HTN, HLD, OSA Continue current medications UTD on vaccines, urine microalbumin wil lsend ROI for last eye exam foot exam completed today On Statin Discussed diet and exercise Recheck A1c F/u in 6 months       Relevant Orders   Hemoglobin A1c   Thyroid nodule    TSH has been normal - will recheck Repeat thyroid US until nodules are stable x5 yrs      Relevant Orders   US THYROID    TSH   Hyperlipidemia associated with type 2 diabetes mellitus ([place])    Tolerating Lipitor well - continue  Recheck CMP and FLP      Diabetic neuropathy ([place])    Has declined gabapentin in the past Continue B complex Reviewed B12 from 6 months ago - normal        Other   Depression    Chronic and well controlled Continue Cymbalta at current dose She has done genetic swab for therapy previously      Anxiety    Chronic and well controlled Continue cymbatla and buspar and trazodone      Obesity    Discussed importance of healthy weight management Discussed diet and exercise       Relevant Orders   Lipid panel   Comprehensive metabolic panel   Hemoglobin A1c    Other Visit Diagnoses    Encounter for annual physical exam    -  Primary   Relevant Orders   MM 3D SCREEN BREAST BILATERAL   TSH   Lipid panel   Comprehensive metabolic panel   Hemoglobin A1c   Encounter for screening mammogram for malignant neoplasm of breast       Relevant Orders   MM 3D SCREEN BREAST BILATERAL       Return in about 6 months (around 01/28/2020) for chronic disease f/u.   The entirety of the information documented in the History of Present Illness, Review of Systems and Physical Exam were personally obtained by me. Portions of this information were initially documented by Ashley Royalty, CMA and reviewed by me for thoroughness and accuracy.    Angelica Medina, Dionne Bucy, MD MPH [facility]

## 2019-07-31 NOTE — Assessment & Plan Note (Signed)
Tolerating Lipitor well - continue  Recheck CMP and FLP

## 2019-07-31 NOTE — Assessment & Plan Note (Addendum)
Well controlled previously Associated with/complicated by HTN, HLD, OSA Continue current medications UTD on vaccines, urine microalbumin wil lsend ROI for last eye exam foot exam completed today On Statin Discussed diet and exercise Recheck A1c F/u in 6 months

## 2019-08-01 LAB — LIPID PANEL
Chol/HDL Ratio: 2.5 ratio (ref 0.0–4.4)
Cholesterol, Total: 142 mg/dL (ref 100–199)
HDL: 57 mg/dL (ref >39)
LDL Chol Calc (NIH): 65 mg/dL (ref 0–99)
Triglycerides: 113 mg/dL (ref 0–149)
VLDL Cholesterol Cal: 20 mg/dL (ref 5–40)

## 2019-08-01 LAB — COMPREHENSIVE METABOLIC PANEL
ALT: 25 IU/L (ref 0–32)
AST: 24 IU/L (ref 0–40)
Albumin/Globulin Ratio: 1.5 (ref 1.2–2.2)
Albumin: 4.4 g/dL (ref 3.8–4.9)
Alkaline Phosphatase: 90 IU/L (ref 39–117)
BUN/Creatinine Ratio: 17 (ref 9–23)
BUN: 12 mg/dL (ref 6–24)
Bilirubin Total: 0.4 mg/dL (ref 0.0–1.2)
CO2: 23 mmol/L (ref 20–29)
Calcium: 9.6 mg/dL (ref 8.7–10.2)
Chloride: 99 mmol/L (ref 96–106)
Creatinine, Ser: 0.7 mg/dL (ref 0.57–1.00)
GFR calc Af Amer: 110 mL/min/{1.73_m2} (ref >59)
GFR calc non Af Amer: 96 mL/min/{1.73_m2} (ref >59)
Globulin, Total: 2.9 g/dL (ref 1.5–4.5)
Glucose: 108 mg/dL — ABNORMAL HIGH (ref 65–99)
Potassium: 4.4 mmol/L (ref 3.5–5.2)
Sodium: 138 mmol/L (ref 134–144)
Total Protein: 7.3 g/dL (ref 6.0–8.5)

## 2019-08-01 LAB — HEMOGLOBIN A1C
Est. average glucose Bld gHb Est-mCnc: 143 mg/dL
Hgb A1c MFr Bld: 6.6 % — ABNORMAL HIGH (ref 4.8–5.6)

## 2019-08-01 LAB — TSH: TSH: 1.39 u[IU]/mL (ref 0.450–4.500)

## 2019-08-03 ENCOUNTER — Other Ambulatory Visit: Payer: Self-pay | Admitting: Family Medicine

## 2019-08-03 ENCOUNTER — Telehealth: Payer: Self-pay

## 2019-08-03 DIAGNOSIS — E7800 Pure hypercholesterolemia, unspecified: Secondary | ICD-10-CM

## 2019-08-03 NOTE — Telephone Encounter (Signed)
Pt advised.   Thanks,   -Jeilyn Reznik  

## 2019-08-03 NOTE — Telephone Encounter (Signed)
-----   Message from Virginia Crews, MD sent at 08/03/2019  1:04 PM EST ----- Normal labs

## 2019-08-10 ENCOUNTER — Ambulatory Visit: Payer: BLUE CROSS/BLUE SHIELD

## 2019-08-12 ENCOUNTER — Telehealth: Payer: Self-pay

## 2019-08-12 ENCOUNTER — Ambulatory Visit
Admission: RE | Admit: 2019-08-12 | Discharge: 2019-08-12 | Disposition: A | Discharge status: Home / Self Care | Payer: BC Managed Care – PPO | Source: Ambulatory Visit | Attending: Family Medicine | Admitting: Family Medicine

## 2019-08-12 ENCOUNTER — Other Ambulatory Visit: Payer: Self-pay

## 2019-08-12 DIAGNOSIS — E041 Nontoxic single thyroid nodule: Secondary | ICD-10-CM | POA: Insufficient documentation

## 2019-08-12 DIAGNOSIS — E042 Nontoxic multinodular goiter: Secondary | ICD-10-CM | POA: Diagnosis not present

## 2019-08-12 NOTE — Telephone Encounter (Signed)
Patient advised as below.  

## 2019-08-12 NOTE — Telephone Encounter (Signed)
-----   Message from Virginia Crews, MD sent at 08/12/2019  4:34 PM EST ----- Nodules are stable and no longer need to be imaged annually.

## 2019-09-01 DIAGNOSIS — E118 Type 2 diabetes mellitus with unspecified complications: Secondary | ICD-10-CM | POA: Diagnosis not present

## 2019-09-14 ENCOUNTER — Telehealth: Payer: Self-pay

## 2019-09-14 ENCOUNTER — Ambulatory Visit
Admission: RE | Admit: 2019-09-14 | Discharge: 2019-09-14 | Disposition: A | Discharge status: Home / Self Care | Payer: BC Managed Care – PPO | Source: Ambulatory Visit | Attending: Family Medicine | Admitting: Family Medicine

## 2019-09-14 DIAGNOSIS — Z1231 Encounter for screening mammogram for malignant neoplasm of breast: Secondary | ICD-10-CM | POA: Diagnosis not present

## 2019-09-14 DIAGNOSIS — Z0000 Encounter for general adult medical examination without abnormal findings: Secondary | ICD-10-CM

## 2019-09-14 NOTE — Telephone Encounter (Signed)
Patient advised as below.  

## 2019-09-14 NOTE — Telephone Encounter (Signed)
-----   Message from Virginia Crews, MD sent at 09/14/2019  2:14 PM EDT ----- Normal mammogram. Repeat in 1 yr

## 2019-10-02 DIAGNOSIS — E118 Type 2 diabetes mellitus with unspecified complications: Secondary | ICD-10-CM | POA: Diagnosis not present

## 2019-10-04 ENCOUNTER — Other Ambulatory Visit: Payer: Self-pay | Admitting: Family Medicine

## 2019-10-04 NOTE — Telephone Encounter (Signed)
Requested Prescriptions  Pending Prescriptions Disp Refills  . metFORMIN (GLUCOPHAGE) 500 MG tablet [Pharmacy Med Name: metFORMIN HCL 500 MG TABLET] 180 tablet 1    Sig: TAKE ONE TABLET BY MOUTH TWICE A DAY WITH MEALS     Endocrinology:  Diabetes - Biguanides Failed - 10/04/2019 12:32 PM      Failed - Valid encounter within last 6 months    Recent Outpatient Visits          2 months ago Encounter for annual physical exam   [facility] [place], Dionne Bucy, MD   8 months ago Mild episode of recurrent major depressive disorder [facility])   [facility], Dionne Bucy, MD   10 months ago [place], Dionne Bucy, MD   11 months ago Severe episode of recurrent major depressive disorder, without psychotic features [facility])   [facility] [place], Dionne Bucy, MD   1 year ago Wrist swelling, left   [facility] [place], Dionne Bucy, MD      Future Appointments            In 3 months Bacigalupo, Dionne Bucy, MD [facility]           Passed - Cr in normal range and within 360 days    Creatinine, Ser  Date Value Ref Range Status  07/31/2019 0.70 0.57 - 1.00 mg/dL Final         Passed - HBA1C is between 0 and 7.9 and within 180 days    Hgb A1c MFr Bld  Date Value Ref Range Status  07/31/2019 6.6 (H) 4.8 - 5.6 % Final    Comment:             Prediabetes: 5.7 - 6.4          Diabetes: >6.4          Glycemic control for adults with diabetes: <7.0          Passed - eGFR in normal range and within 360 days    GFR calc Af Amer  Date Value Ref Range Status  07/31/2019 110 >59 mL/min/1.73 Final   GFR calc non Af Amer  Date Value Ref Range Status  07/31/2019 96 >59 mL/min/1.73 Final

## 2019-10-27 ENCOUNTER — Telehealth: Payer: Self-pay | Admitting: Family Medicine

## 2019-10-27 NOTE — Telephone Encounter (Signed)
Medication Refill - Medication:   DULoxetine (CYMBALTA) 60 MG capsule [id number]   Patient is requesting a lower dosage of this medication . Please advise

## 2019-10-28 MED ORDER — DULOXETINE HCL 30 MG PO CPEP: 30.0000 mg | ORAL_CAPSULE | Freq: Every day | ORAL | 3 refills | Status: DC

## 2019-10-28 NOTE — Telephone Encounter (Signed)
If she wants to decrease, we can go down to 30mg  daily of her Cymbalta.  Ok to send #[id number]

## 2019-11-19 DIAGNOSIS — M9905 Segmental and somatic dysfunction of pelvic region: Secondary | ICD-10-CM | POA: Diagnosis not present

## 2019-11-19 DIAGNOSIS — M5431 Sciatica, right side: Secondary | ICD-10-CM | POA: Diagnosis not present

## 2019-11-19 DIAGNOSIS — M9903 Segmental and somatic dysfunction of lumbar region: Secondary | ICD-10-CM | POA: Diagnosis not present

## 2019-11-19 DIAGNOSIS — M5136 Other intervertebral disc degeneration, lumbar region: Secondary | ICD-10-CM | POA: Diagnosis not present

## 2019-11-23 DIAGNOSIS — M9905 Segmental and somatic dysfunction of pelvic region: Secondary | ICD-10-CM | POA: Diagnosis not present

## 2019-11-23 DIAGNOSIS — M5136 Other intervertebral disc degeneration, lumbar region: Secondary | ICD-10-CM | POA: Diagnosis not present

## 2019-11-23 DIAGNOSIS — M9903 Segmental and somatic dysfunction of lumbar region: Secondary | ICD-10-CM | POA: Diagnosis not present

## 2019-11-23 DIAGNOSIS — M5431 Sciatica, right side: Secondary | ICD-10-CM | POA: Diagnosis not present

## 2019-11-25 ENCOUNTER — Ambulatory Visit (INDEPENDENT_AMBULATORY_CARE_PROVIDER_SITE_OTHER): Payer: BC Managed Care – PPO | Admitting: Family Medicine

## 2019-11-25 ENCOUNTER — Encounter: Payer: Self-pay | Admitting: Family Medicine

## 2019-11-25 ENCOUNTER — Other Ambulatory Visit: Payer: Self-pay

## 2019-11-25 VITALS — BP 126/71 | HR 66 | Temp 97.1°F | Resp 16 | Wt 210.0 lb

## 2019-11-25 DIAGNOSIS — R1084 Generalized abdominal pain: Secondary | ICD-10-CM

## 2019-11-25 MED ORDER — DICYCLOMINE HCL 10 MG PO CAPS: 10.0000 mg | ORAL_CAPSULE | Freq: Three times a day (TID) | ORAL | 0 refills | Status: DC

## 2019-11-25 NOTE — Progress Notes (Signed)
Established patient visit   Patient: Angelica Medina   DOB: 17-Jan-1961   59 y.o. Female  MRN: [id number] Visit Date: 11/25/2019  I,Sulibeya S Dimas,acting as a scribe for Lavon Paganini, MD.,have documented all relevant documentation on the behalf of Lavon Paganini, MD,as directed by  Lavon Paganini, MD while in the presence of Lavon Paganini, MD.  Today's healthcare provider: Lavon Paganini, MD   Chief Complaint  Patient presents with  . Abdominal Pain   Subjective    Abdominal Pain This is a new problem. The current episode started in the past 7 days. The onset quality is gradual. The problem occurs constantly. The problem has been gradually improving. The pain is located in the generalized abdominal region. The pain is at a severity of 4/10. The quality of the pain is aching, burning and cramping. The abdominal pain does not radiate. Associated symptoms include belching, constipation, diarrhea and flatus. Pertinent negatives include no anorexia, arthralgias, dysuria, fever, headaches, hematochezia, hematuria, melena, myalgias, nausea, vomiting or weight loss. Nothing aggravates the pain. The pain is relieved by nothing. Treatments tried: Gasx, and laxative. The treatment provided no relief. Her past medical history is significant for GERD. There is no history of abdominal surgery, Crohn's disease, gallstones or irritable bowel syndrome.   Patient Active Problem List   Diagnosis Date Noted  . Fatigue 01/23/2019  . Lipoma of left upper extremity 11/18/2017  . Hyperlipidemia associated with type 2 diabetes mellitus ([place]) 06/28/2017  . Diabetic neuropathy ([place]) 06/28/2017  . OSA (obstructive sleep apnea) 02/28/2017  . Obesity 02/28/2017  . Thyroid nodule 02/28/2017  . Healthcare maintenance 02/28/2017  . Depression   . Anxiety   . T2DM (type 2 diabetes mellitus) ([place]) 07/02/2014  . Hypertension 07/02/2014   Social History   Tobacco Use  . Smoking status: Former  Smoker    Packs/day: 0.50    Years: 20.00    Pack years: 10.00    Quit date: 07/01/1994    Years since quitting: 25.4  . Smokeless tobacco: Never Used  Substance Use Topics  . Alcohol use: No  . Drug use: No   Medications: Outpatient Medications Prior to Visit  Medication Sig  . atorvastatin (LIPITOR) 20 MG tablet TAKE ONE TABLET BY MOUTH DAILY  . b complex vitamins capsule Take 1 capsule by mouth daily.  . busPIRone (BUSPAR) 15 MG tablet TAKE ONE TABLET BY MOUTH THREE TIMES A DAY (Patient taking differently: Take 15 mg by mouth 2 (two) times daily. )  . DULoxetine (CYMBALTA) 30 MG capsule Take 1 capsule (30 mg total) by mouth daily.  . metFORMIN (GLUCOPHAGE) 500 MG tablet TAKE ONE TABLET BY MOUTH TWICE A DAY WITH MEALS  . Multiple Vitamin (MULTIVITAMIN) tablet Take 1 tablet by mouth daily.  . Omega-3 1000 MG CAPS Take 1 tablet by mouth daily.  . traZODone (DESYREL) 50 MG tablet TAKE 2.5 - 3 TABLETS BY MOUTH AT BEDTIME AS NEEDED FOR SLEEP (Patient taking differently: Take 100 mg by mouth at bedtime. )   No facility-administered medications prior to visit.   Review of Systems  Constitutional: Negative for fever and weight loss.  Gastrointestinal: Positive for abdominal pain, constipation, diarrhea and flatus. Negative for anorexia, hematochezia, melena, nausea and vomiting.  Genitourinary: Negative for dysuria and hematuria.  Musculoskeletal: Negative for arthralgias and myalgias.  Neurological: Negative for headaches.    Objective    BP 126/71 (BP Location: Left Arm, Patient Position: Sitting, Cuff Size: Normal)   Pulse 66  Temp (!) 97.1 F (36.2 C) (Temporal)   Resp 16   Wt 210 lb (95.3 kg)   BMI 32.41 kg/m   Wt Readings from Last 3 Encounters:  11/25/19 210 lb (95.3 kg)  07/31/19 237 lb (107.5 kg)  09/03/18 209 lb 12.8 oz (95.2 kg)   Physical Exam Constitutional:      General: She is not in acute distress.    Appearance: She is well-developed. She is not  diaphoretic.  HENT:     Head: Normocephalic and atraumatic.  Eyes:     General: No scleral icterus. Cardiovascular:     Rate and Rhythm: Normal rate and regular rhythm.     Heart sounds: Normal heart sounds. No murmur.  Pulmonary:     Effort: Pulmonary effort is normal. No respiratory distress.     Breath sounds: Normal breath sounds. No wheezing or rhonchi.  Abdominal:     General: Bowel sounds are normal.     Tenderness: There is generalized abdominal tenderness. There is no guarding or rebound. Negative signs include Murphy's sign and McBurney's sign.  Skin:    General: Skin is warm and dry.     Findings: No rash.  Neurological:     Mental Status: She is alert and oriented to person, place, and time.  Psychiatric:        Mood and Affect: Mood normal.        Behavior: Behavior normal.      No results found for any visits on 11/25/19.  Assessment & Plan     1. Generalized abdominal pain - new problem x3-4 days - started after eating processed food, which she has been away from for some  - then had constipation and took stimulant laxative - now with nonbloody diarrhea and cramping abd pain - suspect that eating processed foods and taking stimulant laxative has caused colon spasms and abd cramping - start Bentyl prn - avoid triggers - no red flags on exam - discussed strict return precautions  Return for as scheduled.      I, Lavon Paganini, MD, have reviewed all documentation for this visit. The documentation on 11/26/19 for the exam, diagnosis, procedures, and orders are all accurate and complete.   Earnstine Meinders, Dionne Bucy, MD, MPH [facility]

## 2019-11-26 DIAGNOSIS — M5431 Sciatica, right side: Secondary | ICD-10-CM | POA: Diagnosis not present

## 2019-11-26 DIAGNOSIS — M5136 Other intervertebral disc degeneration, lumbar region: Secondary | ICD-10-CM | POA: Diagnosis not present

## 2019-11-26 DIAGNOSIS — M9903 Segmental and somatic dysfunction of lumbar region: Secondary | ICD-10-CM | POA: Diagnosis not present

## 2019-11-26 DIAGNOSIS — M9905 Segmental and somatic dysfunction of pelvic region: Secondary | ICD-10-CM | POA: Diagnosis not present

## 2019-12-01 ENCOUNTER — Ambulatory Visit: Payer: Self-pay | Admitting: *Deleted

## 2019-12-01 NOTE — Telephone Encounter (Signed)
Pt called in with questions regarding shingles.   Her husband was dx with shingles at the [facility] on Sunday.   He is being treated.  She has had her first shingles vaccine about 3 months ago.   When can she get the 2nd dose?    Is her husband contagious and for how long? Does he have to wait a year in order to get the shingles vaccine?  I let her know I would call her back after seeing what I could find out.  After looking on the CDC web site I called her back. I let her know her husband is contagious until the blisters have crusted over.  Wife has had chickenpox and her first dose of shingles vaccine so she is protected.  I also let her know the answer to how long he had to wait until getting the vaccine after having the shingles.   Per the CDC web site he can get the Shingrix vaccine as long as the shingles has subsided and residual symptoms have gone away.  She mentioned her mother had heard he had to wait a year after having shingles before he could get the vaccine.   I let Mikailah know that was the recommendation from [facility] but for the [place] it was as mentioned above.  She thanked me very much for calling her back and answering her questions.       Reason for Disposition . Shingles, questions about  Answer Assessment - Initial Assessment Questions 1. APPEARANCE of RASH: "Describe the rash."      Husband has shingles.  They have blisters.  No drainage but a little.  He went to [facility] on Sunday.   Dx with shingles.   Rash started Tuesday on husband. 2. LOCATION: "Where is the rash located?"      Wife calling in had her first shingles vaccine 3 months ago. 3. ONSET: "When did the rash start?"      Last Tuesday on husband. 4. ITCHING: "Does the rash itch?" If so, ask: "How bad is the itch?"  (Scale 1-10; or mild, moderate, severe)     She wants to know when can get next shingles vaccine.   Can she get the shingles. 5. PAIN: "Does the rash hurt?" If so, ask: "How bad is  the pain?"  (Scale 1-10; or mild, moderate, severe)     *No Answer* 6. OTHER SYMPTOMS: "Do you have any other symptoms?" (e.g., fever)     *No Answer* 7. PREGNANCY: "Is there any chance you are pregnant?" "When was your last menstrual period?"     *No Answer*  Protocols used: [facility]

## 2019-12-02 DIAGNOSIS — M9905 Segmental and somatic dysfunction of pelvic region: Secondary | ICD-10-CM | POA: Diagnosis not present

## 2019-12-02 DIAGNOSIS — M5431 Sciatica, right side: Secondary | ICD-10-CM | POA: Diagnosis not present

## 2019-12-02 DIAGNOSIS — M5136 Other intervertebral disc degeneration, lumbar region: Secondary | ICD-10-CM | POA: Diagnosis not present

## 2019-12-02 DIAGNOSIS — M9903 Segmental and somatic dysfunction of lumbar region: Secondary | ICD-10-CM | POA: Diagnosis not present

## 2019-12-04 DIAGNOSIS — M5136 Other intervertebral disc degeneration, lumbar region: Secondary | ICD-10-CM | POA: Diagnosis not present

## 2019-12-04 DIAGNOSIS — M9905 Segmental and somatic dysfunction of pelvic region: Secondary | ICD-10-CM | POA: Diagnosis not present

## 2019-12-04 DIAGNOSIS — M9903 Segmental and somatic dysfunction of lumbar region: Secondary | ICD-10-CM | POA: Diagnosis not present

## 2019-12-04 DIAGNOSIS — M5431 Sciatica, right side: Secondary | ICD-10-CM | POA: Diagnosis not present

## 2019-12-08 DIAGNOSIS — M5136 Other intervertebral disc degeneration, lumbar region: Secondary | ICD-10-CM | POA: Diagnosis not present

## 2019-12-08 DIAGNOSIS — M9905 Segmental and somatic dysfunction of pelvic region: Secondary | ICD-10-CM | POA: Diagnosis not present

## 2019-12-08 DIAGNOSIS — M9903 Segmental and somatic dysfunction of lumbar region: Secondary | ICD-10-CM | POA: Diagnosis not present

## 2019-12-08 DIAGNOSIS — M5431 Sciatica, right side: Secondary | ICD-10-CM | POA: Diagnosis not present

## 2019-12-10 DIAGNOSIS — M5431 Sciatica, right side: Secondary | ICD-10-CM | POA: Diagnosis not present

## 2019-12-10 DIAGNOSIS — M9905 Segmental and somatic dysfunction of pelvic region: Secondary | ICD-10-CM | POA: Diagnosis not present

## 2019-12-10 DIAGNOSIS — M5136 Other intervertebral disc degeneration, lumbar region: Secondary | ICD-10-CM | POA: Diagnosis not present

## 2019-12-10 DIAGNOSIS — M9903 Segmental and somatic dysfunction of lumbar region: Secondary | ICD-10-CM | POA: Diagnosis not present

## 2019-12-14 ENCOUNTER — Other Ambulatory Visit: Payer: Self-pay | Admitting: Family Medicine

## 2019-12-18 DIAGNOSIS — M9905 Segmental and somatic dysfunction of pelvic region: Secondary | ICD-10-CM | POA: Diagnosis not present

## 2019-12-18 DIAGNOSIS — M9903 Segmental and somatic dysfunction of lumbar region: Secondary | ICD-10-CM | POA: Diagnosis not present

## 2019-12-18 DIAGNOSIS — M5136 Other intervertebral disc degeneration, lumbar region: Secondary | ICD-10-CM | POA: Diagnosis not present

## 2019-12-18 DIAGNOSIS — M5431 Sciatica, right side: Secondary | ICD-10-CM | POA: Diagnosis not present

## 2019-12-22 DIAGNOSIS — M5136 Other intervertebral disc degeneration, lumbar region: Secondary | ICD-10-CM | POA: Diagnosis not present

## 2019-12-22 DIAGNOSIS — M9903 Segmental and somatic dysfunction of lumbar region: Secondary | ICD-10-CM | POA: Diagnosis not present

## 2019-12-22 DIAGNOSIS — M9905 Segmental and somatic dysfunction of pelvic region: Secondary | ICD-10-CM | POA: Diagnosis not present

## 2019-12-22 DIAGNOSIS — M5431 Sciatica, right side: Secondary | ICD-10-CM | POA: Diagnosis not present

## 2019-12-29 DIAGNOSIS — M9903 Segmental and somatic dysfunction of lumbar region: Secondary | ICD-10-CM | POA: Diagnosis not present

## 2019-12-29 DIAGNOSIS — M5136 Other intervertebral disc degeneration, lumbar region: Secondary | ICD-10-CM | POA: Diagnosis not present

## 2019-12-29 DIAGNOSIS — M5431 Sciatica, right side: Secondary | ICD-10-CM | POA: Diagnosis not present

## 2019-12-29 DIAGNOSIS — M9905 Segmental and somatic dysfunction of pelvic region: Secondary | ICD-10-CM | POA: Diagnosis not present

## 2020-01-05 DIAGNOSIS — M5136 Other intervertebral disc degeneration, lumbar region: Secondary | ICD-10-CM | POA: Diagnosis not present

## 2020-01-05 DIAGNOSIS — M9903 Segmental and somatic dysfunction of lumbar region: Secondary | ICD-10-CM | POA: Diagnosis not present

## 2020-01-05 DIAGNOSIS — M5431 Sciatica, right side: Secondary | ICD-10-CM | POA: Diagnosis not present

## 2020-01-05 DIAGNOSIS — M9905 Segmental and somatic dysfunction of pelvic region: Secondary | ICD-10-CM | POA: Diagnosis not present

## 2020-01-06 LAB — HEMOGLOBIN A1C: Hemoglobin A1C: 5.9

## 2020-01-06 LAB — BASIC METABOLIC PANEL
BUN: 12 (ref 4–21)
CO2: 25 — AB (ref 13–22)
Chloride: 103 (ref 99–108)
Creatinine: 0.7 (ref <=1.1)
Glucose: 108
Potassium: 4.5 (ref 3.4–5.3)
Sodium: 140 (ref 137–147)

## 2020-01-06 LAB — COMPREHENSIVE METABOLIC PANEL
Albumin: 4.4 (ref 3.5–5.0)
Calcium: 9.5 (ref 8.7–10.7)
GFR calc Af Amer: 103
GFR calc non Af Amer: 89
Globulin: 3

## 2020-01-06 LAB — HEPATIC FUNCTION PANEL
ALT: 13 (ref 7–35)
AST: 16 (ref 13–35)
Alkaline Phosphatase: 71 (ref 25–125)
Bilirubin, Total: 0.5

## 2020-01-06 LAB — LIPID PANEL
Cholesterol: 121 (ref 0–200)
HDL: 50 (ref 35–70)
LDL Cholesterol: 50
LDl/HDL Ratio: 58
Triglycerides: 57 (ref 40–160)

## 2020-01-10 ENCOUNTER — Other Ambulatory Visit: Payer: Self-pay | Admitting: Family Medicine

## 2020-01-10 NOTE — Telephone Encounter (Signed)
Requested Prescriptions  Pending Prescriptions Disp Refills  . dicyclomine (BENTYL) 10 MG capsule [Pharmacy Med Name: DICYCLOMINE 10 MG CAPSULE] 90 capsule 0    Sig: TAKE ONE CAPSULE BY MOUTH FOUR TIMES A DAY - BEFORE MEALS AND AT BEDTIME     Gastroenterology:  Antispasmodic Agents Passed - 01/10/2020  6:50 AM      Passed - Last Heart Rate in normal range    Pulse Readings from Last 1 Encounters:  11/25/19 66         Passed - Valid encounter within last 12 months    Recent Outpatient Visits          1 month ago Generalized abdominal pain   [facility] [place], Dionne Bucy, MD   5 months ago Encounter for annual physical exam   [facility] [place], Dionne Bucy, MD   11 months ago Mild episode of recurrent major depressive disorder [facility])   [facility], Dionne Bucy, MD   1 year ago [place], Dionne Bucy, MD   1 year ago Severe episode of recurrent major depressive disorder, without psychotic features [facility])   [place], Dionne Bucy, MD      Future Appointments            In 2 weeks Terrilee Croak, Wendee Beavers, PA-C [facility], [place]

## 2020-01-12 DIAGNOSIS — M5136 Other intervertebral disc degeneration, lumbar region: Secondary | ICD-10-CM | POA: Diagnosis not present

## 2020-01-12 DIAGNOSIS — M5431 Sciatica, right side: Secondary | ICD-10-CM | POA: Diagnosis not present

## 2020-01-12 DIAGNOSIS — M9903 Segmental and somatic dysfunction of lumbar region: Secondary | ICD-10-CM | POA: Diagnosis not present

## 2020-01-12 DIAGNOSIS — M9905 Segmental and somatic dysfunction of pelvic region: Secondary | ICD-10-CM | POA: Diagnosis not present

## 2020-01-22 DIAGNOSIS — M9903 Segmental and somatic dysfunction of lumbar region: Secondary | ICD-10-CM | POA: Diagnosis not present

## 2020-01-22 DIAGNOSIS — M5136 Other intervertebral disc degeneration, lumbar region: Secondary | ICD-10-CM | POA: Diagnosis not present

## 2020-01-22 DIAGNOSIS — M9905 Segmental and somatic dysfunction of pelvic region: Secondary | ICD-10-CM | POA: Diagnosis not present

## 2020-01-22 DIAGNOSIS — M5431 Sciatica, right side: Secondary | ICD-10-CM | POA: Diagnosis not present

## 2020-01-25 NOTE — Progress Notes (Signed)
Established patient visit   Patient: Angelica Medina   DOB: 01-Oct-1960   59 y.o. Female  MRN: [id number] Visit Date: 01/26/2020  Today's healthcare provider: Trinna Post, PA-C   Chief Complaint  Patient presents with  . Diabetes  . Hyperlipidemia  . Hypertension  I,Carolyna Yerian M Xoie Kreuser,acting as a scribe for [employer], PA-C.,have documented all relevant documentation on the behalf of Trinna Post, PA-C,as directed by  Trinna Post, PA-C while in the presence of Trinna Post, PA-C.  Subjective    HPI  Diabetes Mellitus Type II, follow-up  Lab Results  Component Value Date   HGBA1C 6.6 (H) 07/31/2019   HGBA1C 6.4 (H) 01/23/2019   HGBA1C 6.2 (A) 05/21/2018   Last seen for diabetes 6 months ago.  Management since then includes continuing the same treatment. She reports good compliance with treatment. She is not having side effects.   Home blood sugar records: fasting range: 100s  Episodes of hypoglycemia? No    Current insulin regiment: none Most Recent Eye Exam: [place]   --------------------------------------------------------------------------------------------------- Hypertension, follow-up  BP Readings from Last 3 Encounters:  01/26/20 122/72  11/25/19 126/71  07/31/19 128/72   Wt Readings from Last 3 Encounters:  01/26/20 194 lb 3.2 oz (88.1 kg)  11/25/19 210 lb (95.3 kg)  07/31/19 237 lb (107.5 kg)     She was last seen for hypertension 6 months ago.  BP at that visit was 128/72. Management since that visit includes continue same treatment. She reports good compliance with treatment. She is not having side effects.  She is exercising. She is adherent to low salt diet.   Outside blood pressures are not being checked.  She does not smoke.  Use of agents associated with hypertension: none.   --------------------------------------------------------------------------------------------------- Lipid/Cholesterol,  follow-up  Last Lipid Panel: Lab Results  Component Value Date   CHOL 142 07/31/2019   LDLCALC 65 07/31/2019   HDL 57 07/31/2019   TRIG 113 07/31/2019    She was last seen for this 6 months ago.  Management since that visit includes continue same treatment.  She reports good compliance with treatment. She is not having side effects.   Symptoms: No appetite changes No foot ulcerations  No chest pain No chest pressure/discomfort  No dyspnea No orthopnea  No fatigue No lower extremity edema  No palpitations No paroxysmal nocturnal dyspnea  No nausea No numbness or tingling of extremity  No polydipsia No polyuria  No speech difficulty No syncope   She is following a Regular diet. Current exercise: housecleaning, walking and yard work  Last metabolic panel Lab Results  Component Value Date   GLUCOSE 108 (H) 07/31/2019   NA 138 07/31/2019   K 4.4 07/31/2019   BUN 12 07/31/2019   CREATININE 0.70 07/31/2019   GFRNONAA 96 07/31/2019   GFRAA 110 07/31/2019   CALCIUM 9.6 07/31/2019   AST 24 07/31/2019   ALT 25 07/31/2019   The ASCVD Risk score (Goff DC Jr., et al., 2013) failed to calculate for the following reasons:   The valid total cholesterol range is 130 to 320 mg/dL  ---------------------------------------------------------------------------------------------------      Medications: Outpatient Medications Prior to Visit  Medication Sig  . atorvastatin (LIPITOR) 20 MG tablet TAKE ONE TABLET BY MOUTH DAILY  . b complex vitamins capsule Take 1 capsule by mouth daily.  . busPIRone (BUSPAR) 15 MG tablet TAKE ONE TABLET BY MOUTH THREE TIMES A DAY (Patient taking differently:  Take 15 mg by mouth 2 (two) times daily. )  . dicyclomine (BENTYL) 10 MG capsule TAKE ONE CAPSULE BY MOUTH FOUR TIMES A DAY - BEFORE MEALS AND AT BEDTIME  . DULoxetine (CYMBALTA) 30 MG capsule Take 1 capsule (30 mg total) by mouth daily.  . metFORMIN (GLUCOPHAGE) 500 MG tablet TAKE ONE TABLET BY  MOUTH TWICE A DAY WITH MEALS  . Multiple Vitamin (MULTIVITAMIN) tablet Take 1 tablet by mouth daily.  . Omega-3 1000 MG CAPS Take 1 tablet by mouth daily.  . traZODone (DESYREL) 50 MG tablet TAKE 2.5 - 3 TABLETS BY MOUTH AT BEDTIME AS NEEDED FOR SLEEP (Patient taking differently: Take 100 mg by mouth at bedtime. )   No facility-administered medications prior to visit.    Review of Systems  Constitutional: Negative.   Respiratory: Negative.   Cardiovascular: Negative.   Neurological: Negative.   Hematological: Negative.       Objective    BP 122/72 (BP Location: Left Arm, Patient Position: Sitting, Cuff Size: Normal)   Pulse 67   Temp (!) 96.6 F (35.9 C) (Temporal)   Wt 194 lb 3.2 oz (88.1 kg)   SpO2 97%   BMI 29.97 kg/m    Physical Exam Constitutional:      Appearance: Normal appearance.  Cardiovascular:     Rate and Rhythm: Normal rate and regular rhythm.     Pulses: Normal pulses.     Heart sounds: Normal heart sounds.  Pulmonary:     Effort: Pulmonary effort is normal.     Breath sounds: Normal breath sounds.  Skin:    General: Skin is warm and dry.  Neurological:     General: No focal deficit present.     Mental Status: She is alert and oriented to person, place, and time.  Psychiatric:        Mood and Affect: Mood normal.        Behavior: Behavior normal.       No results found for any visits on 01/26/20.  Assessment & Plan    1. Type 2 diabetes mellitus with diabetic polyneuropathy, without long-term current use of insulin (HCC)  Well controlled with last A1c 5.7% per patient report of [facility] draw. She has been instructed to upload picture through [place] so we can put it in system.  Continue current medications. If she would like to transition off metformin she can do so and should monitor her fasting blood sugars to make sure they stay < 120. UTD on vaccines, foot exam. Needs to schedule eye exam at [facility]. COVID vaccine declined On  ACEi On Statin Discussed diet and exercise F/u in 6 months   2. Essential hypertension   3. Hyperlipidemia associated with type 2 diabetes mellitus ([place])  Previously well controlled Continue statin Repeat FLP and CMP at next visit Goal LDL < 70         I, Trinna Post, PA-C, have reviewed all documentation for this visit. The documentation on 01/26/20 for the exam, diagnosis, procedures, and orders are all accurate and complete.    Paulene Floor  [facility] [phone] (phone) [phone] (fax)  [place]

## 2020-01-26 ENCOUNTER — Ambulatory Visit: Payer: BC Managed Care – PPO | Admitting: Family Medicine

## 2020-01-26 ENCOUNTER — Encounter: Payer: Self-pay | Admitting: Physician Assistant

## 2020-01-26 ENCOUNTER — Other Ambulatory Visit: Payer: Self-pay

## 2020-01-26 ENCOUNTER — Ambulatory Visit (INDEPENDENT_AMBULATORY_CARE_PROVIDER_SITE_OTHER): Payer: BC Managed Care – PPO | Admitting: Physician Assistant

## 2020-01-26 VITALS — BP 122/72 | HR 67 | Temp 96.6°F | Wt 194.2 lb

## 2020-01-26 DIAGNOSIS — I10 Essential (primary) hypertension: Secondary | ICD-10-CM

## 2020-01-26 DIAGNOSIS — Z23 Encounter for immunization: Secondary | ICD-10-CM | POA: Diagnosis not present

## 2020-01-26 DIAGNOSIS — E1142 Type 2 diabetes mellitus with diabetic polyneuropathy: Secondary | ICD-10-CM

## 2020-01-26 DIAGNOSIS — E1169 Type 2 diabetes mellitus with other specified complication: Secondary | ICD-10-CM | POA: Diagnosis not present

## 2020-01-26 DIAGNOSIS — E785 Hyperlipidemia, unspecified: Secondary | ICD-10-CM

## 2020-01-29 DIAGNOSIS — M5136 Other intervertebral disc degeneration, lumbar region: Secondary | ICD-10-CM | POA: Diagnosis not present

## 2020-01-29 DIAGNOSIS — M5431 Sciatica, right side: Secondary | ICD-10-CM | POA: Diagnosis not present

## 2020-01-29 DIAGNOSIS — M9905 Segmental and somatic dysfunction of pelvic region: Secondary | ICD-10-CM | POA: Diagnosis not present

## 2020-01-29 DIAGNOSIS — M9903 Segmental and somatic dysfunction of lumbar region: Secondary | ICD-10-CM | POA: Diagnosis not present

## 2020-02-01 DIAGNOSIS — E118 Type 2 diabetes mellitus with unspecified complications: Secondary | ICD-10-CM | POA: Diagnosis not present

## 2020-02-09 ENCOUNTER — Other Ambulatory Visit: Payer: Self-pay | Admitting: Family Medicine

## 2020-02-09 NOTE — Telephone Encounter (Signed)
   Notes to clinic:  please verify directions Patient reported taking differently than prescribed    Requested Prescriptions  Pending Prescriptions Disp Refills   busPIRone (BUSPAR) 15 MG tablet [Pharmacy Med Name: BUSPIRONE HCL 15 MG TABLET] 90 tablet 5    Sig: TAKE ONE TABLET BY MOUTH THREE TIMES A DAY      Psychiatry: Anxiolytics/Hypnotics - Non-controlled Passed - 02/09/2020 11:18 AM      Passed - Valid encounter within last 6 months    Recent Outpatient Visits           2 weeks ago Type 2 diabetes mellitus with diabetic polyneuropathy, without long-term current use of insulin [facility])   [place], [place], PA-C   2 months ago Generalized abdominal pain   [facility] [place], Dionne Bucy, MD   6 months ago Encounter for annual physical exam   [facility] [place], Dionne Bucy, MD   1 year ago Mild episode of recurrent major depressive disorder [facility])   [facility] [place], Dionne Bucy, MD   1 year ago [place], Dionne Bucy, MD       Future Appointments             In 5 months Bacigalupo, Dionne Bucy, MD [facility], [place]

## 2020-02-12 DIAGNOSIS — M9903 Segmental and somatic dysfunction of lumbar region: Secondary | ICD-10-CM | POA: Diagnosis not present

## 2020-02-12 DIAGNOSIS — M9905 Segmental and somatic dysfunction of pelvic region: Secondary | ICD-10-CM | POA: Diagnosis not present

## 2020-02-12 DIAGNOSIS — M5431 Sciatica, right side: Secondary | ICD-10-CM | POA: Diagnosis not present

## 2020-02-12 DIAGNOSIS — M5136 Other intervertebral disc degeneration, lumbar region: Secondary | ICD-10-CM | POA: Diagnosis not present

## 2020-02-17 DIAGNOSIS — M9903 Segmental and somatic dysfunction of lumbar region: Secondary | ICD-10-CM | POA: Diagnosis not present

## 2020-02-17 DIAGNOSIS — M5136 Other intervertebral disc degeneration, lumbar region: Secondary | ICD-10-CM | POA: Diagnosis not present

## 2020-02-17 DIAGNOSIS — M9905 Segmental and somatic dysfunction of pelvic region: Secondary | ICD-10-CM | POA: Diagnosis not present

## 2020-02-17 DIAGNOSIS — M5431 Sciatica, right side: Secondary | ICD-10-CM | POA: Diagnosis not present

## 2020-02-24 DIAGNOSIS — M5136 Other intervertebral disc degeneration, lumbar region: Secondary | ICD-10-CM | POA: Diagnosis not present

## 2020-02-24 DIAGNOSIS — M9903 Segmental and somatic dysfunction of lumbar region: Secondary | ICD-10-CM | POA: Diagnosis not present

## 2020-02-24 DIAGNOSIS — M9905 Segmental and somatic dysfunction of pelvic region: Secondary | ICD-10-CM | POA: Diagnosis not present

## 2020-02-24 DIAGNOSIS — M5431 Sciatica, right side: Secondary | ICD-10-CM | POA: Diagnosis not present

## 2020-02-26 ENCOUNTER — Other Ambulatory Visit: Payer: Self-pay | Admitting: Family Medicine

## 2020-02-26 DIAGNOSIS — F331 Major depressive disorder, recurrent, moderate: Secondary | ICD-10-CM

## 2020-03-02 DIAGNOSIS — M9903 Segmental and somatic dysfunction of lumbar region: Secondary | ICD-10-CM | POA: Diagnosis not present

## 2020-03-02 DIAGNOSIS — M5136 Other intervertebral disc degeneration, lumbar region: Secondary | ICD-10-CM | POA: Diagnosis not present

## 2020-03-02 DIAGNOSIS — M9905 Segmental and somatic dysfunction of pelvic region: Secondary | ICD-10-CM | POA: Diagnosis not present

## 2020-03-02 DIAGNOSIS — M5431 Sciatica, right side: Secondary | ICD-10-CM | POA: Diagnosis not present

## 2020-03-11 ENCOUNTER — Ambulatory Visit
Admission: RE | Admit: 2020-03-11 | Discharge: 2020-03-11 | Disposition: A | Discharge status: Home / Self Care | Payer: BC Managed Care – PPO | Source: Ambulatory Visit | Attending: Chiropractor | Admitting: Chiropractor

## 2020-03-11 ENCOUNTER — Ambulatory Visit
Admission: RE | Admit: 2020-03-11 | Discharge: 2020-03-11 | Disposition: A | Discharge status: Home / Self Care | Payer: BC Managed Care – PPO | Attending: Chiropractor | Admitting: Chiropractor

## 2020-03-11 ENCOUNTER — Other Ambulatory Visit: Payer: Self-pay | Admitting: Chiropractor

## 2020-03-11 DIAGNOSIS — M722 Plantar fascial fibromatosis: Secondary | ICD-10-CM | POA: Diagnosis not present

## 2020-03-11 DIAGNOSIS — M7732 Calcaneal spur, left foot: Secondary | ICD-10-CM | POA: Diagnosis not present

## 2020-03-21 DIAGNOSIS — M79672 Pain in left foot: Secondary | ICD-10-CM | POA: Diagnosis not present

## 2020-04-09 ENCOUNTER — Other Ambulatory Visit: Payer: Self-pay | Admitting: Family Medicine

## 2020-04-12 ENCOUNTER — Other Ambulatory Visit (HOSPITAL_COMMUNITY)
Admission: RE | Admit: 2020-04-12 | Discharge: 2020-04-12 | Disposition: A | Discharge status: Home / Self Care | Payer: BC Managed Care – PPO | Source: Ambulatory Visit | Attending: Obstetrics & Gynecology | Admitting: Obstetrics & Gynecology

## 2020-04-12 ENCOUNTER — Encounter: Payer: Self-pay | Admitting: Obstetrics & Gynecology

## 2020-04-12 ENCOUNTER — Other Ambulatory Visit: Payer: Self-pay

## 2020-04-12 ENCOUNTER — Ambulatory Visit (INDEPENDENT_AMBULATORY_CARE_PROVIDER_SITE_OTHER): Payer: BC Managed Care – PPO | Admitting: Obstetrics & Gynecology

## 2020-04-12 VITALS — BP 122/72 | Ht 67.0 in | Wt 197.0 lb

## 2020-04-12 DIAGNOSIS — Z124 Encounter for screening for malignant neoplasm of cervix: Secondary | ICD-10-CM | POA: Insufficient documentation

## 2020-04-12 DIAGNOSIS — Z01419 Encounter for gynecological examination (general) (routine) without abnormal findings: Secondary | ICD-10-CM

## 2020-04-12 DIAGNOSIS — N393 Stress incontinence (female) (male): Secondary | ICD-10-CM

## 2020-04-12 NOTE — Progress Notes (Signed)
HPI: [name] is a 59 y.o. [phone] who LMP was in the past, she presents today for her annual examination.  The patient has no complaints today. The patient is sexually active on occasion and does report some dryness and dyspareunia.  She also reports some LOU w activities and does have some freq and urgency, no nocturia.  Prior NSVD x3.  Menopausal for years now.  Herlast pap: approximate date 2018 and was normal and last mammogram: approximate date 2021 and was normal.  The patient does perform self breast exams.  There is no notable family history of breast or ovarian cancer in her family. The patient is not taking hormone replacement therapy. Patient denies post-menopausal vaginal bleeding.   The patient has regular exercise: yes. The patient denies current symptoms of depression.    PMHx: Past Medical History:  Diagnosis Date  . Anxiety   . Depression   . Hypertension 2016  . Sleep apnea   . T2DM (type 2 diabetes mellitus) ([place]) 2016   diet controlled   Past Surgical History:  Procedure Laterality Date  . [place]   for herniated disc, no hardware  . BREAST BIOPSY Left 2009   benign  . ECTOPIC PREGNANCY SURGERY    . ESOPHAGEAL DILATION  2014   has had several i nthe past for achalasia  . OPEN REDUCTION INTERNAL FIXATION (ORIF) DISTAL RADIAL FRACTURE Left 09/17/2017   Procedure: OPEN REDUCTION INTERNAL FIXATION (ORIF) DISTAL RADIAL FRACTURE;  Surgeon: Lovell Sheehan, MD;  Location: [facility] ORS;  Service: Orthopedics;  Laterality: Left;   Family History  Problem Relation Age of Onset  . Thyroid cancer Mother   . Anxiety disorder Mother   . Depression Mother   . Depression Father   . Diabetes Father   . COPD Father   . Anxiety disorder Father   . Hypothyroidism Sister   . Anxiety disorder Sister   . Healthy Brother   . Anxiety disorder Brother   . Anxiety disorder Brother   . Anxiety disorder Sister   . Depression Sister   . Drug abuse Paternal Aunt   .  Alcohol abuse Maternal Grandfather   . Alcohol abuse Paternal Grandfather   . Alcohol abuse Paternal Grandmother   . Drug abuse Other    Social History   Tobacco Use  . Smoking status: Former Smoker    Packs/day: 0.50    Years: 20.00    Pack years: 10.00    Quit date: 07/01/1994    Years since quitting: 25.8  . Smokeless tobacco: Never Used  Vaping Use  . Vaping Use: Never used  Substance Use Topics  . Alcohol use: No  . Drug use: No    Current Outpatient Medications:  .  atorvastatin (LIPITOR) 20 MG tablet, TAKE ONE TABLET BY MOUTH DAILY, Disp: 90 tablet, Rfl: 3 .  b complex vitamins capsule, Take 1 capsule by mouth daily., Disp: , Rfl:  .  busPIRone (BUSPAR) 15 MG tablet, TAKE ONE TABLET BY MOUTH THREE TIMES A DAY, Disp: 90 tablet, Rfl: 5 .  dicyclomine (BENTYL) 10 MG capsule, TAKE ONE CAPSULE BY MOUTH FOUR TIMES A DAY - BEFORE MEALS AND AT BEDTIME, Disp: 90 capsule, Rfl: 0 .  DULoxetine (CYMBALTA) 30 MG capsule, Take 1 capsule (30 mg total) by mouth daily., Disp: 90 capsule, Rfl: 3 .  metFORMIN (GLUCOPHAGE) 500 MG tablet, TAKE ONE TABLET BY MOUTH TWICE A DAY WITH MEALS, Disp: 180 tablet, Rfl: 0 .  Multiple  Vitamin (MULTIVITAMIN) tablet, Take 1 tablet by mouth daily., Disp: , Rfl:  .  Omega-3 1000 MG CAPS, Take 1 tablet by mouth daily., Disp: , Rfl:  .  traZODone (DESYREL) 50 MG tablet, TAKE 2.5 - 3 TABLETS BY MOUTH EVERY NIGHT AT BEDTIME AS NEEDED FOR SLEEP, Disp: 90 tablet, Rfl: 5 Allergies: Oxycodone and Wellbutrin [bupropion]  Review of Systems  Constitutional: Negative for chills, fever and malaise/fatigue.  HENT: Negative for congestion, sinus pain and sore throat.   Eyes: Negative for blurred vision and pain.  Respiratory: Negative for cough and wheezing.   Cardiovascular: Negative for chest pain and leg swelling.  Gastrointestinal: Negative for abdominal pain, constipation, diarrhea, heartburn, nausea and vomiting.  Genitourinary: Negative for dysuria, frequency,  hematuria and urgency.  Musculoskeletal: Negative for back pain, joint pain, myalgias and neck pain.  Skin: Negative for itching and rash.  Neurological: Negative for dizziness, tremors and weakness.  Endo/Heme/Allergies: Does not bruise/bleed easily.  Psychiatric/Behavioral: Negative for depression. The patient is not nervous/anxious and does not have insomnia.     Objective: BP 122/72   Ht 5\' 7"  (1.702 m)   Wt 197 lb (89.4 kg)   BMI 30.85 kg/m   Filed Weights   04/12/20 0841  Weight: 197 lb (89.4 kg)   Body mass index is 30.85 kg/m. Physical Exam Constitutional:      General: She is not in acute distress.    Appearance: She is well-developed.  Genitourinary:     Pelvic exam was performed with patient supine.     Urethra, bladder, vagina, uterus and rectum normal.     Bladder neck is mobile.     No lesions in the vagina.     No vaginal bleeding.     No cervical motion tenderness, friability, lesion or polyp.     Uterus is mobile.     Uterus is not enlarged.     No uterine mass detected.    Uterus is midaxial.     No right or left adnexal mass present.     Right adnexa not tender.     Left adnexa not tender.     Genitourinary Comments: Uterus well supported. Cystocele Gr 2 No rectocele Mild urethral mobility, q tip test <30 degrees  HENT:     Head: Normocephalic and atraumatic. No laceration.     Right Ear: Hearing normal.     Left Ear: Hearing normal.     Mouth/Throat:     Pharynx: Uvula midline.  Eyes:     Pupils: Pupils are equal, round, and reactive to light.  Neck:     Thyroid: No thyromegaly.  Cardiovascular:     Rate and Rhythm: Normal rate and regular rhythm.     Heart sounds: No murmur heard.  No friction rub. No gallop.   Pulmonary:     Effort: Pulmonary effort is normal. No respiratory distress.     Breath sounds: Normal breath sounds. No wheezing.  Chest:     Breasts:        Right: No mass, skin change or tenderness.        Left: No mass, skin  change or tenderness.  Abdominal:     General: Bowel sounds are normal. There is no distension.     Palpations: Abdomen is soft.     Tenderness: There is no abdominal tenderness. There is no rebound.  Musculoskeletal:        General: Normal range of motion.     Cervical back: Normal  range of motion and neck supple.  Neurological:     Mental Status: She is alert and oriented to person, place, and time.     Cranial Nerves: No cranial nerve deficit.  Skin:    General: Skin is warm and dry.  Psychiatric:        Judgment: Judgment normal.  Vitals reviewed.     Assessment: Annual Exam 1. Women's annual routine gynecological examination   2. Screening for cervical cancer   3. GSI (genuine stress incontinence), female     Plan:            1.  Cervical Screening-  Pap smear done today  2. Breast screening- Exam annually and mammogram scheduled  3. Colonoscopy every 10 years, Hemoccult testing after age 54  4. Labs managed by PCP  5. Counseling for hormonal therapy: none REPLENS discussed as option for Vag Dryness and Dyspareunia              6. GSI - Options for Ant ZRepair and TVT sling discussed, as well as pessary and PT options (prior PT done by pt).  Info gv.    F/U  Return in about 1 year (around 04/12/2021) for Annual.  Barnett Applebaum, MD, [facility], [facility] 04/12/2020  9:24 AM

## 2020-04-12 NOTE — Patient Instructions (Signed)
PAP every three years Mammogram every year Colonoscopy every 10 years Labs yearly (with PCP)  Thank you for choosing [facility]. As part of our ongoing efforts to improve patient experience, we would appreciate your feedback. Please fill out the short survey that you will receive by mail or MyChart. Your opinion is important to [place]! - Dr. Kenton Kingfisher  Replens for vaginal moisturizer therapy recommended twice weekly

## 2020-04-13 LAB — CYTOLOGY - PAP
Comment: NEGATIVE
Diagnosis: NEGATIVE
High risk HPV: NEGATIVE

## 2020-04-15 DIAGNOSIS — H524 Presbyopia: Secondary | ICD-10-CM | POA: Diagnosis not present

## 2020-04-15 DIAGNOSIS — H35372 Puckering of macula, left eye: Secondary | ICD-10-CM | POA: Diagnosis not present

## 2020-04-15 DIAGNOSIS — H5213 Myopia, bilateral: Secondary | ICD-10-CM | POA: Diagnosis not present

## 2020-04-15 DIAGNOSIS — E119 Type 2 diabetes mellitus without complications: Secondary | ICD-10-CM | POA: Diagnosis not present

## 2020-04-15 DIAGNOSIS — H52213 Irregular astigmatism, bilateral: Secondary | ICD-10-CM | POA: Diagnosis not present

## 2020-04-15 LAB — HM DIABETES EYE EXAM

## 2020-06-16 ENCOUNTER — Encounter: Payer: Self-pay | Admitting: Family Medicine

## 2020-06-23 IMAGING — MG DIGITAL SCREENING BILATERAL MAMMOGRAM WITH TOMO AND CAD
8 series · 8 of 24 positions shown · non-contrast
Comparison: Previous exam(s).

CLINICAL DATA: Screening.

EXAM:
DIGITAL SCREENING BILATERAL MAMMOGRAM WITH TOMO AND CAD

[R MLO synth-2D]
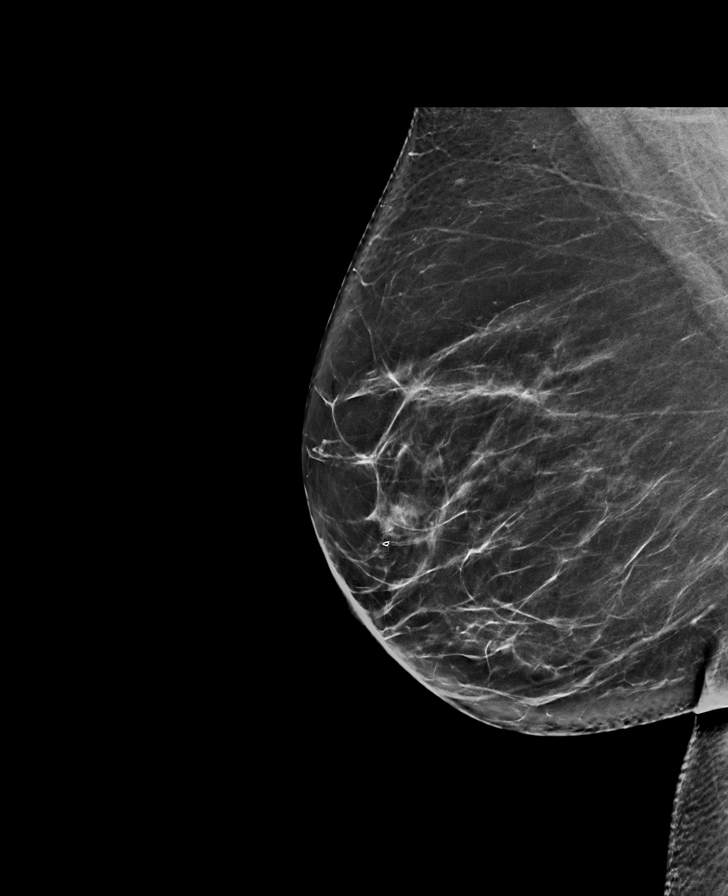

[R CC synth-2D]
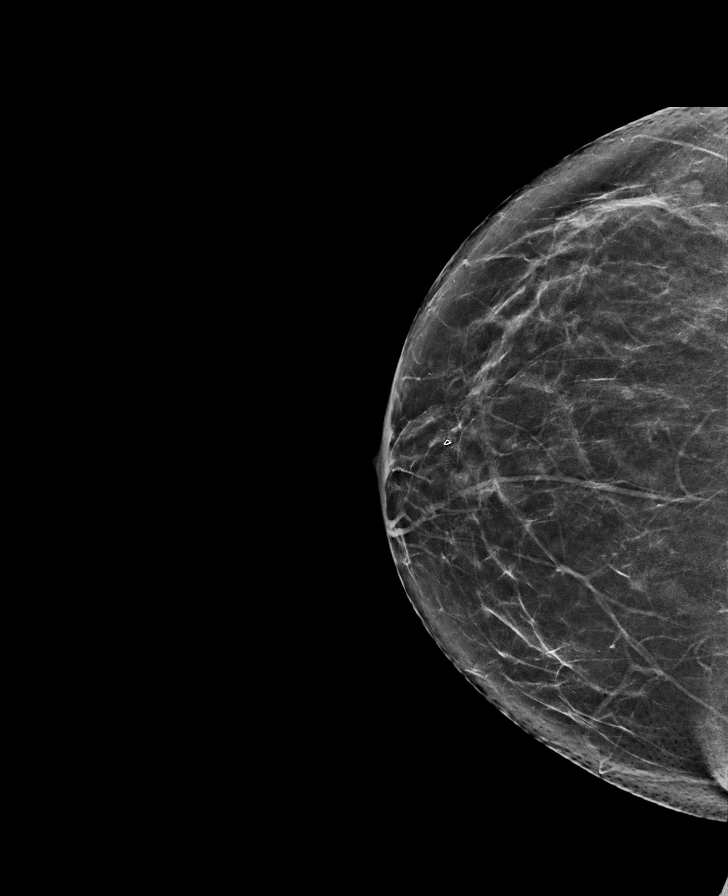

[L CC synth-2D]
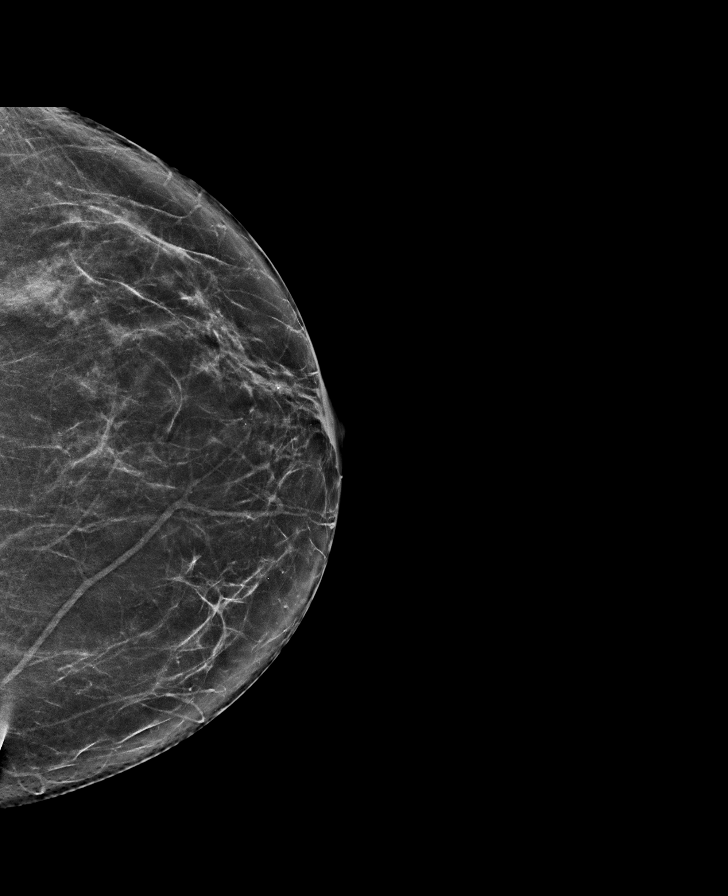

[L MLO synth-2D]
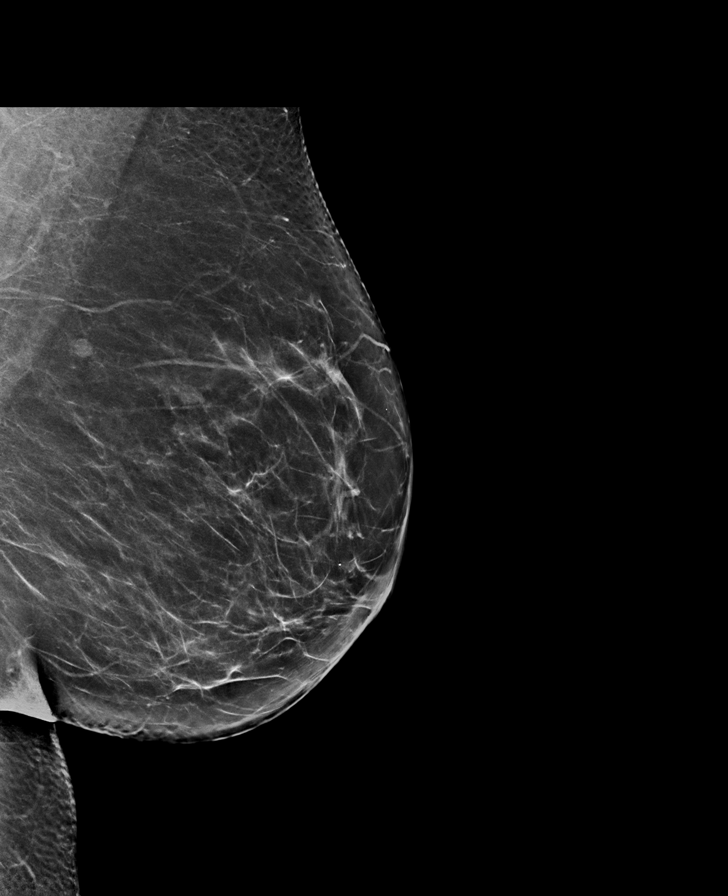

[L MLO tomo · tomo slice 41/82.0]
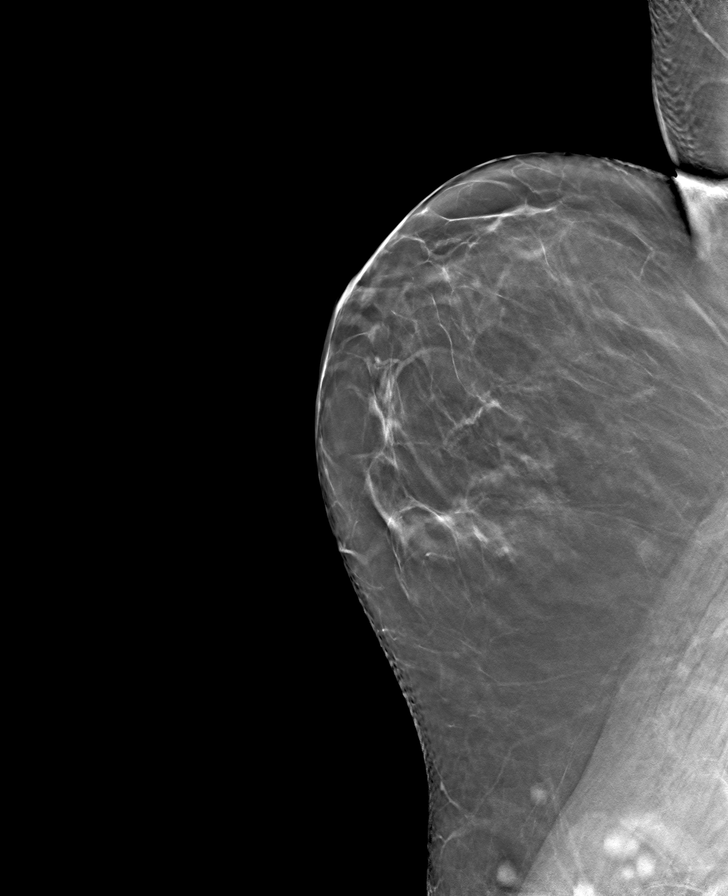

[R MLO tomo · tomo slice 41/80.0]
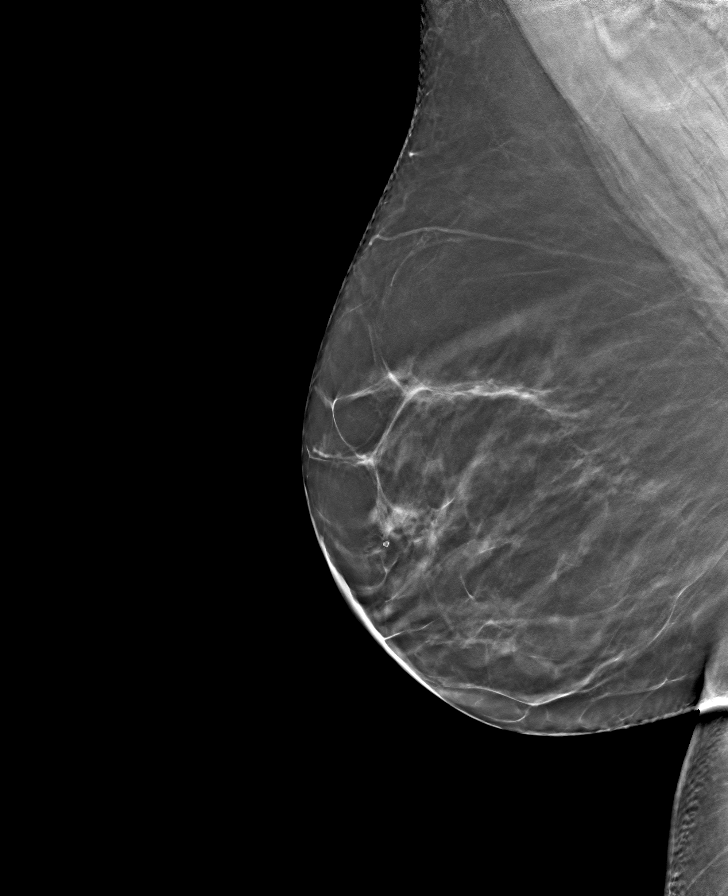

[L CC tomo · tomo slice 35/68.0]
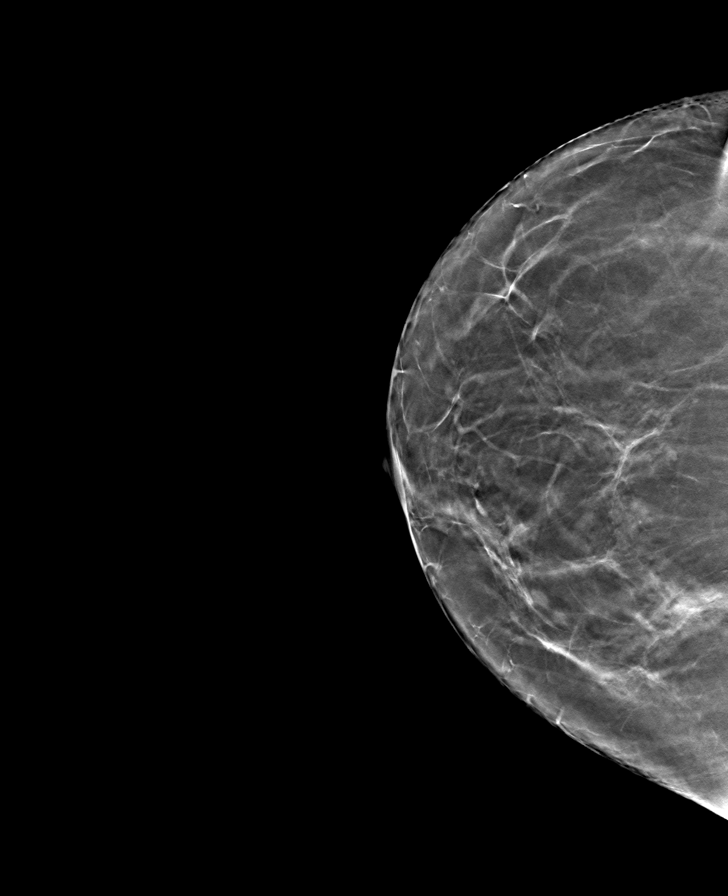

[R CC tomo · tomo slice 35/70.0]
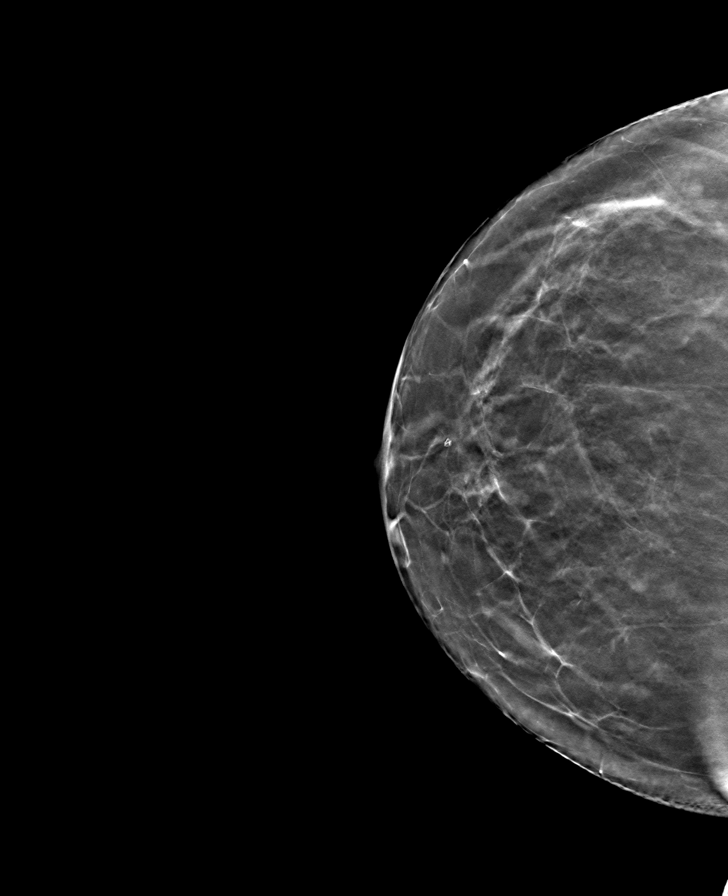

[8 of 24 positions shown; findings below may reference images not displayed]

ACR Breast Density Category b: There are scattered areas of
fibroglandular density.
FINDINGS: There are no findings suspicious for malignancy. Images were
processed with CAD.
IMPRESSION: No mammographic evidence of malignancy. A result letter of this
screening mammogram will be mailed directly to the patient.

RECOMMENDATION:
Screening mammogram in one year. (Code:[id number]-775)

BI-RADS CATEGORY  1: Negative.

## 2020-06-27 ENCOUNTER — Telehealth: Payer: Self-pay

## 2020-06-27 DIAGNOSIS — M25532 Pain in left wrist: Secondary | ICD-10-CM | POA: Diagnosis not present

## 2020-06-27 DIAGNOSIS — S63502A Unspecified sprain of left wrist, initial encounter: Secondary | ICD-10-CM | POA: Diagnosis not present

## 2020-06-27 NOTE — Telephone Encounter (Signed)
Refaxed ROI to [phone].

## 2020-06-27 NOTE — Telephone Encounter (Signed)
Copied from CRM [id number]. Topic: General - Other >> Jun 27, 2020 10:41 AM Elliot Gault wrote: Patient would like to know if PCP has the following imaging report of left forearm from imaging from [facility] MD  [address] Skeeters [place], [place] [address] [phone]. Patient states records were sent over 4 years ago. Please note if received;l patient would like report sent to [facility]

## 2020-07-14 ENCOUNTER — Ambulatory Visit: Payer: BC Managed Care – PPO | Admitting: Dermatology

## 2020-07-19 ENCOUNTER — Other Ambulatory Visit: Payer: Self-pay | Admitting: Family Medicine

## 2020-07-19 DIAGNOSIS — E7800 Pure hypercholesterolemia, unspecified: Secondary | ICD-10-CM

## 2020-07-21 ENCOUNTER — Other Ambulatory Visit: Payer: Self-pay | Admitting: Family Medicine

## 2020-08-02 ENCOUNTER — Encounter: Payer: BC Managed Care – PPO | Admitting: Family Medicine

## 2020-08-18 ENCOUNTER — Telehealth (INDEPENDENT_AMBULATORY_CARE_PROVIDER_SITE_OTHER): Payer: BC Managed Care – PPO | Admitting: Family Medicine

## 2020-08-18 DIAGNOSIS — J0110 Acute frontal sinusitis, unspecified: Secondary | ICD-10-CM

## 2020-08-18 MED ORDER — FLUTICASONE PROPIONATE 50 MCG/ACT NA SUSP: 2.0000 | Freq: Every day | NASAL | 6 refills | Status: DC

## 2020-08-18 MED ORDER — AMOXICILLIN-POT CLAVULANATE 875-125 MG PO TABS: 1.0000 | ORAL_TABLET | Freq: Two times a day (BID) | ORAL | 0 refills | Status: AC

## 2020-08-18 NOTE — Progress Notes (Signed)
MyChart Video Visit    Virtual Visit via Video Note   This visit type was conducted due to national recommendations for restrictions regarding the COVID-19 Pandemic (e.g. social distancing) in an effort to limit this patient's exposure and mitigate transmission in our community. This patient is at least at moderate risk for complications without adequate follow up. This format is felt to be most appropriate for this patient at this time. Physical exam was limited by quality of the video and audio technology used for the visit.    Patient location: home Provider location: [place] involved in the visit: patient, provider  I discussed the limitations of evaluation and management by telemedicine and the availability of in person appointments. The patient expressed understanding and agreed to proceed.  Patient: Angelica Medina   DOB: Nov 10, 1960   60 y.o. Female  MRN: [id number] Visit Date: 08/18/2020  Today's healthcare provider: Lavon Paganini, MD   Chief Complaint  Patient presents with  . Headache   Subjective    HPI HPI    Headache    Location Descriptor In the frontal area.  Characterized as stabbing.  Pain was noted as 7/10.  This started weeks.  Lasting for weeks.  Occurs constant.  Timing: Constant .  Triggers: Headache stays the same constantly.  History of migraines.  Since onset it is stable.  Treatments attempted include no treatment.  Response to treatment was no improvement.  Associated symptoms include eye pain, redness, headache and neck pain.  Negative for blurred vision, vision loss, double vision, fever, chills and fatigue.       Last edited by Sylvester Harder, [place] on 08/18/2020  9:26 AM. (History)      Pt has ongoing, constant headache that has been present since January which she believed to have started after an URI (Likely COVID). Worse in the morning.  Tried Ibuprofen initially.  +Sinus pressure, postnasal drip No  fever  Social History   Tobacco Use  . Smoking status: Former Smoker    Packs/day: 0.50    Years: 20.00    Pack years: 10.00    Quit date: 07/01/1994    Years since quitting: 26.1  . Smokeless tobacco: Never Used  Vaping Use  . Vaping Use: Never used  Substance Use Topics  . Alcohol use: No  . Drug use: No      Medications: Outpatient Medications Prior to Visit  Medication Sig  . atorvastatin (LIPITOR) 20 MG tablet TAKE ONE TABLET BY MOUTH DAILY  . b complex vitamins capsule Take 1 capsule by mouth daily.  . busPIRone (BUSPAR) 15 MG tablet TAKE ONE TABLET BY MOUTH THREE TIMES A DAY  . dicyclomine (BENTYL) 10 MG capsule TAKE ONE CAPSULE BY MOUTH FOUR TIMES A DAY - BEFORE MEALS AND AT BEDTIME  . DULoxetine (CYMBALTA) 30 MG capsule Take 1 capsule (30 mg total) by mouth daily.  . metFORMIN (GLUCOPHAGE) 500 MG tablet TAKE ONE TABLET BY MOUTH TWICE A DAY WITH MEALS  . Multiple Vitamin (MULTIVITAMIN) tablet Take 1 tablet by mouth daily.  . Omega-3 1000 MG CAPS Take 1 tablet by mouth daily.  . traZODone (DESYREL) 50 MG tablet TAKE 2.5 - 3 TABLETS BY MOUTH EVERY NIGHT AT BEDTIME AS NEEDED FOR SLEEP   No facility-administered medications prior to visit.    Review of Systems  HENT: Positive for postnasal drip.   Eyes: Positive for pain. Negative for redness and visual disturbance.       Eyes are  burning       Objective    There were no vitals taken for this visit.   Physical Exam Constitutional:      General: She is not in acute distress.    Appearance: She is well-developed.  HENT:     Head: Normocephalic.  Pulmonary:     Effort: Pulmonary effort is normal. No respiratory distress.  Neurological:     Mental Status: She is alert. Mental status is at baseline.  Psychiatric:        Mood and Affect: Mood normal.        Behavior: Behavior normal.        Assessment & Plan     1. Acute non-recurrent frontal sinusitis - symptoms and exam c/w sinusitis  (including  headaches) - no evidence of AOM, CAP, strep pharyngitis, or other infection - given duration of symptoms, suspect bacterial etiology - will treat with Augmentin x7d - discussed symptomatic management (flonase, decongestants, etc), natural course, and return precautions    Return in about 2 weeks (around 09/01/2020) for chronic disease f/u.     I discussed the assessment and treatment plan with the patient. The patient was provided an opportunity to ask questions and all were answered. The patient agreed with the plan and demonstrated an understanding of the instructions.   The patient was advised to call back or seek an in-person evaluation if the symptoms worsen or if the condition fails to improve as anticipated.  I, Lavon Paganini, MD, have reviewed all documentation for this visit. The documentation on 08/18/20 for the exam, diagnosis, procedures, and orders are all accurate and complete.   Sohana Austell, Dionne Bucy, MD, MPH [facility]

## 2020-08-18 NOTE — Patient Instructions (Signed)
Sinusitis, Adult Sinusitis is inflammation of your sinuses. Sinuses are hollow spaces in the bones around your face. Your sinuses are located:  Around your eyes.  In the middle of your forehead.  Behind your nose.  In your cheekbones. Mucus normally drains out of your sinuses. When your nasal tissues become inflamed or swollen, mucus can become trapped or blocked. This allows bacteria, viruses, and fungi to grow, which leads to infection. Most infections of the sinuses are caused by a virus. Sinusitis can develop quickly. It can last for up to 4 weeks (acute) or for more than 12 weeks (chronic). Sinusitis often develops after a cold. What are the causes? This condition is caused by anything that creates swelling in the sinuses or stops mucus from draining. This includes:  Allergies.  Asthma.  Infection from bacteria or viruses.  Deformities or blockages in your nose or sinuses.  Abnormal growths in the nose (nasal polyps).  Pollutants, such as chemicals or irritants in the air.  Infection from fungi (rare). What increases the risk? You are more likely to develop this condition if you:  Have a weak body defense system (immune system).  Do a lot of swimming or diving.  Overuse nasal sprays.  Smoke. What are the signs or symptoms? The main symptoms of this condition are pain and a feeling of pressure around the affected sinuses. Other symptoms include:  Stuffy nose or congestion.  Thick drainage from your nose.  Swelling and warmth over the affected sinuses.  Headache.  Upper toothache.  A cough that may get worse at night.  Extra mucus that collects in the throat or the back of the nose (postnasal drip).  Decreased sense of smell and taste.  Fatigue.  A fever.  Sore throat.  Bad breath. How is this diagnosed? This condition is diagnosed based on:  Your symptoms.  Your medical history.  A physical exam.  Tests to find out if your condition is  acute or chronic. This may include: ? Checking your nose for nasal polyps. ? Viewing your sinuses using a device that has a light (endoscope). ? Testing for allergies or bacteria. ? Imaging tests, such as an MRI or CT scan. In rare cases, a bone biopsy may be done to rule out more serious types of fungal sinus disease. How is this treated? Treatment for sinusitis depends on the cause and whether your condition is chronic or acute.  If caused by a virus, your symptoms should go away on their own within 10 days. You may be given medicines to relieve symptoms. They include: ? Medicines that shrink swollen nasal passages (topical intranasal decongestants). ? Medicines that treat allergies (antihistamines). ? A spray that eases inflammation of the nostrils (topical intranasal corticosteroids). ? Rinses that help get rid of thick mucus in your nose (nasal saline washes).  If caused by bacteria, your health care provider may recommend waiting to see if your symptoms improve. Most bacterial infections will get better without antibiotic medicine. You may be given antibiotics if you have: ? A severe infection. ? A weak immune system.  If caused by narrow nasal passages or nasal polyps, you may need to have surgery. Follow these instructions at home: Medicines  Take, use, or apply over-the-counter and prescription medicines only as told by your health care provider. These may include nasal sprays.  If you were prescribed an antibiotic medicine, take it as told by your health care provider. Do not stop taking the antibiotic even if you start   to feel better. Hydrate and humidify  Drink enough fluid to keep your urine pale yellow. Staying hydrated will help to thin your mucus.  Use a cool mist humidifier to keep the humidity level in your home above 50%.  Inhale steam for 10-15 minutes, 3-4 times a day, or as told by your health care provider. You can do this in the bathroom while a hot shower is  running.  Limit your exposure to cool or dry air.   Rest  Rest as much as possible.  Sleep with your head raised (elevated).  Make sure you get enough sleep each night. General instructions  Apply a warm, moist washcloth to your face 3-4 times a day or as told by your health care provider. This will help with discomfort.  Wash your hands often with soap and water to reduce your exposure to germs. If soap and water are not available, use hand sanitizer.  Do not smoke. Avoid being around people who are smoking (secondhand smoke).  Keep all follow-up visits as told by your health care provider. This is important.   Contact a health care provider if:  You have a fever.  Your symptoms get worse.  Your symptoms do not improve within 10 days. Get help right away if:  You have a severe headache.  You have persistent vomiting.  You have severe pain or swelling around your face or eyes.  You have vision problems.  You develop confusion.  Your neck is stiff.  You have trouble breathing. Summary  Sinusitis is soreness and inflammation of your sinuses. Sinuses are hollow spaces in the bones around your face.  This condition is caused by nasal tissues that become inflamed or swollen. The swelling traps or blocks the flow of mucus. This allows bacteria, viruses, and fungi to grow, which leads to infection.  If you were prescribed an antibiotic medicine, take it as told by your health care provider. Do not stop taking the antibiotic even if you start to feel better.  Keep all follow-up visits as told by your health care provider. This is important. This information is not intended to replace advice given to you by your health care provider. Make sure you discuss any questions you have with your health care provider. Document Revised: 11/18/2017 Document Reviewed: 11/18/2017 Elsevier Patient Education  2021 Elsevier Inc.  

## 2020-08-22 ENCOUNTER — Telehealth: Payer: Self-pay | Admitting: Family Medicine

## 2020-08-22 ENCOUNTER — Other Ambulatory Visit: Payer: Self-pay | Admitting: Family Medicine

## 2020-08-22 DIAGNOSIS — Z1211 Encounter for screening for malignant neoplasm of colon: Secondary | ICD-10-CM

## 2020-08-22 DIAGNOSIS — Z1231 Encounter for screening mammogram for malignant neoplasm of breast: Secondary | ICD-10-CM

## 2020-08-22 NOTE — Telephone Encounter (Signed)
She wants to go with Dr. Wilhemena Durie at [facility] for the colonoscopy.

## 2020-08-22 NOTE — Telephone Encounter (Signed)
FYI

## 2020-08-22 NOTE — Telephone Encounter (Signed)
Pt is calling to request a referral to [facility] For a colonoscopy. Please advise CB- [phone]

## 2020-08-22 NOTE — Telephone Encounter (Signed)
Ok to send referral.  Not sure if she means [facility] or [facility].  She is due for colon cancer screening this year.

## 2020-08-22 NOTE — Telephone Encounter (Signed)
Pt called saying she wants to go to

## 2020-08-31 DIAGNOSIS — E118 Type 2 diabetes mellitus with unspecified complications: Secondary | ICD-10-CM | POA: Diagnosis not present

## 2020-09-01 ENCOUNTER — Ambulatory Visit: Payer: BC Managed Care – PPO | Admitting: Family Medicine

## 2020-09-02 ENCOUNTER — Other Ambulatory Visit: Payer: Self-pay

## 2020-09-02 ENCOUNTER — Telehealth (INDEPENDENT_AMBULATORY_CARE_PROVIDER_SITE_OTHER): Payer: Self-pay | Admitting: Gastroenterology

## 2020-09-02 DIAGNOSIS — Z1211 Encounter for screening for malignant neoplasm of colon: Secondary | ICD-10-CM

## 2020-09-02 MED ORDER — GOLYTELY 236 G PO SOLR: 4000.0000 mL | Freq: Once | ORAL | 0 refills | Status: AC

## 2020-09-02 NOTE — Progress Notes (Signed)
Gastroenterology Pre-Procedure Review  Request Date: Monday 09/26/20 Requesting Physician: Dr. Vicente Males  PATIENT REVIEW QUESTIONS: The patient responded to the following health history questions as indicated:    1. Are you having any GI issues? no 2. Do you have a personal history of Polyps? no 3. Do you have a family history of Colon Cancer or Polyps? no 4. Diabetes Mellitus? yes (type 2) 5. Joint replacements in the past 12 months?no 6. Major health problems in the past 3 months?no 7. Any artificial heart valves, MVP, or defibrillator?no    MEDICATIONS & ALLERGIES:    Patient reports the following regarding taking any anticoagulation/antiplatelet therapy:   Plavix, Coumadin, Eliquis, Xarelto, Lovenox, Pradaxa, Brilinta, or Effient? no Aspirin? no  Patient confirms/reports the following medications:  Current Outpatient Medications  Medication Sig Dispense Refill  . atorvastatin (LIPITOR) 20 MG tablet TAKE ONE TABLET BY MOUTH DAILY 90 tablet 1  . b complex vitamins capsule Take 1 capsule by mouth daily.    . busPIRone (BUSPAR) 15 MG tablet TAKE ONE TABLET BY MOUTH THREE TIMES A DAY 90 tablet 5  . dicyclomine (BENTYL) 10 MG capsule TAKE ONE CAPSULE BY MOUTH FOUR TIMES A DAY - BEFORE MEALS AND AT BEDTIME 90 capsule 0  . DULoxetine (CYMBALTA) 30 MG capsule Take 1 capsule (30 mg total) by mouth daily. 90 capsule 3  . fluticasone (FLONASE) 50 MCG/ACT nasal spray Place 2 sprays into both nostrils daily. 16 g 6  . metFORMIN (GLUCOPHAGE) 500 MG tablet TAKE ONE TABLET BY MOUTH TWICE A DAY WITH MEALS 180 tablet 0  . Multiple Vitamin (MULTIVITAMIN) tablet Take 1 tablet by mouth daily.    . Omega-3 1000 MG CAPS Take 1 tablet by mouth daily.    . polyethylene glycol (GOLYTELY) 236 g solution Take 4,000 mLs by mouth once for 1 dose. Fill container to the fill line with clear liquid of choice.  Mix well.  Drink 8 oz every 30 minutes until entire contents have been completed. 4000 mL 0   No current  facility-administered medications for this visit.    Patient confirms/reports the following allergies:  Allergies  Allergen Reactions  . Bupropion Anxiety and Other (See Comments)  . Oxycodone Itching    Orders Placed This Encounter  Procedures  . Procedural/ Surgical Case Request: COLONOSCOPY WITH PROPOFOL    Standing Status:   Standing    Number of Occurrences:   1    Order Specific Question:   Pre-op diagnosis    Answer:   screening colonoscopy    Order Specific Question:   CPT Code    Answer:   94765    AUTHORIZATION INFORMATION Primary Insurance: 1D#: Group #:  Secondary Insurance: 1D#: Group #:  SCHEDULE INFORMATION: Date: 09/26/20 Time: Location:[facility]

## 2020-09-06 ENCOUNTER — Encounter: Payer: Self-pay | Admitting: Family Medicine

## 2020-09-06 ENCOUNTER — Ambulatory Visit (INDEPENDENT_AMBULATORY_CARE_PROVIDER_SITE_OTHER): Payer: BC Managed Care – PPO | Admitting: Family Medicine

## 2020-09-06 ENCOUNTER — Other Ambulatory Visit: Payer: Self-pay

## 2020-09-06 VITALS — BP 133/69 | HR 72 | Temp 97.9°F | Wt 196.0 lb

## 2020-09-06 DIAGNOSIS — F3341 Major depressive disorder, recurrent, in partial remission: Secondary | ICD-10-CM

## 2020-09-06 DIAGNOSIS — E1169 Type 2 diabetes mellitus with other specified complication: Secondary | ICD-10-CM

## 2020-09-06 DIAGNOSIS — G4733 Obstructive sleep apnea (adult) (pediatric): Secondary | ICD-10-CM

## 2020-09-06 DIAGNOSIS — Z6830 Body mass index (BMI) 30.0-30.9, adult: Secondary | ICD-10-CM

## 2020-09-06 DIAGNOSIS — E1159 Type 2 diabetes mellitus with other circulatory complications: Secondary | ICD-10-CM

## 2020-09-06 DIAGNOSIS — E1142 Type 2 diabetes mellitus with diabetic polyneuropathy: Secondary | ICD-10-CM

## 2020-09-06 DIAGNOSIS — E669 Obesity, unspecified: Secondary | ICD-10-CM | POA: Diagnosis not present

## 2020-09-06 DIAGNOSIS — Z2821 Immunization not carried out because of patient refusal: Secondary | ICD-10-CM | POA: Insufficient documentation

## 2020-09-06 DIAGNOSIS — I152 Hypertension secondary to endocrine disorders: Secondary | ICD-10-CM | POA: Diagnosis not present

## 2020-09-06 DIAGNOSIS — E785 Hyperlipidemia, unspecified: Secondary | ICD-10-CM

## 2020-09-06 NOTE — Progress Notes (Signed)
Established patient visit   Patient: Angelica Medina   DOB: 1961-03-03   60 y.o. Female  MRN: [id number] Visit Date: 09/06/2020  Today's healthcare provider: Lavon Paganini, MD   Chief Complaint  Patient presents with   Hyperlipidemia   Hypertension   Diabetes   Headache   Subjective    Headache  This is a new problem. The current episode started more than 1 month ago (Started about 8 weeks ago. ). The problem has been waxing and waning (Seems worse in the mornings.). The pain is located in the frontal region. The pain does not radiate. The pain quality is not similar to prior headaches. The quality of the pain is described as aching. Associated symptoms include rhinorrhea, sinus pressure and a visual change. Pertinent negatives include no blurred vision, dizziness, hearing loss, nausea, neck pain, numbness, phonophobia, photophobia, swollen glands, tingling, tinnitus or vomiting. She has tried acetaminophen (Antibiotic, Flonase,) for the symptoms. The treatment provided no relief.    Diabetes Mellitus Type II, follow-up  Lab Results  Component Value Date   HGBA1C 5.9 01/06/2020   HGBA1C 6.6 (H) 07/31/2019   HGBA1C 6.4 (H) 01/23/2019   Last seen for diabetes 6 months ago.  Management since then includes continuing the same treatment.  Pt states her last A1C was 06/2020 and it was 6.2.  She reports excellent compliance with treatment. She is not having side effects.   Home blood sugar records: fasting range: 99-102  Episodes of hypoglycemia? No    Current insulin regiment: None Most Recent Eye Exam: UTD  --------------------------------------------------------------------------------------------------- Hypertension, follow-up  BP Readings from Last 3 Encounters:  09/06/20 133/69  04/12/20 122/72  01/26/20 122/72   Wt Readings from Last 3 Encounters:  09/06/20 196 lb (88.9 kg)  04/12/20 197 lb (89.4 kg)  01/26/20 194 lb 3.2 oz (88.1 kg)     She was last  seen for hypertension 6 months ago.  Management since that visit includes no changes. She reports excellent compliance with treatment. She is not having side effects.  She is exercising. She is adherent to low salt diet.   Outside blood pressures are checked occasionally.  She does not smoke.  Use of agents associated with hypertension: none.   --------------------------------------------------------------------------------------------------- Lipid/Cholesterol, follow-up  Last Lipid Panel: Lab Results  Component Value Date   CHOL 121 01/06/2020   LDLCALC 50 01/06/2020   HDL 50 01/06/2020   TRIG 57 01/06/2020    She was last seen for this 6 months ago.  Management since that visit includes no changes.  She reports excellent compliance with treatment. She is not having side effects.   Symptoms: No appetite changes No foot ulcerations  No chest pain No chest pressure/discomfort  No dyspnea No orthopnea  No fatigue No lower extremity edema  No palpitations No paroxysmal nocturnal dyspnea  No nausea No numbness or tingling of extremity  No polydipsia No polyuria  No speech difficulty No syncope     Last metabolic panel Lab Results  Component Value Date   GLUCOSE 108 (H) 07/31/2019   NA 140 01/06/2020   K 4.5 01/06/2020   BUN 12 01/06/2020   CREATININE 0.7 01/06/2020   GFRNONAA 89 01/06/2020   GFRAA 103 01/06/2020   CALCIUM 9.5 01/06/2020   AST 16 01/06/2020   ALT 13 01/06/2020   The 10-year ASCVD risk score Mikey Bussing DC Jr., et al., 2013) is: 5.4%  --------------------------------------------------------------------------------------------------- MDD: Failed cymbalta, abilify, rexulti, buspar, celexa, lexapro, prozac, zoloft, effexor  Patient Active Problem List   Diagnosis Date Noted   COVID-19 vaccination declined 09/06/2020   Pain of left heel 03/21/2020   Fatigue 01/23/2019   Lipoma of left upper extremity 11/18/2017   Hyperlipidemia associated with  type 2 diabetes mellitus ([place]) 06/28/2017   Diabetic neuropathy ([place]) 06/28/2017   OSA (obstructive sleep apnea) 02/28/2017   Obesity 02/28/2017   Thyroid nodule 02/28/2017   Depression    Anxiety    T2DM (type 2 diabetes mellitus) ([place]) 07/02/2014   Hypertension associated with diabetes ([place]) 07/02/2014   Past Medical History:  Diagnosis Date   Anxiety    Depression    Hypertension 2016   Sleep apnea    T2DM (type 2 diabetes mellitus) ([place]) 2016   diet controlled   Social History   Tobacco Use   Smoking status: Former Smoker    Packs/day: 0.50    Years: 20.00    Pack years: 10.00    Quit date: 07/01/1994    Years since quitting: 26.2   Smokeless tobacco: Never Used  Vaping Use   Vaping Use: Never used  Substance Use Topics   Alcohol use: No   Drug use: No   Allergies  Allergen Reactions   Bupropion Anxiety and Other (See Comments)   Oxycodone Itching     Medications: Outpatient Medications Prior to Visit  Medication Sig   atorvastatin (LIPITOR) 20 MG tablet TAKE ONE TABLET BY MOUTH DAILY   b complex vitamins capsule Take 1 capsule by mouth daily.   DULoxetine (CYMBALTA) 30 MG capsule Take 1 capsule (30 mg total) by mouth daily.   fluticasone (FLONASE) 50 MCG/ACT nasal spray Place 2 sprays into both nostrils daily.   metFORMIN (GLUCOPHAGE) 500 MG tablet Take 500 mg by mouth daily with breakfast.   Multiple Vitamin (MULTIVITAMIN) tablet Take 1 tablet by mouth daily.   Omega-3 1000 MG CAPS Take 1 tablet by mouth daily.   [DISCONTINUED] metFORMIN (GLUCOPHAGE) 500 MG tablet TAKE ONE TABLET BY MOUTH TWICE A DAY WITH MEALS   [DISCONTINUED] busPIRone (BUSPAR) 15 MG tablet TAKE ONE TABLET BY MOUTH THREE TIMES A DAY   [DISCONTINUED] dicyclomine (BENTYL) 10 MG capsule TAKE ONE CAPSULE BY MOUTH FOUR TIMES A DAY - BEFORE MEALS AND AT BEDTIME   No facility-administered medications prior to visit.    Review of Systems  Constitutional:  Negative.   HENT: Positive for rhinorrhea and sinus pressure. Negative for hearing loss and tinnitus.   Eyes: Negative for blurred vision and photophobia.  Respiratory: Negative.   Gastrointestinal: Negative.  Negative for nausea and vomiting.  Musculoskeletal: Negative for neck pain.  Neurological: Positive for headaches. Negative for dizziness, tingling, light-headedness and numbness.        Objective    BP 133/69 (BP Location: Left Arm, Patient Position: Sitting, Cuff Size: Large)    Pulse 72    Temp 97.9 F (36.6 C) (Oral)    Wt 196 lb (88.9 kg)    SpO2 98%    BMI 30.70 kg/m    Physical Exam Vitals reviewed.  Constitutional:      General: She is not in acute distress.    Appearance: Normal appearance. She is well-developed. She is not diaphoretic.  HENT:     Head: Normocephalic and atraumatic.  Eyes:     General: No scleral icterus.    Conjunctiva/sclera: Conjunctivae normal.  Neck:     Thyroid: No thyromegaly.  Cardiovascular:     Rate and Rhythm: Normal rate and regular rhythm.  Pulses: Normal pulses.     Heart sounds: Normal heart sounds. No murmur heard.   Pulmonary:     Effort: Pulmonary effort is normal. No respiratory distress.     Breath sounds: Normal breath sounds. No wheezing, rhonchi or rales.  Musculoskeletal:     Cervical back: Neck supple.     Right lower leg: No edema.     Left lower leg: No edema.  Lymphadenopathy:     Cervical: No cervical adenopathy.  Skin:    General: Skin is warm and dry.     Findings: No rash.  Neurological:     Mental Status: She is alert and oriented to person, place, and time. Mental status is at baseline.  Psychiatric:        Mood and Affect: Mood normal.        Behavior: Behavior normal.       No results found for any visits on 09/06/20.  Assessment & Plan     Problem List Items Addressed This Visit      Cardiovascular and Mediastinum   Hypertension associated with diabetes ([place])    Well-controlled  with diet and exercise Not on medications Recheck metabolic panel Follow-up in 6 months      Relevant Medications   metFORMIN (GLUCOPHAGE) 500 MG tablet   Other Relevant Orders   Basic Metabolic Panel (BMET)     Respiratory   OSA (obstructive sleep apnea)    Patient CPAP has been recalled and she has not been using it for 2 months I suspect this is what her morning headaches are related to She will continue to check with servicer about new CPAP and may consider cleaning and using the 1 that she has until she is able to get a new one        Endocrine   T2DM (type 2 diabetes mellitus) ([place]) - Primary    Well-controlled on last A1c of 6.2 Patient will send the results to [place] so we are able to abstract them Associated with/complicated by HTN, HLD, OSA Continue current medications Up-to-date on vaccinations and screenings ROI sent for last eye exam On statin Discussed diet and exercise Follow-up in 6 months      Relevant Medications   metFORMIN (GLUCOPHAGE) 500 MG tablet   Hyperlipidemia associated with type 2 diabetes mellitus ([place])    Previously well controlled with goal LDL less than 70 Continue Lipitor at current dose Recheck CMP and FLP at CPE      Relevant Medications   metFORMIN (GLUCOPHAGE) 500 MG tablet     Other   Depression    Chronic and fairly well controlled Tired of taking different medications as she finds that her emotions are muted on these and wants to be back to a full range of emotions like she used to have She has done genetic swab for medications previously with psychiatry She has tried and failed Abilify, Rexulti, BuSpar, Celexa, Lexapro, Prozac, Zoloft, Effexor She is not getting great control from Cymbalta at the moment, but we will continue She will consider [place] and let me know if she wants a referral to try this [facility] therapy also      Obesity    Congratulated on weight loss Discussed importance of healthy weight management Discussed  diet and exercise       Relevant Medications   metFORMIN (GLUCOPHAGE) 500 MG tablet   COVID-19 vaccination declined       Return in about 6 months (around 03/09/2021) for CPE.  I, Lavon Paganini, MD, have reviewed all documentation for this visit. The documentation on 09/06/20 for the exam, diagnosis, procedures, and orders are all accurate and complete.   Cline Draheim, Dionne Bucy, MD, MPH [facility]

## 2020-09-06 NOTE — Assessment & Plan Note (Signed)
Chronic and fairly well controlled Tired of taking different medications as she finds that her emotions are muted on these and wants to be back to a full range of emotions like she used to have She has done genetic swab for medications previously with psychiatry She has tried and failed Abilify, Rexulti, BuSpar, Celexa, Lexapro, Prozac, Zoloft, Effexor She is not getting great control from Cymbalta at the moment, but we will continue She will consider [place] and let me know if she wants a referral to try this [facility] therapy also

## 2020-09-06 NOTE — Assessment & Plan Note (Signed)
Congratulated on weight loss ?Discussed importance of healthy weight management ?Discussed diet and exercise  ?

## 2020-09-06 NOTE — Assessment & Plan Note (Signed)
Well-controlled with diet and exercise Not on medications Recheck metabolic panel Follow-up in 6 months

## 2020-09-06 NOTE — Assessment & Plan Note (Signed)
Patient CPAP has been recalled and she has not been using it for 2 months I suspect this is what her morning headaches are related to She will continue to check with servicer about new CPAP and may consider cleaning and using the 1 that she has until she is able to get a new one

## 2020-09-06 NOTE — Patient Instructions (Signed)
Look into [facility]

## 2020-09-06 NOTE — Assessment & Plan Note (Signed)
Previously well controlled with goal LDL less than 70 Continue Lipitor at current dose Recheck CMP and FLP at CPE

## 2020-09-06 NOTE — Assessment & Plan Note (Signed)
Well-controlled on last A1c of 6.2 Patient will send the results to [place] so we are able to abstract them Associated with/complicated by HTN, HLD, OSA Continue current medications Up-to-date on vaccinations and screenings ROI sent for last eye exam On statin Discussed diet and exercise Follow-up in 6 months

## 2020-09-07 ENCOUNTER — Telehealth: Payer: Self-pay

## 2020-09-07 DIAGNOSIS — F3341 Major depressive disorder, recurrent, in partial remission: Secondary | ICD-10-CM

## 2020-09-07 LAB — BASIC METABOLIC PANEL
BUN/Creatinine Ratio: 16 (ref 12–28)
BUN: 11 mg/dL (ref 8–27)
CO2: 24 mmol/L (ref 20–29)
Calcium: 9.9 mg/dL (ref 8.7–10.3)
Chloride: 100 mmol/L (ref 96–106)
Creatinine, Ser: 0.7 mg/dL (ref 0.57–1.00)
Glucose: 106 mg/dL — ABNORMAL HIGH (ref 65–99)
Potassium: 4.9 mmol/L (ref 3.5–5.2)
Sodium: 139 mmol/L (ref 134–144)
eGFR: 99 mL/min/{1.73_m2} (ref >59)

## 2020-09-07 NOTE — Telephone Encounter (Signed)
Copied from [place] [id number]. Topic: Referral - Request for Referral >> Sep 07, 2020  3:19 PM [name], [name] wrote: Has patient seen PCP for this complaint? YES Referral for which specialty: Angelica Medina  Pt stated she discussed this with Dr B and she wants to go ahead with the referral they discussed in office. Please advise

## 2020-09-08 NOTE — Telephone Encounter (Signed)
Please place referral to [name] ([facility] [place]) for depression. Write [name] in comments. Thanks!

## 2020-09-12 DIAGNOSIS — M722 Plantar fascial fibromatosis: Secondary | ICD-10-CM | POA: Diagnosis not present

## 2020-09-14 ENCOUNTER — Other Ambulatory Visit: Payer: Self-pay

## 2020-09-14 ENCOUNTER — Ambulatory Visit
Admission: RE | Admit: 2020-09-14 | Discharge: 2020-09-14 | Disposition: A | Discharge status: Home / Self Care | Payer: BC Managed Care – PPO | Source: Ambulatory Visit | Attending: Family Medicine | Admitting: Family Medicine

## 2020-09-14 DIAGNOSIS — Z1231 Encounter for screening mammogram for malignant neoplasm of breast: Secondary | ICD-10-CM | POA: Diagnosis not present

## 2020-09-20 NOTE — Telephone Encounter (Signed)
Patient checking on the status of referral mentioned below and would like a follow up call regarding where referral was placed. Patient has not heard from specialist office and would like to call directly

## 2020-09-21 ENCOUNTER — Encounter: Payer: Self-pay | Admitting: Family Medicine

## 2020-09-22 ENCOUNTER — Other Ambulatory Visit: Payer: Self-pay

## 2020-09-22 ENCOUNTER — Other Ambulatory Visit
Admission: RE | Admit: 2020-09-22 | Discharge: 2020-09-22 | Disposition: A | Discharge status: Home / Self Care | Payer: BC Managed Care – PPO | Source: Ambulatory Visit | Attending: Gastroenterology | Admitting: Gastroenterology

## 2020-09-22 DIAGNOSIS — Z01812 Encounter for preprocedural laboratory examination: Secondary | ICD-10-CM | POA: Diagnosis not present

## 2020-09-22 DIAGNOSIS — Z20822 Contact with and (suspected) exposure to covid-19: Secondary | ICD-10-CM | POA: Insufficient documentation

## 2020-09-22 LAB — SARS CORONAVIRUS 2 (TAT 6-24 HRS): SARS Coronavirus 2: NEGATIVE

## 2020-09-22 NOTE — Telephone Encounter (Signed)
Angelica Medina, is there a way we can find the status of this referral?

## 2020-09-23 ENCOUNTER — Encounter: Payer: Self-pay | Admitting: Gastroenterology

## 2020-09-26 ENCOUNTER — Ambulatory Visit: Payer: BC Managed Care – PPO | Admitting: Anesthesiology

## 2020-09-26 ENCOUNTER — Ambulatory Visit
Admission: RE | Admit: 2020-09-26 | Discharge: 2020-09-26 | Disposition: A | Discharge status: Home / Self Care | Payer: BC Managed Care – PPO | Attending: Gastroenterology | Admitting: Gastroenterology

## 2020-09-26 ENCOUNTER — Encounter
Admission: RE | Disposition: A | Discharge status: Home / Self Care | Payer: Self-pay | Source: Home / Self Care | Attending: Gastroenterology

## 2020-09-26 ENCOUNTER — Other Ambulatory Visit: Payer: Self-pay

## 2020-09-26 DIAGNOSIS — Z885 Allergy status to narcotic agent status: Secondary | ICD-10-CM | POA: Diagnosis not present

## 2020-09-26 DIAGNOSIS — K5730 Diverticulosis of large intestine without perforation or abscess without bleeding: Secondary | ICD-10-CM | POA: Insufficient documentation

## 2020-09-26 DIAGNOSIS — Z833 Family history of diabetes mellitus: Secondary | ICD-10-CM | POA: Insufficient documentation

## 2020-09-26 DIAGNOSIS — I10 Essential (primary) hypertension: Secondary | ICD-10-CM | POA: Insufficient documentation

## 2020-09-26 DIAGNOSIS — Z825 Family history of asthma and other chronic lower respiratory diseases: Secondary | ICD-10-CM | POA: Diagnosis not present

## 2020-09-26 DIAGNOSIS — E119 Type 2 diabetes mellitus without complications: Secondary | ICD-10-CM | POA: Insufficient documentation

## 2020-09-26 DIAGNOSIS — Z7984 Long term (current) use of oral hypoglycemic drugs: Secondary | ICD-10-CM | POA: Diagnosis not present

## 2020-09-26 DIAGNOSIS — Z87891 Personal history of nicotine dependence: Secondary | ICD-10-CM | POA: Insufficient documentation

## 2020-09-26 DIAGNOSIS — Z808 Family history of malignant neoplasm of other organs or systems: Secondary | ICD-10-CM | POA: Diagnosis not present

## 2020-09-26 DIAGNOSIS — Z888 Allergy status to other drugs, medicaments and biological substances status: Secondary | ICD-10-CM | POA: Insufficient documentation

## 2020-09-26 DIAGNOSIS — Z79899 Other long term (current) drug therapy: Secondary | ICD-10-CM | POA: Insufficient documentation

## 2020-09-26 DIAGNOSIS — K5790 Diverticulosis of intestine, part unspecified, without perforation or abscess without bleeding: Secondary | ICD-10-CM | POA: Diagnosis not present

## 2020-09-26 DIAGNOSIS — Z1211 Encounter for screening for malignant neoplasm of colon: Secondary | ICD-10-CM | POA: Diagnosis not present

## 2020-09-26 DIAGNOSIS — E1169 Type 2 diabetes mellitus with other specified complication: Secondary | ICD-10-CM | POA: Diagnosis not present

## 2020-09-26 DIAGNOSIS — Z8349 Family history of other endocrine, nutritional and metabolic diseases: Secondary | ICD-10-CM | POA: Insufficient documentation

## 2020-09-26 HISTORY — PX: COLONOSCOPY WITH PROPOFOL: SHX5780

## 2020-09-26 LAB — GLUCOSE, CAPILLARY: Glucose-Capillary: 99 mg/dL (ref 70–99)

## 2020-09-26 SURGERY — COLONOSCOPY WITH PROPOFOL
Anesthesia: General

## 2020-09-26 MED ORDER — PROPOFOL 500 MG/50ML IV EMUL
INTRAVENOUS | Status: AC
  Filled 2020-09-26: qty 50

## 2020-09-26 MED ORDER — PHENYLEPHRINE HCL (PRESSORS) 10 MG/ML IV SOLN
INTRAVENOUS | Status: AC
  Filled 2020-09-26: qty 1

## 2020-09-26 MED ORDER — PROPOFOL 10 MG/ML IV BOLUS
INTRAVENOUS | Status: DC | PRN
  Administered 2020-09-26: 100 mg via INTRAVENOUS

## 2020-09-26 MED ORDER — SODIUM CHLORIDE 0.9 % IV SOLN: INTRAVENOUS | Status: DC

## 2020-09-26 MED ORDER — PROPOFOL 10 MG/ML IV BOLUS
INTRAVENOUS | Status: AC
  Filled 2020-09-26: qty 20

## 2020-09-26 MED ORDER — LIDOCAINE HCL (PF) 2 % IJ SOLN
INTRAMUSCULAR | Status: AC
  Filled 2020-09-26: qty 5

## 2020-09-26 MED ORDER — PROPOFOL 500 MG/50ML IV EMUL
INTRAVENOUS | Status: DC | PRN
  Administered 2020-09-26: 120 ug/kg/min via INTRAVENOUS

## 2020-09-26 MED ORDER — LIDOCAINE HCL (CARDIAC) PF 100 MG/5ML IV SOSY
PREFILLED_SYRINGE | INTRAVENOUS | Status: DC | PRN
  Administered 2020-09-26: 50 mg via INTRAVENOUS

## 2020-09-26 NOTE — Transfer of Care (Signed)
Immediate Anesthesia Transfer of Care Note  Patient: Adely Facer  Procedure(s) Performed: COLONOSCOPY WITH PROPOFOL (N/A )  Patient Location: PACU and Endoscopy Unit  Anesthesia Type:General  Level of Consciousness: drowsy and patient cooperative  Airway & Oxygen Therapy: Patient Spontanous Breathing  Post-op Assessment: Report given to RN and Post -op Vital signs reviewed and stable  Post vital signs: Reviewed and stable  Last Vitals:  Vitals Value Taken Time  BP 114/75 09/26/20 0827  Temp    Pulse 58 09/26/20 0829  Resp 19 09/26/20 0829  SpO2 97 % 09/26/20 0829  Vitals shown include unvalidated device data.  Last Pain:  Vitals:   09/26/20 0820  TempSrc: Temporal  PainSc:          Complications: No complications documented.

## 2020-09-26 NOTE — Op Note (Signed)
[facility] Gastroenterology Patient Name: Angelica Medina Procedure Date: 09/26/2020 7:45 AM MRN: [id number] Account #: [id number] Date of Birth: 1961-05-06 Admit Type: Outpatient Age: 60 Room: [facility] ENDO ROOM 2 Gender: Female Note Status: Finalized Procedure:             Colonoscopy Indications:           Screening for colorectal malignant neoplasm Providers:             Jonathon Bellows MD, MD Referring MD:          Dionne Bucy. Bacigalupo (Referring MD) Medicines:             Monitored Anesthesia Care Complications:         No immediate complications. Procedure:             Pre-Anesthesia Assessment:                        - Prior to the procedure, a History and Physical was                         performed, and patient medications, allergies and                         sensitivities were reviewed. The patient's tolerance                         of previous anesthesia was reviewed.                        - The risks and benefits of the procedure and the                         sedation options and risks were discussed with the                         patient. All questions were answered and informed                         consent was obtained.                        - ASA Grade Assessment: II - A patient with mild                         systemic disease.                        After obtaining informed consent, the colonoscope was                         passed under direct vision. Throughout the procedure,                         the patient's blood pressure, pulse, and oxygen                         saturations were monitored continuously. The                         Colonoscope was introduced through the anus and  advanced to the the cecum, identified by the                         appendiceal orifice. The colonoscopy was performed                         with ease. The patient tolerated the procedure well.                         The quality  of the bowel preparation was good. Findings:      The perianal and digital rectal examinations were normal.      Multiple small-mouthed diverticula were found in the sigmoid colon.      The exam was otherwise without abnormality on direct and retroflexion       views. Impression:            - Diverticulosis in the sigmoid colon.                        - The examination was otherwise normal on direct and                         retroflexion views.                        - No specimens collected. Recommendation:        - Discharge patient to home (with escort).                        - Resume previous diet.                        - Continue present medications.                        - Repeat colonoscopy in 10 years for screening                         purposes. Procedure Code(s):     --- Professional ---                        [phone], Colonoscopy, flexible; diagnostic, including                         collection of specimen(s) by brushing or washing, when                         performed (separate procedure) CPT copyright 2019 American Medical Association. All rights reserved. The codes documented in this report are preliminary and upon coder review may  be revised to meet current compliance requirements. Jonathon Bellows, MD Jonathon Bellows MD, MD 09/26/2020 8:26:07 AM This report has been signed electronically. Number of Addenda: 0 Note Initiated On: 09/26/2020 7:45 AM Scope Withdrawal Time: 0 hours 8 minutes 53 seconds  Total Procedure Duration: 0 hours 14 minutes 16 seconds  Estimated Blood Loss:  Estimated blood loss: none.      [facility]

## 2020-09-26 NOTE — Anesthesia Preprocedure Evaluation (Signed)
Anesthesia Evaluation  Patient identified by MRN, date of birth, ID band Patient awake    Reviewed: Allergy & Precautions, NPO status , Patient's Chart, lab work & pertinent test results  History of Anesthesia Complications Negative for: history of anesthetic complications  Airway Mallampati: III       Dental   Pulmonary sleep apnea and Continuous Positive Airway Pressure Ventilation , neg COPD, former smoker,           Cardiovascular hypertension, (-) Past MI and (-) CHF (-) dysrhythmias (-) Valvular Problems/Murmurs     Neuro/Psych neg Seizures Anxiety Depression    GI/Hepatic Neg liver ROS, GERD  ,  Endo/Other  diabetes, Type 2, Oral Hypoglycemic Agents  Renal/GU negative Renal ROS     Musculoskeletal   Abdominal   Peds  Hematology   Anesthesia Other Findings   Reproductive/Obstetrics                             Anesthesia Physical Anesthesia Plan  ASA: III  Anesthesia Plan: General   Post-op Pain Management:    Induction: Intravenous  PONV Risk Score and Plan: 3 and Propofol infusion and TIVA  Airway Management Planned: Nasal Cannula  Additional Equipment:   Intra-op Plan:   Post-operative Plan:   Informed Consent: I have reviewed the patients History and Physical, chart, labs and discussed the procedure including the risks, benefits and alternatives for the proposed anesthesia with the patient or authorized representative who has indicated his/her understanding and acceptance.       Plan Discussed with:   Anesthesia Plan Comments:         Anesthesia Quick Evaluation

## 2020-09-26 NOTE — H&P (Signed)
Jonathon Bellows, MD [address], [place], [place], [place], [address] [address], [place], [place], [place], [address] Phone: [phone]  Fax: [phone]  Primary Care Physician:  Virginia Crews, MD   Pre-Procedure History & Physical: HPI:  Angelica Medina is a 60 y.o. female is here for an colonoscopy.   Past Medical History:  Diagnosis Date  . Anxiety   . Depression   . Hypertension 2016  . Sleep apnea   . T2DM (type 2 diabetes mellitus) ([place]) 2016   diet controlled    Past Surgical History:  Procedure Laterality Date  . [place]   for herniated disc, no hardware  . BREAST BIOPSY Left 2009   benign  . ECTOPIC PREGNANCY SURGERY    . ESOPHAGEAL DILATION  2014   has had several i nthe past for achalasia  . OPEN REDUCTION INTERNAL FIXATION (ORIF) DISTAL RADIAL FRACTURE Left 09/17/2017   Procedure: OPEN REDUCTION INTERNAL FIXATION (ORIF) DISTAL RADIAL FRACTURE;  Surgeon: Lovell Sheehan, MD;  Location: [facility] ORS;  Service: Orthopedics;  Laterality: Left;    Prior to Admission medications   Medication Sig Start Date End Date Taking? Authorizing Provider  atorvastatin (LIPITOR) 20 MG tablet TAKE ONE TABLET BY MOUTH DAILY 07/19/20   Virginia Crews, MD  b complex vitamins capsule Take 1 capsule by mouth daily.    [provider]  DULoxetine (CYMBALTA) 30 MG capsule Take 1 capsule (30 mg total) by mouth daily. 10/28/19   Virginia Crews, MD  fluticasone (FLONASE) 50 MCG/ACT nasal spray Place 2 sprays into both nostrils daily. 08/18/20   Virginia Crews, MD  metFORMIN (GLUCOPHAGE) 500 MG tablet Take 500 mg by mouth daily with breakfast.    [provider]  Multiple Vitamin (MULTIVITAMIN) tablet Take 1 tablet by mouth daily.    [provider]  Omega-3 1000 MG CAPS Take 1 tablet by mouth daily.    [provider]    Allergies as of 09/02/2020 - Review Complete 08/18/2020  Allergen Reaction Noted  .  Bupropion Anxiety and Other (See Comments) 02/28/2017  . Oxycodone Itching 09/16/2017    Family History  Problem Relation Age of Onset  . Thyroid cancer Mother   . Anxiety disorder Mother   . Depression Mother   . Depression Father   . Diabetes Father   . COPD Father   . Anxiety disorder Father   . Hypothyroidism Sister   . Anxiety disorder Sister   . Healthy Brother   . Anxiety disorder Brother   . Anxiety disorder Brother   . Anxiety disorder Sister   . Depression Sister   . Drug abuse Paternal Aunt   . Alcohol abuse Maternal Grandfather   . Alcohol abuse Paternal Grandfather   . Alcohol abuse Paternal Grandmother   . Drug abuse Other     Social History   Socioeconomic History  . Marital status: Married    Spouse name: [name]  . Number of children: 3  . Years of education: 16  . Highest education level: High school graduate  Occupational History  . 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 Smoking status: Former Smoker    Packs/day: 0.50    Years: 20.00    Pack years: 10.00    Quit date: 07/01/1994    Years since quitting: 26.2  . Smokeless tobacco: Never Used  Vaping Use  . Vaping Use: Never used  Substance and Sexual Activity  .  Alcohol use: No  . Drug use: No  . Sexual activity: Not Currently  Other Topics Concern  . Not on file  Social History Narrative  . Not on file   Social Determinants of Health   Financial Resource Strain: Not on file  Food Insecurity: Not on file  Transportation Needs: Not on file  Physical Activity: Not on file  Stress: Not on file  Social Connections: Not on file  Intimate Partner Violence: Not on file    Review of Systems: See HPI, otherwise negative ROS  Physical Exam: BP (!) 167/87   Pulse 65   Temp (!) 97.2 F (36.2 C) (Temporal)   Resp 18   Ht 5\' 7"  (1.702 m)   Wt 87.5 kg   SpO2 98%   BMI 30.23 kg/m  General:   Alert,  pleasant and cooperative in NAD Head:  Normocephalic and  atraumatic. Neck:  Supple; no masses or thyromegaly. Lungs:  Clear throughout to auscultation, normal respiratory effort.    Heart:  +S1, +S2, Regular rate and rhythm, No edema. Abdomen:  Soft, nontender and nondistended. Normal bowel sounds, without guarding, and without rebound.   Neurologic:  Alert and  oriented x4;  grossly normal neurologically.  Impression/Plan: Angelica Medina is here for an colonoscopy to be performed for Screening colonoscopy average risk   Risks, benefits, limitations, and alternatives regarding  colonoscopy have been reviewed with the patient.  Questions have been answered.  All parties agreeable.   Jonathon Bellows, MD  09/26/2020, 8:04 AM

## 2020-09-26 NOTE — Anesthesia Postprocedure Evaluation (Signed)
Anesthesia Post Note  Patient: Angelica Medina  Procedure(s) Performed: COLONOSCOPY WITH PROPOFOL (N/A )  Patient location during evaluation: Endoscopy Anesthesia Type: General Level of consciousness: awake and alert Pain management: pain level controlled Vital Signs Assessment: post-procedure vital signs reviewed and stable Respiratory status: spontaneous breathing and respiratory function stable Cardiovascular status: stable Anesthetic complications: no   No complications documented.   Last Vitals:  Vitals:   09/26/20 0840 09/26/20 0850  BP: 137/76 133/74  Pulse: (!) 59 60  Resp: 15 16  Temp:    SpO2: 100% 100%    Last Pain:  Vitals:   09/26/20 0820  TempSrc: Temporal  PainSc:                  Quency Tober K

## 2020-09-27 ENCOUNTER — Encounter: Payer: Self-pay | Admitting: Gastroenterology

## 2020-09-28 DIAGNOSIS — M722 Plantar fascial fibromatosis: Secondary | ICD-10-CM | POA: Diagnosis not present

## 2020-09-28 DIAGNOSIS — M79672 Pain in left foot: Secondary | ICD-10-CM | POA: Diagnosis not present

## 2020-10-03 NOTE — Telephone Encounter (Signed)
Per office pt is scheduled with Dr Dwyane Dee 10/10/20

## 2020-10-04 ENCOUNTER — Other Ambulatory Visit: Payer: Self-pay

## 2020-10-04 ENCOUNTER — Ambulatory Visit (INDEPENDENT_AMBULATORY_CARE_PROVIDER_SITE_OTHER): Payer: BC Managed Care – PPO | Admitting: Psychiatry

## 2020-10-04 VITALS — BP 122/82 | HR 68 | Temp 98.6°F

## 2020-10-04 DIAGNOSIS — F332 Major depressive disorder, recurrent severe without psychotic features: Secondary | ICD-10-CM | POA: Diagnosis not present

## 2020-10-04 DIAGNOSIS — F411 Generalized anxiety disorder: Secondary | ICD-10-CM | POA: Diagnosis not present

## 2020-10-04 NOTE — Telephone Encounter (Signed)
Patient advised as below.  

## 2020-10-04 NOTE — Telephone Encounter (Signed)
Angelica Abbot, MD  [facility] at [address] [place], [place] [address] [phone]

## 2020-10-05 ENCOUNTER — Encounter (HOSPITAL_COMMUNITY): Payer: Self-pay | Admitting: Psychiatry

## 2020-10-05 NOTE — Progress Notes (Signed)
Psychiatric Initial Adult Assessment   Patient Identification: Angelica Medina MRN:  [id number] Date of Evaluation:  10/05/2020 Referral Source: Primary care physician Chief Complaint:  I am struggling with depression, nothing has helped and is gotten progressively worse Visit Diagnosis: No diagnosis found.  History of Present Illness: Patient is a 60 year old female referred by her primary care physician for complaints of worsening of depression, poor response to medications, and patient feeling overwhelmed, not enjoying day-to-day activities, feeling tired a lot. Patient reports that she is struggled on and off for depression for 20 years now but adds that over the past 5 years, her symptoms have worsened and she is not had any response in regards to her depression to medications.  On a scale of 0-10, with 0 being no symptoms and 10 being the worst, patient reports her depression is mostly an 8 or 9 out of 10.  She denies any aggravating or relieving factors.  She also reports that she does not enjoy day-to-day activities, has to push herself to clear that she is okay  Associated Signs/Symptoms: Depression Symptoms:  depressed mood, anhedonia, hypersomnia, psychomotor retardation, feelings of worthlessness/guilt, difficulty concentrating, hopelessness, anxiety, loss of energy/fatigue, disturbed sleep, increased appetite, (Hypo) Manic Symptoms:  Distractibility, Anxiety Symptoms:  Excessive Worry, Psychotic Symptoms:  Hallucinations: None PTSD Symptoms: NA  Past Psychiatric History: Patient reports that she is seeing a therapist on and off since college, saw a psychiatrist for a brief period of time but currently sees her PCP for medication management.  She reports that she has been tried on various psychotropic medications with no benefit.  Patient reports that she has never had a psychiatric hospitalization, any history of suicide attempts, self mutilating behaviors.  Patient's past  psychotropic medications include Abilify, Wellbutrin, Paxil, BuSpar, trazodone, Zoloft, Effexor.  Patient reports that she currently takes Cymbalta 30 mg daily  Previous Psychotropic Medications: No   Substance Abuse History in the last 12 months:  No.  Consequences of Substance Abuse: NA  Past Medical History:  Past Medical History:  Diagnosis Date  . Anxiety   . Depression   . Hypertension 2016  . Sleep apnea   . T2DM (type 2 diabetes mellitus) ([place]) 2016   diet controlled    Past Surgical History:  Procedure Laterality Date  . [place]   for herniated disc, no hardware  . BREAST BIOPSY Left 2009   benign  . COLONOSCOPY WITH PROPOFOL N/A 09/26/2020   Procedure: COLONOSCOPY WITH PROPOFOL;  Surgeon: Jonathon Bellows, MD;  Location: [facility] ENDOSCOPY;  Service: Gastroenterology;  Laterality: N/A;  . ECTOPIC PREGNANCY SURGERY    . ESOPHAGEAL DILATION  2014   has had several i nthe past for achalasia  . OPEN REDUCTION INTERNAL FIXATION (ORIF) DISTAL RADIAL FRACTURE Left 09/17/2017   Procedure: OPEN REDUCTION INTERNAL FIXATION (ORIF) DISTAL RADIAL FRACTURE;  Surgeon: Lovell Sheehan, MD;  Location: [facility] ORS;  Service: Orthopedics;  Laterality: Left;    Family Psychiatric History: Patient reports that her paternal great grandfather suffered from depression, and completed suicide.  There is history of depression in her dad and mom and history of anxiety disorder in mom.  She reports that her brother also suffers from anxiety and depression and so does her sister.  There is also history of alcohol use disorder in paternal grandparents and maternal grandfather  Family History:  Family History  Problem Relation Age of Onset  . Thyroid cancer Mother   . Anxiety disorder Mother   . Depression  Mother   . Depression Father   . Diabetes Father   . COPD Father   . Anxiety disorder Father   . Hypothyroidism Sister   . Anxiety disorder Sister   . Healthy Brother   . Anxiety disorder  Brother   . Anxiety disorder Brother   . Anxiety disorder Sister   . Depression Sister   . Drug abuse Paternal Aunt   . Alcohol abuse Maternal Grandfather   . Alcohol abuse Paternal Grandfather   . Alcohol abuse Paternal Grandmother   . Drug abuse Other     Social History:   Social History   Socioeconomic History  . Marital status: Married    Spouse name: [name]  . Number of children: 3  . Years of education: 29  . Highest education level: High school graduate  Occupational History  . 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 Smoking status: Former Smoker    Packs/day: 0.50    Years: 20.00    Pack years: 10.00    Quit date: 07/01/1994    Years since quitting: 26.2  . Smokeless tobacco: Never Used  Vaping Use  . Vaping Use: Never used  Substance and Sexual Activity  . Alcohol use: No  . Drug use: No  . Sexual activity: Not Currently  Other Topics Concern  . Not on file  Social History Narrative  . Not on file   Social Determinants of Health   Financial Resource Strain: Not on file  Food Insecurity: Not on file  Transportation Needs: Not on file  Physical Activity: Not on file  Stress: Not on file  Social Connections: Not on file    Additional Social History: Patient reports that she is retired, moved from [place] to [place] a few years ago.  Patient is married and has 3 children and 1 grandchild  Allergies:   Allergies  Allergen Reactions  . Bupropion Anxiety and Other (See Comments)  . Oxycodone Itching    Metabolic Disorder Labs: Lab Results  Component Value Date   HGBA1C 5.9 01/06/2020   No results found for: PROLACTIN Lab Results  Component Value Date   CHOL 121 01/06/2020   TRIG 57 01/06/2020   HDL 50 01/06/2020   CHOLHDL 2.5 07/31/2019   LDLCALC 50 01/06/2020   LDLCALC 65 07/31/2019   Lab Results  Component Value Date   TSH 1.390 07/31/2019    Therapeutic Level Labs: No results found for: LITHIUM No results found  for: CBMZ No results found for: VALPROATE  Current Medications: Current Outpatient Medications  Medication Sig Dispense Refill  . atorvastatin (LIPITOR) 20 MG tablet TAKE ONE TABLET BY MOUTH DAILY 90 tablet 1  . b complex vitamins capsule Take 1 capsule by mouth daily.    . DULoxetine (CYMBALTA) 30 MG capsule Take 1 capsule (30 mg total) by mouth daily. 90 capsule 3  . fluticasone (FLONASE) 50 MCG/ACT nasal spray Place 2 sprays into both nostrils daily. 16 g 6  . metFORMIN (GLUCOPHAGE) 500 MG tablet Take 500 mg by mouth daily with breakfast.    . Multiple Vitamin (MULTIVITAMIN) tablet Take 1 tablet by mouth daily.    . Omega-3 1000 MG CAPS Take 1 tablet by mouth daily.     No current facility-administered medications for this visit.    Musculoskeletal: Strength & Muscle Tone: within normal limits Gait & Station: normal Patient leans: N/A  Psychiatric Specialty Exam: Review of Systems  Constitutional: Positive for appetite change and fatigue. Negative for  fever.  HENT: Negative for congestion, sneezing and sore throat.   Eyes: Negative.  Negative for discharge, redness and visual disturbance.  Respiratory: Negative.  Negative for cough, shortness of breath and wheezing.   Cardiovascular: Negative.  Negative for palpitations.  Gastrointestinal: Negative.  Negative for constipation, diarrhea and nausea.  Neurological: Negative.  Negative for dizziness, tremors and seizures.  Psychiatric/Behavioral: Positive for decreased concentration, dysphoric mood and sleep disturbance. Negative for agitation, behavioral problems, confusion, hallucinations, self-injury and suicidal ideas. The patient is nervous/anxious. The patient is not hyperactive.     There were no vitals taken for this visit.There is no height or weight on file to calculate BMI.  General Appearance: Casual  Eye Contact:  Fair  Speech:  Clear and Coherent and Normal Rate  Volume:  Decreased  Mood:  Anxious and Depressed   Affect:  Congruent and Constricted  Thought Process:  Coherent, Goal Directed and Descriptions of Associations: Intact  Orientation:  Full (Time, Place, and Person)  Thought Content:  Logical and Rumination  Suicidal Thoughts:  No  Homicidal Thoughts:  No  Memory:  Immediate;   Fair Recent;   Fair Remote;   Fair  Judgement:  Intact  Insight:  Present  Psychomotor Activity:  Normal  Concentration:  Concentration: Fair and Attention Span: Fair  Recall:  [employer] of Knowledge:Good  Language: Fair  Akathisia:  No  Handed:  Right  AIMS (if indicated):  not done  Assets:  Communication Skills Desire for Improvement Financial Resources/Insurance Housing Intimacy Leisure Time Social Support Talents/Skills Transportation  ADL's:  Intact  Cognition: WNL  Sleep:  Poor   Screenings: GAD-7   Flowsheet Row Office Visit from 01/23/2019 in [place] Visit from 11/10/2018 in [place] Visit from 10/15/2018 in [place] Visit from 05/21/2018 in [place] Visit from 09/11/2017 in [facility]  Total GAD-7 Score 2 17 8 8 7     PHQ2-9   [place] Visit from 10/04/2020 in [facility] Office Visit from 09/06/2020 in [place] Visit from 07/31/2019 in [place] Visit from 01/23/2019 in [place] Visit from 11/10/2018 in [place]  PHQ-2 Total Score 6 5 2 2 6   PHQ-9 Total Score 21 12 7 8 21     [place] Admission (Discharged) from 09/26/2020 in [place] No Risk      Assessment and Plan: It is a 60 year old diagnosed with major depressive disorder for many years now which seems to have worsened over the past 5 to 6 years with no response to medications.  Patient reports that she feels hopeless, feels  that she is a failure to her family, struggles with day-to-day activity, has no enjoyment of pleasure in anything she does.  She reports that her family is very supportive and she would like to try TMS to see if that will help with her depression.  She does report that there is a significant history of depression in the family, her great-grandfather did have ECT done.  50% of this visit was spent in obtaining history, previous treatment, discussing all treatment options including ECT.  Also [place] was discussed in length and patient states that she would like to try [place] as she wants to feel better, enjoy her family, and not feel hopeless, anxious all the time.  Also discussed coping strategies, activities which patient could  participate in which might her.  This was a 50-minute visit.  Hampton Abbot, MD 4/6/20221:24 PM

## 2020-10-10 ENCOUNTER — Ambulatory Visit (HOSPITAL_COMMUNITY): Payer: Self-pay | Admitting: Psychiatry

## 2020-10-19 ENCOUNTER — Other Ambulatory Visit: Payer: Self-pay | Admitting: Family Medicine

## 2020-10-27 ENCOUNTER — Ambulatory Visit (INDEPENDENT_AMBULATORY_CARE_PROVIDER_SITE_OTHER): Payer: BC Managed Care – PPO | Admitting: Psychiatry

## 2020-10-27 ENCOUNTER — Other Ambulatory Visit: Payer: Self-pay

## 2020-10-27 VITALS — BP 98/64 | HR 57

## 2020-10-27 DIAGNOSIS — F332 Major depressive disorder, recurrent severe without psychotic features: Secondary | ICD-10-CM

## 2020-10-27 NOTE — Progress Notes (Signed)
Pt reported to [facility] for cortical mapping and motor threshold determination for Repetitive Transcranial Magnetic Stimulation treatment for Major Depressive Disorder. Pt completed a PHQ-9 with a score of 21(severe depression). Pt also completed a Beck's Depression Inventory with a score of 31 (severe depression). Prior to procedure, pt signed an informed consent agreement for [place] treatment. Pt's treatment area was found by applying single pulses to her left motor cortex, hunting along the anterior/posterior plane and along the superior oblique angle until the best motor response was elicited from the pt's right thumb. The best response was observed at 6 cm A/P and 30 degrees SOA, with a coil angle of 0 degrees. Pt's motor threshold was calculated using the [employer]'s proprietary MT Assist algorithm, which produced a calculated motor threshold of 1.96 SMT. Per these findings, pt's treatment parameters are as follows: A/P -- 11.5 cm, SOA -- 30 degrees, Coil Angle --  0 degrees, Motor Threshold -- 1.96 SMT. With these parameters, the pt will receive 36 sessions of TMS according to the following protocol: 3000 pulses per session, with stimulation in bursts of pulses lasting 4 seconds at a frequency of 10 Hz, separated by 26 seconds of rest. After determining pt's tx parameters, coil was moved to the treatment location, and the first burst of pulses was applied at a reduced power of 80% MT.   Pt had a seizure half way into the [place] session. Pt is oriented and alert. Seizure lasted proximately one minute. Magnet was removed and the psychiatrist was notified. Vitals were taken BP 98/64, HR 57, and O2 99%. Pt is not a good candidate for [place], and will not continue treatment. Psychiatrist discussed other options for treatment. [facility] coordinator will send a referral for ECT.

## 2020-10-31 ENCOUNTER — Ambulatory Visit (INDEPENDENT_AMBULATORY_CARE_PROVIDER_SITE_OTHER): Payer: Self-pay | Admitting: Psychiatry

## 2020-10-31 ENCOUNTER — Other Ambulatory Visit: Payer: Self-pay

## 2020-10-31 DIAGNOSIS — F411 Generalized anxiety disorder: Secondary | ICD-10-CM

## 2020-10-31 DIAGNOSIS — F332 Major depressive disorder, recurrent severe without psychotic features: Secondary | ICD-10-CM

## 2020-10-31 DIAGNOSIS — R200 Anesthesia of skin: Secondary | ICD-10-CM

## 2020-10-31 DIAGNOSIS — R569 Unspecified convulsions: Secondary | ICD-10-CM

## 2020-10-31 NOTE — Progress Notes (Signed)
Virtual Visit via Telephone Note  I connected with Angelica Medina on 10/31/20 at  2:00 PM EDT by telephone and verified that I am speaking with the correct person using two identifiers.  Location: Patient: Home Provider: Hospital   I discussed the limitations, risks, security and privacy concerns of performing an evaluation and management service by telephone and the availability of in person appointments. I also discussed with the patient that there may be a patient responsible charge related to this service. The patient expressed understanding and agreed to proceed.   History of Present Illness: Patient was reached by telephone for scheduled interview.  Patient was on time and prepared for appointment.  Introduced myself and discussed the nature of the appointment.  This is a 60 year old woman referred for consideration of ECT treatment for depression.  Patient describes current symptoms as being "I do not feel real well".  She feels down depressed and negative and dull all the time.  Has lost interest in most normal activities.  Does not feel any enthusiasm to do anything.  Wakes up frequently at night.  Energy low.  She denies having any suicidal thoughts at all.  Denies any psychotic symptoms.  Patient is currently taking duloxetine prescribed by her primary care doctor.  She recently was referred for transcranial magnetic stimulation but had a spontaneous seizure during the first treatment causing the further treatments to be canceled.  Patient denies active alcohol or drug abuse.  Medical problems are fairly well controlled mild to moderate diabetes and elevated cholesterol.  Patient does not necessarily describe any particular current stressors driving the depression.  Depression has been present for years.  Reports having depression going back many years.  No history of manic episodes.  Multiple antidepressants have been tried including many serotonin reuptake inhibitors including fluoxetine  Prozac as well as adjunct medications and the current Cymbalta.  Patient reports that she will have temporary response to medications but nothing that lasts.  No history of hospitalizations.  No history of suicide attempts.  Medical history positive for mild diabetes and hypertension well controlled with current medicine.  Patient reports that as a young child she had what was described at the time as a possible petit mall seizure for which she took phenobarbital for several years in childhood.  Subsequent to that no other episodes of documented seizures.  No history of head injury.  Family history is positive for multiple family members with depression including 3 of her siblings and her father who also had ECT.  Patient is married lives at home takes care of her grandchild during the day.  Describes reasonably good relations with her family.    Observations/Objective: Patient was alert oriented and cooperative.  Affect euthymic.  Mood stated as depressed.  No evidence of disorganized thinking or loosening of associations.  Denied suicidal or homicidal thought.  Alert and oriented x4 with intact short-term memory and cognition and intact ability to make reasonable decisions.   Assessment and Plan: This is a 60 year old woman with recurrent chronic major depression severe without psychotic features.  Multiple medication trials without lasting benefit.  Failed trial of transcranial magnetic stimulation.  I am concerned about the seizure that occurred during [place].  These are extraordinarily rare.  I think before we proceed we would want to get a head CT as well as the usual work-up.  Explained procedure to patient including details about general use of ECT as well as our procedure at the hospital.  Patient was given  opportunity to ask questions.  She stated that she would like to proceed with ECT.  I am going to put in orders for the usual labs as well as a head CT.  I will pass her name and information  along to our ECT nursing team.  We will not need to make any changes to current medicine.  She would be scheduled within the next couple weeks to start with right unilateral treatment.   Follow Up Instructions: Patient is to expect a call from ECT nursing    I discussed the assessment and treatment plan with the patient. The patient was provided an opportunity to ask questions and all were answered. The patient agreed with the plan and demonstrated an understanding of the instructions.   The patient was advised to call back or seek an in-person evaluation if the symptoms worsen or if the condition fails to improve as anticipated.  I provided 45 minutes of non-face-to-face time during this encounter.   Alethia Berthold, MD

## 2020-11-01 ENCOUNTER — Encounter: Payer: Self-pay | Admitting: Family Medicine

## 2020-11-01 DIAGNOSIS — F3341 Major depressive disorder, recurrent, in partial remission: Secondary | ICD-10-CM

## 2020-11-03 NOTE — Telephone Encounter (Signed)
Please place psych referral for Dr Nicolasa Ducking

## 2020-11-09 ENCOUNTER — Other Ambulatory Visit: Payer: BC Managed Care – PPO

## 2020-11-10 ENCOUNTER — Ambulatory Visit: Payer: BC Managed Care – PPO | Admitting: Dermatology

## 2020-11-15 ENCOUNTER — Telehealth: Payer: Self-pay | Admitting: Family Medicine

## 2020-11-15 MED ORDER — DULOXETINE HCL 60 MG PO CPEP: 60.0000 mg | ORAL_CAPSULE | Freq: Every day | ORAL | 1 refills | Status: DC

## 2020-11-15 NOTE — Telephone Encounter (Signed)
Patient states Dr. Rockney Ghee can not see her until 02/13/2021 therefore requesting PCP to increase her DULoxetine (CYMBALTA) 30 MG capsule to  2 capsules a day instead of 1 capsule a day. Patient requesting a follow up call

## 2020-11-15 NOTE — Telephone Encounter (Signed)
Ok to change cymbalta and Rx 60mg  daily 90 d Rx with 1 refill

## 2020-11-16 MED ORDER — DULOXETINE HCL 60 MG PO CPEP: 60.0000 mg | ORAL_CAPSULE | Freq: Every day | ORAL | 1 refills | Status: DC

## 2020-11-16 NOTE — Addendum Note (Signed)
Addended by: Ashley Royalty E on: 11/16/2020 11:22 AM   Modules accepted: Orders

## 2020-11-16 NOTE — Telephone Encounter (Signed)
RX sent to [facility] in [place].  Pt advised.   Thanks,   -Mickel Baas

## 2020-11-24 ENCOUNTER — Ambulatory Visit (INDEPENDENT_AMBULATORY_CARE_PROVIDER_SITE_OTHER): Payer: BC Managed Care – PPO | Admitting: Dermatology

## 2020-11-24 ENCOUNTER — Other Ambulatory Visit: Payer: Self-pay

## 2020-11-24 ENCOUNTER — Encounter: Payer: Self-pay | Admitting: Dermatology

## 2020-11-24 DIAGNOSIS — L578 Other skin changes due to chronic exposure to nonionizing radiation: Secondary | ICD-10-CM | POA: Diagnosis not present

## 2020-11-24 DIAGNOSIS — L814 Other melanin hyperpigmentation: Secondary | ICD-10-CM

## 2020-11-24 DIAGNOSIS — L821 Other seborrheic keratosis: Secondary | ICD-10-CM

## 2020-11-24 DIAGNOSIS — L719 Rosacea, unspecified: Secondary | ICD-10-CM

## 2020-11-24 DIAGNOSIS — D229 Melanocytic nevi, unspecified: Secondary | ICD-10-CM

## 2020-11-24 DIAGNOSIS — Z1283 Encounter for screening for malignant neoplasm of skin: Secondary | ICD-10-CM

## 2020-11-24 DIAGNOSIS — D1800 Hemangioma unspecified site: Secondary | ICD-10-CM

## 2020-11-24 MED ORDER — DOXYCYCLINE HYCLATE 20 MG PO TABS
20.0000 mg | ORAL_TABLET | Freq: Two times a day (BID) | ORAL | 2 refills | Status: AC
Start: 2020-11-24 — End: 2020-12-24

## 2020-11-24 NOTE — Patient Instructions (Addendum)
Recommend taking Heliocare sun protection supplement daily in sunny weather for additional sun protection. For maximum protection on the sunniest days, you can take up to 2 capsules of regular Heliocare OR take 1 capsule of Heliocare Ultra. For prolonged exposure (such as a full day in the sun), you can repeat your dose of the supplement 4 hours after your first dose. Heliocare can be purchased at [facility] or at [email or web address].   Doxycycline should be taken with food to prevent nausea. Do not lay down for 30 minutes after taking. Be cautious with sun exposure and use good sun protection while on this medication. Pregnant women should not take this medication.   Melanoma ABCDEs  Melanoma is the most dangerous type of skin cancer, and is the leading cause of death from skin disease.  You are more likely to develop melanoma if you:  Have light-colored skin, light-colored eyes, or red or blond hair  Spend a lot of time in the sun  Tan regularly, either outdoors or in a tanning bed  Have had blistering sunburns, especially during childhood  Have a close family member who has had a melanoma  Have atypical moles or large birthmarks  Early detection of melanoma is key since treatment is typically straightforward and cure rates are extremely high if we catch it early.   The first sign of melanoma is often a change in a mole or a new dark spot.  The ABCDE system is a way of remembering the signs of melanoma.  A for asymmetry:  The two halves do not match. B for border:  The edges of the growth are irregular. C for color:  A mixture of colors are present instead of an even brown color. D for diameter:  Melanomas are usually (but not always) greater than 88mm - the size of a pencil eraser. E for evolution:  The spot keeps changing in size, shape, and color.  Please check your skin once per month between visits. You can use a small mirror in front and a large mirror behind you to keep  an eye on the back side or your body.   If you see any new or changing lesions before your next follow-up, please call to schedule a visit.  Please continue daily skin protection including broad spectrum sunscreen SPF 30+ to sun-exposed areas, reapplying every 2 hours as needed when you're outdoors.   Staying in the shade or wearing long sleeves, sun glasses (UVA+UVB protection) and wide brim hats (4-inch brim around the entire circumference of the hat) are also recommended for sun protection.   If you have any questions or concerns for your doctor, please call our main line at [phone] and press option 4 to reach your doctor's medical assistant. If no one answers, please leave a voicemail as directed and we will return your call as soon as possible. Messages left after 4 pm will be answered the following business day.   You may also send [place] a message via [place]. We typically respond to MyChart messages within 1-2 business days.  For prescription refills, please ask your pharmacy to contact our office. Our fax number is [phone].  If you have an urgent issue when the clinic is closed that cannot wait until the next business day, you can page your doctor at the number below.    Please note that while we do our best to be available for urgent issues outside of office hours, we are not available 24/7.  If you have an urgent issue and are unable to reach [place], you may choose to seek medical care at your doctor's office, retail clinic, urgent care center, or emergency room.  If you have a medical emergency, please immediately call 911 or go to the emergency department.  Pager Numbers  - Dr. Nehemiah Massed: [phone]  - Dr. Laurence Ferrari: [phone]  - Dr. Nicole Kindred: [phone]  In the event of inclement weather, please call our main line at [phone] for an update on the status of any delays or closures.  Dermatology Medication Tips: Please keep the boxes that topical medications come in  in order to help keep track of the instructions about where and how to use these. Pharmacies typically print the medication instructions only on the boxes and not directly on the medication tubes.   If your medication is too expensive, please contact our office at [phone] option 4 or send [place] a message through [place].   We are unable to tell what your co-pay for medications will be in advance as this is different depending on your insurance coverage. However, we may be able to find a substitute medication at lower cost or fill out paperwork to get insurance to cover a needed medication.   If a prior authorization is required to get your medication covered by your insurance company, please allow [place] 1-2 business days to complete this process.  Drug prices often vary depending on where the prescription is filled and some pharmacies may offer cheaper prices.  The website www.goodrx.com contains coupons for medications through different pharmacies. The prices here do not account for what the cost may be with help from insurance (it may be cheaper with your insurance), but the website can give you the price if you did not use any insurance.  - You can print the associated coupon and take it with your prescription to the pharmacy.  - You may also stop by our office during regular business hours and pick up a GoodRx coupon card.  - If you need your prescription sent electronically to a different pharmacy, notify our office through [facility] or by phone at [phone] option 4.

## 2020-11-24 NOTE — Progress Notes (Signed)
New Patient Visit  Subjective  Angelica Medina is a 60 y.o. female who presents for the following: New Patient (Initial Visit) (Patient here today as a new patient and would like to have tbse. She denies history of family or personal history of skin cancer. Patient reports a itchy spot under chin that has been there for several years. ).  Patient here for full body skin exam and skin cancer screening.  Objective  Well appearing patient in no apparent distress; mood and affect are within normal limits.  A full examination was performed including scalp, head, eyes, ears, nose, lips, neck, chest, axillae, abdomen, back, buttocks, bilateral upper extremities, bilateral lower extremities, hands, feet, fingers, toes, fingernails, and toenails. All findings within normal limits unless otherwise noted below.  Objective  Head - Anterior (Face): Mid face erythema with telangiectasias +/- scattered inflammatory papules.   Assessment & Plan  Rosacea Head - Anterior (Face)  Chronic condition with duration or expected duration over one year. Condition is bothersome to patient. Currently flared.  Patient reports gritty sensation of eyes - will treat with doxycycline to see if she has improvement. If no improvement, will switch to topical therapy.  Rosacea is a chronic progressive skin condition usually affecting the face of adults, causing redness and/or acne bumps. It is treatable but not curable. It sometimes affects the eyes (ocular rosacea) as well. It may respond to topical and/or systemic medication and can flare with stress, sun exposure, alcohol, exercise and some foods.  Daily application of broad spectrum spf 30+ sunscreen to face is recommended to reduce flares.  Start doxycycline 20 mg twice a day with food.   Doxycycline should be taken with food to prevent nausea. Do not lay down for 30 minutes after taking. Be cautious with sun exposure and use good sun protection while on this  medication. Pregnant women should not take this medication.   Discussed bbl light treatment as a long term solution for redness  Patient instructed to call in next 3 - 4 weeks to let us know how she is doing and if she has noticed improvement in her eye symptoms.  doxycycline (PERIOSTAT) 20 MG tablet - Head - Anterior (Face)   Lentigines - Scattered tan macules - Due to sun exposure - Benign-appering, observe - Recommend daily broad spectrum sunscreen SPF 30+ to sun-exposed areas, reapply every 2 hours as needed. - Call for any changes  Seborrheic Keratoses - Stuck-on, waxy, tan-brown papules and/or plaques  - Benign-appearing - Discussed benign etiology and prognosis. - Observe - Call for any changes  Melanocytic Nevi - Tan-brown and/or pink-flesh-colored symmetric macules and papules - Benign appearing on exam today - Observation - Call clinic for new or changing moles - Recommend daily use of broad spectrum spf 30+ sunscreen to sun-exposed areas.   Hemangiomas - Red papules - Discussed benign nature - Observe - Call for any changes  Actinic Damage - Chronic condition, secondary to cumulative UV/sun exposure - diffuse scaly erythematous macules with underlying dyspigmentation - Recommend daily broad spectrum sunscreen SPF 30+ to sun-exposed areas, reapply every 2 hours as needed.  - Staying in the shade or wearing long sleeves, sun glasses (UVA+UVB protection) and wide brim hats (4-inch brim around the entire circumference of the hat) are also recommended for sun protection.  - Call for new or changing lesions.  Skin cancer screening performed today.  Return in about 3 months (around 02/24/2021) for recheck rosacea .  IRuthell Rummage, CMA, am acting  as scribe for Forest Gleason, MD.  Documentation: I have reviewed the above documentation for accuracy and completeness, and I agree with the above.  Forest Gleason, MD

## 2020-12-08 DIAGNOSIS — M170 Bilateral primary osteoarthritis of knee: Secondary | ICD-10-CM | POA: Diagnosis not present

## 2020-12-09 ENCOUNTER — Other Ambulatory Visit (HOSPITAL_COMMUNITY): Payer: Self-pay | Admitting: Orthopedic Surgery

## 2020-12-09 ENCOUNTER — Other Ambulatory Visit: Payer: Self-pay | Admitting: Orthopedic Surgery

## 2020-12-09 DIAGNOSIS — M25561 Pain in right knee: Secondary | ICD-10-CM

## 2020-12-15 ENCOUNTER — Ambulatory Visit: Admission: RE | Admit: 2020-12-15 | Payer: BC Managed Care – PPO | Source: Ambulatory Visit

## 2020-12-19 ENCOUNTER — Telehealth: Payer: Self-pay

## 2020-12-19 NOTE — Telephone Encounter (Signed)
That is fine. I would suggest metronidazole cream 0.75% cream twice a day as this usually works well and is also covered best by insurance. Please advise patient there are other options around $50 that may work a little better so if her insurance coverage is not good for this medicine and it is $50 or more, I would recommend she let us know and we can send a different option. Thank you!

## 2020-12-19 NOTE — Telephone Encounter (Signed)
Patient called regarding update to rosacea treatments.   Patient has been taking Doxycycline but states her stomach can not handle medication. Patient would like to go to topical therapy only.

## 2020-12-20 MED ORDER — METRONIDAZOLE 0.75 % EX CREA: TOPICAL_CREAM | Freq: Two times a day (BID) | CUTANEOUS | 1 refills | Status: DC

## 2020-12-20 NOTE — Telephone Encounter (Signed)
RX sent in and patient advised of information per Dr. Laurence Ferrari.

## 2020-12-21 DIAGNOSIS — M948X6 Other specified disorders of cartilage, lower leg: Secondary | ICD-10-CM | POA: Diagnosis not present

## 2020-12-21 DIAGNOSIS — M65861 Other synovitis and tenosynovitis, right lower leg: Secondary | ICD-10-CM | POA: Diagnosis not present

## 2020-12-21 DIAGNOSIS — M23321 Other meniscus derangements, posterior horn of medial meniscus, right knee: Secondary | ICD-10-CM | POA: Diagnosis not present

## 2020-12-21 DIAGNOSIS — M23331 Other meniscus derangements, other medial meniscus, right knee: Secondary | ICD-10-CM | POA: Diagnosis not present

## 2020-12-22 DIAGNOSIS — M1711 Unilateral primary osteoarthritis, right knee: Secondary | ICD-10-CM | POA: Diagnosis not present

## 2020-12-28 DIAGNOSIS — S83231A Complex tear of medial meniscus, current injury, right knee, initial encounter: Secondary | ICD-10-CM | POA: Diagnosis not present

## 2020-12-28 DIAGNOSIS — M6751 Plica syndrome, right knee: Secondary | ICD-10-CM | POA: Diagnosis not present

## 2020-12-28 DIAGNOSIS — S83271A Complex tear of lateral meniscus, current injury, right knee, initial encounter: Secondary | ICD-10-CM | POA: Diagnosis not present

## 2020-12-28 DIAGNOSIS — Y999 Unspecified external cause status: Secondary | ICD-10-CM | POA: Diagnosis not present

## 2020-12-28 DIAGNOSIS — X58XXXA Exposure to other specified factors, initial encounter: Secondary | ICD-10-CM | POA: Diagnosis not present

## 2020-12-28 DIAGNOSIS — M94261 Chondromalacia, right knee: Secondary | ICD-10-CM | POA: Diagnosis not present

## 2020-12-28 DIAGNOSIS — M659 Synovitis and tenosynovitis, unspecified: Secondary | ICD-10-CM | POA: Diagnosis not present

## 2020-12-28 DIAGNOSIS — G8918 Other acute postprocedural pain: Secondary | ICD-10-CM | POA: Diagnosis not present

## 2020-12-28 DIAGNOSIS — S83281A Other tear of lateral meniscus, current injury, right knee, initial encounter: Secondary | ICD-10-CM | POA: Diagnosis not present

## 2021-01-03 DIAGNOSIS — R262 Difficulty in walking, not elsewhere classified: Secondary | ICD-10-CM | POA: Diagnosis not present

## 2021-01-03 DIAGNOSIS — Z9889 Other specified postprocedural states: Secondary | ICD-10-CM | POA: Insufficient documentation

## 2021-01-03 DIAGNOSIS — M6281 Muscle weakness (generalized): Secondary | ICD-10-CM | POA: Diagnosis not present

## 2021-01-03 DIAGNOSIS — M25561 Pain in right knee: Secondary | ICD-10-CM | POA: Diagnosis not present

## 2021-01-03 DIAGNOSIS — M25461 Effusion, right knee: Secondary | ICD-10-CM | POA: Diagnosis not present

## 2021-01-10 DIAGNOSIS — M6281 Muscle weakness (generalized): Secondary | ICD-10-CM | POA: Diagnosis not present

## 2021-01-10 DIAGNOSIS — M25461 Effusion, right knee: Secondary | ICD-10-CM | POA: Diagnosis not present

## 2021-01-10 DIAGNOSIS — M25561 Pain in right knee: Secondary | ICD-10-CM | POA: Diagnosis not present

## 2021-01-10 DIAGNOSIS — R262 Difficulty in walking, not elsewhere classified: Secondary | ICD-10-CM | POA: Diagnosis not present

## 2021-01-16 LAB — COMPREHENSIVE METABOLIC PANEL
Albumin: 4.4 (ref 3.5–5.0)
Calcium: 9.6 (ref 8.7–10.7)
GFR calc non Af Amer: 99
Globulin: 2.8

## 2021-01-16 LAB — BASIC METABOLIC PANEL
BUN: 11 (ref 4–21)
CO2: 26 — AB (ref 13–22)
Chloride: 99 (ref 99–108)
Creatinine: 0.7 (ref 0.5–1.1)
Glucose: 109
Potassium: 4.3 (ref 3.4–5.3)
Sodium: 137 (ref 137–147)

## 2021-01-16 LAB — HEPATIC FUNCTION PANEL
ALT: 14 (ref 7–35)
AST: 15 (ref 13–35)
Alkaline Phosphatase: 78 (ref 25–125)
Bilirubin, Total: 0.4

## 2021-01-16 LAB — LIPID PANEL
Cholesterol: 146 (ref 0–200)
HDL: 60 (ref 35–70)
LDL Cholesterol: 70
Triglycerides: 87 (ref 40–160)

## 2021-01-16 LAB — HEMOGLOBIN A1C: Hemoglobin A1C: 6.3

## 2021-01-17 DIAGNOSIS — M25561 Pain in right knee: Secondary | ICD-10-CM | POA: Diagnosis not present

## 2021-01-17 DIAGNOSIS — R262 Difficulty in walking, not elsewhere classified: Secondary | ICD-10-CM | POA: Diagnosis not present

## 2021-01-17 DIAGNOSIS — M6281 Muscle weakness (generalized): Secondary | ICD-10-CM | POA: Diagnosis not present

## 2021-01-17 DIAGNOSIS — M25461 Effusion, right knee: Secondary | ICD-10-CM | POA: Diagnosis not present

## 2021-01-23 ENCOUNTER — Other Ambulatory Visit: Payer: Self-pay | Admitting: Family Medicine

## 2021-01-23 DIAGNOSIS — E7800 Pure hypercholesterolemia, unspecified: Secondary | ICD-10-CM

## 2021-01-23 NOTE — Telephone Encounter (Signed)
Notes to clinic:  medication filled by a historical provider  Review for refill    Requested Prescriptions  Pending Prescriptions Disp Refills   metFORMIN (GLUCOPHAGE) 500 MG tablet [Pharmacy Med Name: metFORMIN HCL 500 MG TABLET] 180 tablet     Sig: TAKE ONE TABLET BY [place]      Endocrinology:  Diabetes - Biguanides Failed - 01/23/2021  6:21 AM      Failed - HBA1C is between 0 and 7.9 and within 180 days    Hemoglobin A1C  Date Value Ref Range Status  01/06/2020 5.9  Final          Passed - Cr in normal range and within 360 days    Creatinine, Ser  Date Value Ref Range Status  09/06/2020 0.70 0.57 - 1.00 mg/dL Final          Passed - eGFR in normal range and within 360 days    GFR calc Af Amer  Date Value Ref Range Status  01/06/2020 103  Final   GFR calc non Af Amer  Date Value Ref Range Status  01/06/2020 89  Final   eGFR  Date Value Ref Range Status  09/06/2020 99 >59 mL/min/1.73 Final          Passed - Valid encounter within last 6 months    Recent Outpatient Visits           4 months ago Type 2 diabetes mellitus with diabetic polyneuropathy, without long-term current use of insulin ([place])   [facility] [place], Dionne Bucy, MD   5 months ago Acute non-recurrent frontal sinusitis   [facility] [place], Dionne Bucy, MD   12 months ago Type 2 diabetes mellitus with diabetic polyneuropathy, without long-term current use of insulin [facility])   [facility] [place], [place], [place]   1 year ago Generalized abdominal pain   [facility] [place], Dionne Bucy, MD   1 year ago Encounter for annual physical exam   [facility] [place], Dionne Bucy, MD       Future Appointments             In 2 months Bacigal[place], Dionne Bucy, MD [facility]              Signed Prescriptions Disp Refills   atorvastatin (LIPITOR) 20 MG tablet 90 tablet 0    Sig:  TAKE ONE TABLET BY MOUTH DAILY      Cardiovascular:  Antilipid - Statins Failed - 01/23/2021  6:21 AM      Failed - Total Cholesterol in normal range and within 360 days    Cholesterol, Total  Date Value Ref Range Status  07/31/2019 142 100 - 199 mg/dL Final   Cholesterol  Date Value Ref Range Status  01/06/2020 121 0 - 200 Final          Failed - LDL in normal range and within 360 days    LDL Chol Calc (NIH)  Date Value Ref Range Status  07/31/2019 65 0 - 99 mg/dL Final   LDL Cholesterol  Date Value Ref Range Status  01/06/2020 50  Final          Failed - HDL in normal range and within 360 days    HDL  Date Value Ref Range Status  01/06/2020 50 35 - 70 Final  07/31/2019 57 >39 mg/dL Final          Failed - Triglycerides  in normal range and within 360 days    Triglycerides  Date Value Ref Range Status  01/06/2020 57 40 - 160 Final          Passed - Patient is not pregnant      Passed - Valid encounter within last 12 months    Recent Outpatient Visits           4 months ago Type 2 diabetes mellitus with diabetic polyneuropathy, without long-term current use of insulin ([place])   [facility] [place], Dionne Bucy, MD   5 months ago Acute non-recurrent frontal sinusitis   [facility] [place], Dionne Bucy, MD   12 months ago Type 2 diabetes mellitus with diabetic polyneuropathy, without long-term current use of insulin [facility])   [place], [place], [place]   1 year ago Generalized abdominal pain   [facility] [place], Dionne Bucy, MD   1 year ago Encounter for annual physical exam   [facility] [place], Dionne Bucy, MD       Future Appointments             In 2 months Bacigalupo, Dionne Bucy, MD [facility] [place]

## 2021-02-08 ENCOUNTER — Ambulatory Visit: Payer: BC Managed Care – PPO | Admitting: Dermatology

## 2021-02-10 ENCOUNTER — Encounter: Payer: Self-pay | Admitting: Family Medicine

## 2021-02-15 ENCOUNTER — Telehealth: Payer: Self-pay

## 2021-02-15 NOTE — Telephone Encounter (Signed)
Pt advised.   Thanks,   -Ernst Cumpston  

## 2021-02-15 NOTE — Telephone Encounter (Signed)
Copied from [place] [id number]. Topic: General - Other >> Feb 15, 2021  1:43 PM Alanda Slim E wrote: Reason for CRM: Pts Co2 range was 26 and the supposed to be range is 13-22 / and under flag hers says A / please advise meaning/ pt is concerned about her result and what it could mean

## 2021-02-15 NOTE — Telephone Encounter (Signed)
In absence of other abnormalities, this is not concerning.

## 2021-03-01 ENCOUNTER — Other Ambulatory Visit: Payer: Self-pay | Admitting: Family Medicine

## 2021-03-01 DIAGNOSIS — F331 Major depressive disorder, recurrent, moderate: Secondary | ICD-10-CM

## 2021-03-01 NOTE — Telephone Encounter (Signed)
Patient called and asked is she still taking Trazodone that it was discontinued from her profile at the GI visit in March. She says she was told not to take it prior to the colonoscopy she had, but she is still taking the medication. I advised I will send this request to Dr. Brita Romp for refill.

## 2021-03-01 NOTE — Telephone Encounter (Signed)
Copied from [place]. Topic: Quick Communication - Rx Refill/Question >> Mar 01, 2021 10:14 AM Yvette Rack wrote: Medication: traZODone (DESYREL) 50 MG tablet   Has the patient contacted their pharmacy? Yes.   (Agent: If no, request that the patient contact the pharmacy for the refill.) (Agent: If yes, when and what did the pharmacy advise?)  Preferred Pharmacy (with phone number or street name): [id number] - [place], [place] - [place] Phone: [phone]   Fax: [phone]  Agent: Please be advised that RX refills may take up to 3 business days. We ask that you follow-up with your pharmacy.

## 2021-03-01 NOTE — Telephone Encounter (Signed)
Requested medication (s) are due for refill today: Yes  Requested medication (s) are on the active medication list: No  Last refill:  02/26/20  Future visit scheduled: Yes  Notes to clinic:  Unable to refill per protocol, Rx expired. Removed by another provider, patient still takes and asks for refill     Requested Prescriptions  Pending Prescriptions Disp Refills   traZODone (DESYREL) 50 MG tablet 90 tablet 5     Psychiatry: Antidepressants - Serotonin Modulator Passed - 03/01/2021 12:46 PM      Passed - Completed PHQ-2 or PHQ-9 in the last 360 days      Passed - Valid encounter within last 6 months    Recent Outpatient Visits           5 months ago Type 2 diabetes mellitus with diabetic polyneuropathy, without long-term current use of insulin [facility] [place] [facility])   [facility] [place], Dionne Bucy, MD   6 months ago Acute non-recurrent frontal sinusitis   [facility] [place], Dionne Bucy, MD   1 year ago Type 2 diabetes mellitus with diabetic polyneuropathy, without long-term current use of insulin [facility])   [facility] [place], [place], [place]   1 year ago Generalized abdominal pain   [facility] [place], Dionne Bucy, MD   1 year ago Encounter for annual physical exam   [facility], Dionne Bucy, MD

## 2021-03-02 MED ORDER — TRAZODONE HCL 50 MG PO TABS: 100.0000 mg | ORAL_TABLET | Freq: Every evening | ORAL | 5 refills | Status: DC | PRN

## 2021-03-07 ENCOUNTER — Other Ambulatory Visit: Payer: Self-pay

## 2021-03-07 ENCOUNTER — Ambulatory Visit (INDEPENDENT_AMBULATORY_CARE_PROVIDER_SITE_OTHER): Payer: BLUE CROSS/BLUE SHIELD

## 2021-03-07 ENCOUNTER — Ambulatory Visit (INDEPENDENT_AMBULATORY_CARE_PROVIDER_SITE_OTHER): Payer: BLUE CROSS/BLUE SHIELD | Admitting: Podiatry

## 2021-03-07 ENCOUNTER — Other Ambulatory Visit: Payer: Self-pay | Admitting: Podiatry

## 2021-03-07 DIAGNOSIS — M7752 Other enthesopathy of left foot: Secondary | ICD-10-CM

## 2021-03-07 DIAGNOSIS — M659 Synovitis and tenosynovitis, unspecified: Secondary | ICD-10-CM

## 2021-03-07 DIAGNOSIS — M779 Enthesopathy, unspecified: Secondary | ICD-10-CM

## 2021-03-07 MED ORDER — BETAMETHASONE SOD PHOS & ACET 6 (3-3) MG/ML IJ SUSP
3.0000 mg | Freq: Once | INTRAMUSCULAR | Status: AC
  Administered 2021-03-07: 3 mg via INTRA_ARTICULAR

## 2021-03-07 NOTE — Progress Notes (Signed)
   Subjective:  60 y.o. female presenting today as a new patient for evaluation of swelling with ankle pain to the left ankle.  This is been ongoing for approximately 2 weeks now.  Patient states that she recently got a new job at [employer] and this is when she noticed an exacerbation of her pain.  She states that she stands in the same place throughout her shift.  She presents for further treatment and evaluation   Past Medical History:  Diagnosis Date   Anxiety    Depression    Hypertension 2016   Sleep apnea    T2DM (type 2 diabetes mellitus) ([place]) 2016   diet controlled     Objective / Physical Exam:  General:  The patient is alert and oriented x3 in no acute distress. Dermatology:  Skin is warm, dry and supple bilateral lower extremities. Negative for open lesions or macerations. Vascular:  Palpable pedal pulses bilaterally.  Negative for erythema.  There is some moderate edema noted localized around the soft tissue area of the ankle capillary refill within normal limits. Neurological:  Epicritic and protective threshold grossly intact bilaterally.  Musculoskeletal Exam:  Pain on palpation to the anterior lateral medial aspects of the patient's left ankle. Mild edema noted. Range of motion within normal limits to all pedal and ankle joints bilateral. Muscle strength 5/5 in all groups bilateral.   Radiographic Exam:  Normal osseous mineralization. Joint spaces preserved. No fracture/dislocation/boney destruction.    Assessment: 1.  Ankle synovitis/edema left  Plan of Care:  1. Patient was evaluated. X-Rays reviewed.  2. Injection of 0.5 mL Celestone Soluspan injected in the patient's left ankle. 3.  Compression ankle sleeve dispensed.  Recommend wearing daily especially during her work shift. 4.  Continue wearing Brooks running shoes with insoles from [employer] running store 5.  Return to clinic as needed  *works at [employer], DPM [facility]  Dr. Edrick Kins, DPM    [address], [place] [address]                Office [phone]  Fax [phone]

## 2021-03-31 ENCOUNTER — Encounter: Payer: BC Managed Care – PPO | Admitting: Family Medicine

## 2021-04-19 ENCOUNTER — Other Ambulatory Visit: Payer: Self-pay

## 2021-04-19 ENCOUNTER — Encounter: Payer: Self-pay | Admitting: Obstetrics & Gynecology

## 2021-04-19 ENCOUNTER — Ambulatory Visit (INDEPENDENT_AMBULATORY_CARE_PROVIDER_SITE_OTHER): Payer: BLUE CROSS/BLUE SHIELD | Admitting: Obstetrics & Gynecology

## 2021-04-19 VITALS — BP 124/80 | Ht 67.0 in | Wt 205.0 lb

## 2021-04-19 DIAGNOSIS — N393 Stress incontinence (female) (male): Secondary | ICD-10-CM | POA: Insufficient documentation

## 2021-04-19 DIAGNOSIS — N814 Uterovaginal prolapse, unspecified: Secondary | ICD-10-CM

## 2021-04-19 DIAGNOSIS — Z01419 Encounter for gynecological examination (general) (routine) without abnormal findings: Secondary | ICD-10-CM

## 2021-04-19 NOTE — Patient Instructions (Signed)
PAP every three years Mammogram every year Colonoscopy every 10 years Labs yearly (with PCP)  Thank you for choosing [facility]. As part of our ongoing efforts to improve patient experience, we would appreciate your feedback. Please fill out the short survey that you will receive by mail or MyChart. Your opinion is important to us! - Dr. Kolter Reaver  Recommendations to boost your immunity to prevent illness such as viral flu and colds, including covid19, are as follows: Vitamin K2 and Vitamin D3. Take Vitamin K2 at 200-300 mcg daily (usually 2-3 pills daily of the over the counter formulation). Take Vitamin D3 at 3000-4000 U daily (usually 3-4 pills daily of the over the counter formulation). Studies show that these two at high normal levels in your system are very effective in keeping your immunity so strong and protective that you will be unlikely to contract viral illness such as those listed above.  Dr Kiante Petrovich  

## 2021-04-19 NOTE — Progress Notes (Signed)
HPI:      [name] is a 60 y.o. [phone] who LMP was in the past, she presents today for her annual examination.  The patient has no complaints today.  She continues to have stress incontinence w most activities; denies freq,urgency, nocturia. Replens has helped w vaginal dryness. The patient is sexually active. Herlast pap: approximate date 2021 and was normal and last mammogram: approximate date 2022 and was normal.  The patient does perform self breast exams.  There is notable family history of breast or ovarian cancer in her family. The patient is not taking hormone replacement therapy. Patient denies post-menopausal vaginal bleeding.   The patient has regular exercise: yes. The patient denies current symptoms of depression.    GYN Hx: Last Colonoscopy: 2022  . Normal.  Last DEXA:  never  ago.    PMHx: Past Medical History:  Diagnosis Date   Anxiety    Depression    Hypertension 2016   Sleep apnea    T2DM (type 2 diabetes mellitus) ([place]) 2016   diet controlled   Past Surgical History:  Procedure Laterality Date   [place]   for herniated disc, no hardware   BREAST BIOPSY Left 2009   benign   COLONOSCOPY WITH PROPOFOL N/A 09/26/2020   Procedure: COLONOSCOPY WITH PROPOFOL;  Surgeon: Jonathon Bellows, MD;  Location: [facility] ENDOSCOPY;  Service: Gastroenterology;  Laterality: N/A;   ECTOPIC PREGNANCY SURGERY     ESOPHAGEAL DILATION  2014   has had several i nthe past for achalasia   KNEE SURGERY     OPEN REDUCTION INTERNAL FIXATION (ORIF) DISTAL RADIAL FRACTURE Left 09/17/2017   Procedure: OPEN REDUCTION INTERNAL FIXATION (ORIF) DISTAL RADIAL FRACTURE;  Surgeon: Lovell Sheehan, MD;  Location: [facility] ORS;  Service: Orthopedics;  Laterality: Left;   Family History  Problem Relation Age of Onset   Thyroid cancer Mother    Anxiety disorder Mother    Depression Mother    Depression Father    Diabetes Father    COPD Father    Anxiety disorder Father    Hypothyroidism  Sister    Anxiety disorder Sister    Healthy Brother    Anxiety disorder Brother    Anxiety disorder Brother    Anxiety disorder Sister    Depression Sister    Drug abuse Paternal Aunt    Alcohol abuse Maternal Grandfather    Alcohol abuse Paternal Grandfather    Alcohol abuse Paternal Grandmother    Drug abuse Other    Social History   Tobacco Use   Smoking status: Former    Packs/day: 0.50    Years: 20.00    Pack years: 10.00    Types: Cigarettes    Quit date: 07/01/1994    Years since quitting: 26.8   Smokeless tobacco: Never  Vaping Use   Vaping Use: Never used  Substance Use Topics   Alcohol use: No   Drug use: No    Current Outpatient Medications:    atorvastatin (LIPITOR) 20 MG tablet, TAKE ONE TABLET BY MOUTH DAILY, Disp: 90 tablet, Rfl: 0   b complex vitamins capsule, Take 1 capsule by mouth daily., Disp: , Rfl:    DULoxetine (CYMBALTA) 60 MG capsule, Take 1 capsule (60 mg total) by mouth daily., Disp: 90 capsule, Rfl: 1   fluticasone (FLONASE) 50 MCG/ACT nasal spray, Place into the nose., Disp: , Rfl:    metFORMIN (GLUCOPHAGE) 500 MG tablet, TAKE ONE TABLET BY MOUTH TWICE A DAY  WITH MEALS, Disp: 180 tablet, Rfl: 1   Multiple Vitamin (MULTIVITAMIN) tablet, Take 1 tablet by mouth daily., Disp: , Rfl:    Omega-3 1000 MG CAPS, Take 1 tablet by mouth daily., Disp: , Rfl:    traZODone (DESYREL) 50 MG tablet, Take 2-3 tablets (100-150 mg total) by mouth at bedtime as needed for sleep., Disp: 90 tablet, Rfl: 5   GOLYTELY 236 g solution, SMARTSIG:Milliliter(s) By Mouth, Disp: , Rfl:    metroNIDAZOLE (METROCREAM) 0.75 % cream, Apply topically 2 (two) times daily. (Patient not taking: Reported on 04/19/2021), Disp: 45 g, Rfl: 1 Allergies: Bupropion and Oxycodone  Review of Systems  Constitutional:  Negative for chills, fever and malaise/fatigue.  HENT:  Negative for congestion, sinus pain and sore throat.   Eyes:  Negative for blurred vision and pain.  Respiratory:   Negative for cough and wheezing.   Cardiovascular:  Negative for chest pain and leg swelling.  Gastrointestinal:  Negative for abdominal pain, constipation, diarrhea, heartburn, nausea and vomiting.  Genitourinary:  Negative for dysuria, frequency, hematuria and urgency.  Musculoskeletal:  Positive for joint pain. Negative for back pain, myalgias and neck pain.  Skin:  Negative for itching and rash.  Neurological:  Negative for dizziness, tremors and weakness.  Endo/Heme/Allergies:  Does not bruise/bleed easily.  Psychiatric/Behavioral:  Negative for depression. The patient is not nervous/anxious and does not have insomnia.    Objective: BP 124/80   Ht 5\' 7"  (1.702 m)   Wt 205 lb (93 kg)   BMI 32.11 kg/m   Filed Weights   04/19/21 0951  Weight: 205 lb (93 kg)   Body mass index is 32.11 kg/m. Physical Exam Constitutional:      General: She is not in acute distress.    Appearance: She is well-developed.  Genitourinary:     Bladder, rectum and urethral meatus normal.     No lesions in the vagina.     Right Labia: No rash, tenderness or lesions.    Left Labia: No tenderness, lesions or rash.    No vaginal bleeding.      Right Adnexa: not tender and no mass present.    Left Adnexa: not tender and no mass present.    No cervical motion tenderness, friability, lesion or polyp.     Uterus is not enlarged.     No uterine mass detected.    Uterus exam comments: Gr 1 PROLAPSE.     Uterus is midaxial.     Bladder exam comments: Gr2 CYSTOCELE.     Pelvic exam was performed with patient in the lithotomy position.  Breasts:    Right: No mass, skin change or tenderness.     Left: No mass, skin change or tenderness.  HENT:     Head: Normocephalic and atraumatic. No laceration.     Right Ear: Hearing normal.     Left Ear: Hearing normal.     Mouth/Throat:     Pharynx: Uvula midline.  Eyes:     Pupils: Pupils are equal, round, and reactive to light.  Neck:     Thyroid: No  thyromegaly.  Cardiovascular:     Rate and Rhythm: Normal rate and regular rhythm.     Heart sounds: No murmur heard.   No friction rub. No gallop.  Pulmonary:     Effort: Pulmonary effort is normal. No respiratory distress.     Breath sounds: Normal breath sounds. No wheezing.  Abdominal:     General: Bowel sounds are normal. There  is no distension.     Palpations: Abdomen is soft.     Tenderness: There is no abdominal tenderness. There is no rebound.  Musculoskeletal:        General: Normal range of motion.     Cervical back: Normal range of motion and neck supple.  Neurological:     Mental Status: She is alert and oriented to person, place, and time.     Cranial Nerves: No cranial nerve deficit.  Skin:    General: Skin is warm and dry.  Psychiatric:        Judgment: Judgment normal.  Vitals reviewed.    Assessment: Annual Exam 1. Women's annual routine gynecological examination   2. GSI (genuine stress incontinence), female   3. Cystocele with uterine prolapse     Plan:            1.  Cervical Screening-  Pap smear schedule reviewed with patient  2. Breast screening- Exam annually and mammogram scheduled/ UTD  3. Colonoscopy UTD, every 10 years, Hemoccult testing after age 35  4. Labs managed by PCP  5. Counseling for hormonal therapy: none  6. GSI and pelvic floor weakening Discussed TVH BSO AR SLING as option Pessary alternative, but she is sexually active, and healthy for surgery if sx's become more problematic for her   7 Declines flu and covid vaccines Discussed importance and recommendations Vitamin support discussed    F/U  Return in about 1 year (around 04/19/2022) for Annual.  Barnett Applebaum, MD, [facility], [facility] 04/19/2021  10:27 AM

## 2021-04-26 ENCOUNTER — Telehealth: Payer: Self-pay

## 2021-04-26 DIAGNOSIS — G4733 Obstructive sleep apnea (adult) (pediatric): Secondary | ICD-10-CM

## 2021-04-26 NOTE — Telephone Encounter (Signed)
Copied from [place] [id number]. Topic: General - Other >> Apr 26, 2021  1:23 PM Yvette Rack wrote: Reason for CRM: Pt stated her c-pap machine has been recalled and she was originally told that it would be replaced but now she is being told it will not be replaced due to it being too old. Pt would like to know what she needs to do to get a replacement c-pap machine. Cb# [phone]

## 2021-04-27 NOTE — Telephone Encounter (Signed)
We may need to send a new Rx to her servicer.  Can we find copy of her old sleep study? We will need that

## 2021-04-28 NOTE — Telephone Encounter (Signed)
Patient [place]. Patient will try to get a copy of her sleep study.

## 2021-04-28 NOTE — Telephone Encounter (Signed)
Patient contacted previous specialist who conducted her sleep study and was informed they only keep 7 years worth of records therefore they do not have her sleep study on file. Patient unsure if she is going to have to redo her sleep study but currently using a c pap that has been re called. Patent would like a follow up call  # [phone]

## 2021-05-01 NOTE — Telephone Encounter (Signed)
Will likely need to repeat sleep study unfortunately.  Can continue to use current CPAP for the time being.  Ok to place referral for sleep study today. Want PSG (put this in the comments of the referral)

## 2021-05-09 ENCOUNTER — Other Ambulatory Visit: Payer: Self-pay | Admitting: Family Medicine

## 2021-05-09 DIAGNOSIS — E7800 Pure hypercholesterolemia, unspecified: Secondary | ICD-10-CM

## 2021-05-09 MED ORDER — ATORVASTATIN CALCIUM 20 MG PO TABS
20.0000 mg | ORAL_TABLET | Freq: Every day | ORAL | 0 refills | Status: DC
Start: 2021-05-09 — End: 2021-10-03

## 2021-05-09 NOTE — Telephone Encounter (Signed)
Requested Prescriptions  Pending Prescriptions Disp Refills  . atorvastatin (LIPITOR) 20 MG tablet 90 tablet 0    Sig: Take 1 tablet (20 mg total) by mouth daily.     Cardiovascular:  Antilipid - Statins Passed - 05/09/2021  5:51 PM      Passed - Total Cholesterol in normal range and within 360 days    Cholesterol, Total  Date Value Ref Range Status  07/31/2019 142 100 - 199 mg/dL Final   Cholesterol  Date Value Ref Range Status  01/16/2021 146 0 - 200 Final         Passed - LDL in normal range and within 360 days    LDL Chol Calc (NIH)  Date Value Ref Range Status  07/31/2019 65 0 - 99 mg/dL Final   LDL Cholesterol  Date Value Ref Range Status  01/16/2021 70  Final         Passed - HDL in normal range and within 360 days    HDL  Date Value Ref Range Status  01/16/2021 60 35 - 70 Final  07/31/2019 57 >39 mg/dL Final         Passed - Triglycerides in normal range and within 360 days    Triglycerides  Date Value Ref Range Status  01/16/2021 87 40 - 160 Final         Passed - Patient is not pregnant      Passed - Valid encounter within last 12 months    Recent Outpatient Visits          8 months ago Type 2 diabetes mellitus with diabetic polyneuropathy, without long-term current use of insulin ([facility])   [facility] [place], Dionne Bucy, MD   8 months ago Acute non-recurrent frontal sinusitis   [facility] [place], Dionne Bucy, MD   1 year ago Type 2 diabetes mellitus with diabetic polyneuropathy, without long-term current use of insulin [facility])   [place], [place], [place]   1 year ago Generalized abdominal pain   [facility] [place], Dionne Bucy, MD   1 year ago Encounter for annual physical exam   [facility] [place], Dionne Bucy, MD      Future Appointments            In 4 weeks Bacigalupo, Dionne Bucy, MD [facility]

## 2021-05-09 NOTE — Telephone Encounter (Signed)
Medication Refill - Medication: atorvastatin (LIPITOR) 20 MG tablet Has the patient contacted their pharmacy? No. This is new pharmacy for this medication  Preferred Pharmacy (with phone number or street name): [id number] - [place], [place] Has the patient been seen for an appointment in the last year OR does the patient have an upcoming appointment? Yes.    Agent: Please be advised that RX refills may take up to 3 business days. We ask that you follow-up with your pharmacy.

## 2021-05-31 ENCOUNTER — Ambulatory Visit: Payer: Self-pay

## 2021-05-31 ENCOUNTER — Telehealth: Payer: Self-pay | Admitting: Family

## 2021-05-31 DIAGNOSIS — J019 Acute sinusitis, unspecified: Secondary | ICD-10-CM

## 2021-05-31 MED ORDER — AMOXICILLIN-POT CLAVULANATE 875-125 MG PO TABS: 1.0000 | ORAL_TABLET | Freq: Two times a day (BID) | ORAL | 0 refills | Status: DC

## 2021-05-31 NOTE — Progress Notes (Signed)

## 2021-05-31 NOTE — Telephone Encounter (Signed)
Patient called in looking to be seen but nothing available sinus congestion, ear pressure, sinus drainage and headache. Been taking Mucinex DM and doing the [place] but not helping much lots of mucus upon blowing nose. Please call patient with instructions at Ph# [phone]

## 2021-05-31 NOTE — Telephone Encounter (Signed)
Pt. Started having sinus pain and congestion 10 days ago. Has bloody, green discharge from nose. Has ear pressure and headache. No availability in the practice. Pt. Will try My Chart e-visit.    Reason for Disposition  [1] SEVERE pain AND [2] not improved 2 hours after pain medicine  Answer Assessment - Initial Assessment Questions 1. LOCATION: "Where does it hurt?"      Headache 2. ONSET: "When did the sinus pain start?"  (e.g., hours, days)      10 days ago 3. SEVERITY: "How bad is the pain?"   (Scale 1-10; mild, moderate or severe)   - MILD (1-3): doesn't interfere with normal activities    - MODERATE (4-7): interferes with normal activities (e.g., work or school) or awakens from sleep   - SEVERE (8-10): excruciating pain and patient unable to do any normal activities        Discomfort - 5  4. RECURRENT SYMPTOM: "Have you ever had sinus problems before?" If Yes, ask: "When was the last time?" and "What happened that time?"      Yes 5. NASAL CONGESTION: "Is the nose blocked?" If Yes, ask: "Can you open it or must you breathe through your mouth?"     No 6. NASAL DISCHARGE: "Do you have discharge from your nose?" If so ask, "What color?"     Green with blood 7. FEVER: "Do you have a fever?" If Yes, ask: "What is it, how was it measured, and when did it start?"      No 8. OTHER SYMPTOMS: "Do you have any other symptoms?" (e.g., sore throat, cough, earache, difficulty breathing)     Ear pressure 9. PREGNANCY: "Is there any chance you are pregnant?" "When was your last menstrual period?"     No  Protocols used: Sinus Pain or Congestion-A-AH

## 2021-06-01 NOTE — Telephone Encounter (Signed)
Noted  

## 2021-06-06 ENCOUNTER — Encounter: Payer: Self-pay | Admitting: Family Medicine

## 2021-06-06 ENCOUNTER — Other Ambulatory Visit: Payer: Self-pay

## 2021-06-06 ENCOUNTER — Ambulatory Visit (INDEPENDENT_AMBULATORY_CARE_PROVIDER_SITE_OTHER): Payer: Self-pay | Admitting: Family Medicine

## 2021-06-06 VITALS — BP 116/76 | HR 76 | Temp 98.0°F | Resp 16 | Wt 207.4 lb

## 2021-06-06 DIAGNOSIS — G4733 Obstructive sleep apnea (adult) (pediatric): Secondary | ICD-10-CM

## 2021-06-06 DIAGNOSIS — M25572 Pain in left ankle and joints of left foot: Secondary | ICD-10-CM

## 2021-06-06 DIAGNOSIS — F411 Generalized anxiety disorder: Secondary | ICD-10-CM

## 2021-06-06 DIAGNOSIS — E669 Obesity, unspecified: Secondary | ICD-10-CM

## 2021-06-06 DIAGNOSIS — E1159 Type 2 diabetes mellitus with other circulatory complications: Secondary | ICD-10-CM

## 2021-06-06 DIAGNOSIS — F3341 Major depressive disorder, recurrent, in partial remission: Secondary | ICD-10-CM

## 2021-06-06 DIAGNOSIS — E785 Hyperlipidemia, unspecified: Secondary | ICD-10-CM

## 2021-06-06 DIAGNOSIS — E1169 Type 2 diabetes mellitus with other specified complication: Secondary | ICD-10-CM

## 2021-06-06 DIAGNOSIS — I152 Hypertension secondary to endocrine disorders: Secondary | ICD-10-CM

## 2021-06-06 DIAGNOSIS — Z6832 Body mass index (BMI) 32.0-32.9, adult: Secondary | ICD-10-CM

## 2021-06-06 DIAGNOSIS — E1142 Type 2 diabetes mellitus with diabetic polyneuropathy: Secondary | ICD-10-CM

## 2021-06-06 LAB — POCT GLYCOSYLATED HEMOGLOBIN (HGB A1C)
Est. average glucose Bld gHb Est-mCnc: 134
Hemoglobin A1C: 6.3 % — AB (ref 4.0–5.6)

## 2021-06-06 MED ORDER — MELOXICAM 15 MG PO TABS: 15.0000 mg | ORAL_TABLET | Freq: Every day | ORAL | 0 refills | Status: DC

## 2021-06-06 NOTE — Assessment & Plan Note (Signed)
Chronic and well-controlled Continue Cymbalta at current dose

## 2021-06-06 NOTE — Assessment & Plan Note (Signed)
Well controlled with A1c 6.3 Continue current medications UTD on vaccines (discussed prevnar 20), eye exam (will send ROI), foot exam On Statin Discussed diet and exercise F/u in 6 months

## 2021-06-06 NOTE — Assessment & Plan Note (Signed)
Her CPAP has been recalled, but she is unable to get repeat sleep study until January She does have a scheduled an upcoming is having your new CPAP at that time

## 2021-06-06 NOTE — Assessment & Plan Note (Signed)
Well controlled with lifestyle management No medications Recheck metabolic panel F/u in 6 months

## 2021-06-06 NOTE — Assessment & Plan Note (Signed)
New problem Started after being on her feet all day at a new job Ankle injections seem to make it worse, which might have flared up her neuropathy Start meloxicam x2 weeks Rest, ice, elevation Consider referral to sports med if not improving

## 2021-06-06 NOTE — Assessment & Plan Note (Signed)
Previously well controlled with goal LDL less than 70 Continue Lipitor at current dose Recheck CMP and FLP at CPE

## 2021-06-06 NOTE — Assessment & Plan Note (Signed)
Chronic and well-controlled Continue Cymbalta at current dose Encourage therapy

## 2021-06-06 NOTE — Assessment & Plan Note (Signed)
Discussed importance of healthy weight management Discussed diet and exercise  

## 2021-06-06 NOTE — Assessment & Plan Note (Signed)
Chronic and slightly exacerbated after recent ankle injection  continue B complex Has declined gabapentin in the past Recheck B12 at next visit

## 2021-06-06 NOTE — Progress Notes (Signed)
Established patient visit   Patient: Angelica Medina   DOB: Mar 01, 1961   60 y.o. Female  MRN: [id number] Visit Date: 06/06/2021  Today's healthcare provider: Lavon Paganini, MD   Chief Complaint  Patient presents with   Diabetes   Hypertension   Hyperlipidemia   Foot Pain   Subjective    HPI  Diabetes Mellitus Type II, follow-up  Lab Results  Component Value Date   HGBA1C 6.3 (A) 06/06/2021   HGBA1C 6.3 01/16/2021   HGBA1C 5.9 01/06/2020   Last seen for diabetes 9 months ago.  Management since then includes continuing the same treatment. She reports excellent compliance with treatment. She is not having side effects.   Home blood sugar records: fasting range: 100-120s  Episodes of hypoglycemia? No    Current insulin regiment: none Most Recent Eye Exam: UTD [place]  --------------------------------------------------------------------------------------------------- Hypertension, follow-up  BP Readings from Last 3 Encounters:  06/06/21 116/76  04/19/21 124/80  09/26/20 133/74   Wt Readings from Last 3 Encounters:  06/06/21 207 lb 6.4 oz (94.1 kg)  04/19/21 205 lb (93 kg)  09/26/20 193 lb (87.5 kg)     She was last seen for hypertension 9 months ago.  BP at that visit was 133/74. Management since that visit includes no changes. She reports excellent compliance with treatment. She is not having side effects.  She is not exercising. She is adherent to low salt diet.   Outside blood pressures are not being checked.  She does not smoke.  Use of agents associated with hypertension: none.   --------------------------------------------------------------------------------------------------- Lipid/Cholesterol, follow-up  Last Lipid Panel: Lab Results  Component Value Date   CHOL 146 01/16/2021   LDLCALC 70 01/16/2021   HDL 60 01/16/2021   TRIG 87 01/16/2021    She was last seen for this 9 months ago.  Management since that visit includes no  changes.  She reports excellent compliance with treatment. She is not having side effects.   Symptoms: No appetite changes No foot ulcerations  No chest pain No chest pressure/discomfort  No dyspnea No orthopnea  No fatigue Yes lower extremity edema  No palpitations No paroxysmal nocturnal dyspnea  No nausea Yes numbness or tingling of extremity  No polydipsia No polyuria  No speech difficulty No syncope   She is following a Regular diet. Current exercise: none  Last metabolic panel Lab Results  Component Value Date   GLUCOSE 106 (H) 09/06/2020   NA 137 01/16/2021   K 4.3 01/16/2021   BUN 11 01/16/2021   CREATININE 0.7 01/16/2021   EGFR 99 09/06/2020   GFRNONAA 99 01/16/2021   CALCIUM 9.6 01/16/2021   AST 15 01/16/2021   ALT 14 01/16/2021   The 10-year ASCVD risk score (Arnett DK, et al., 2019) is: 3.9%  ---------------------------------------------------------------------------------------------------  L ankle pain after standing in place at a new job. Went to podiatry and got cortisone shot in L ankle. Still hurts and feels like the needle is in it.    Medications: Outpatient Medications Prior to Visit  Medication Sig   amoxicillin-clavulanate (AUGMENTIN) 875-125 MG tablet Take 1 tablet by mouth 2 (two) times daily.   atorvastatin (LIPITOR) 20 MG tablet Take 1 tablet (20 mg total) by mouth daily.   b complex vitamins capsule Take 1 capsule by mouth daily.   DULoxetine (CYMBALTA) 60 MG capsule Take 1 capsule (60 mg total) by mouth daily.   fluticasone (FLONASE) 50 MCG/ACT nasal spray Place into the nose.   metFORMIN (  GLUCOPHAGE) 500 MG tablet TAKE ONE TABLET BY MOUTH TWICE A DAY WITH MEALS   Multiple Vitamin (MULTIVITAMIN) tablet Take 1 tablet by mouth daily.   Omega-3 1000 MG CAPS Take 1 tablet by mouth daily.   traZODone (DESYREL) 50 MG tablet Take 2-3 tablets (100-150 mg total) by mouth at bedtime as needed for sleep.   No facility-administered medications prior  to visit.    Review of Systems  Constitutional:  Negative for appetite change and fatigue.  Respiratory:  Negative for chest tightness and shortness of breath.   Cardiovascular:  Positive for leg swelling. Negative for chest pain and palpitations.  Musculoskeletal:  Positive for myalgias.       Objective    BP 116/76 (BP Location: Right Arm, Patient Position: Sitting, Cuff Size: Large)   Pulse 76   Temp 98 F (36.7 C) (Temporal)   Resp 16   Wt 207 lb 6.4 oz (94.1 kg)   SpO2 96%   BMI 32.48 kg/m     Physical Exam Vitals reviewed.  Constitutional:      General: She is not in acute distress.    Appearance: Normal appearance. She is well-developed. She is not diaphoretic.  HENT:     Head: Normocephalic and atraumatic.  Eyes:     General: No scleral icterus.    Conjunctiva/sclera: Conjunctivae normal.  Neck:     Thyroid: No thyromegaly.  Cardiovascular:     Rate and Rhythm: Normal rate and regular rhythm.     Pulses: Normal pulses.     Heart sounds: Normal heart sounds. No murmur heard. Pulmonary:     Effort: Pulmonary effort is normal. No respiratory distress.     Breath sounds: Normal breath sounds. No wheezing, rhonchi or rales.  Musculoskeletal:     Cervical back: Neck supple.     Right lower leg: No edema.     Left lower leg: No edema.     Comments: L ankle stable, but some mild diffuse TTP and swelling. No erythema  Lymphadenopathy:     Cervical: No cervical adenopathy.  Skin:    General: Skin is warm and dry.     Findings: No rash.  Neurological:     Mental Status: She is alert and oriented to person, place, and time. Mental status is at baseline.  Psychiatric:        Mood and Affect: Mood normal.        Behavior: Behavior normal.      Results for orders placed or performed in visit on 06/06/21  POCT glycosylated hemoglobin (Hb A1C)  Result Value Ref Range   Hemoglobin A1C 6.3 (A) 4.0 - 5.6 %   Est. average glucose Bld gHb Est-mCnc 134      Assessment & Plan     Problem List Items Addressed This Visit       Cardiovascular and Mediastinum   Hypertension associated with diabetes ([place])    Well controlled with lifestyle management No medications Recheck metabolic panel F/u in 6 months         Respiratory   OSA (obstructive sleep apnea)    Her CPAP has been recalled, but she is unable to get repeat sleep study until January She does have a scheduled an upcoming is having your new CPAP at that time        Endocrine   T2DM (type 2 diabetes mellitus) ([place]) - Primary    Well controlled with A1c 6.3 Continue current medications UTD on vaccines (discussed prevnar 20),  eye exam (will send ROI), foot exam On Statin Discussed diet and exercise F/u in 6 months       Relevant Orders   POCT glycosylated hemoglobin (Hb A1C) (Completed)   Hyperlipidemia associated with type 2 diabetes mellitus (HCC)    Previously well controlled with goal LDL less than 70 Continue Lipitor at current dose Recheck CMP and FLP at CPE      Diabetic neuropathy (HCC)    Chronic and slightly exacerbated after recent ankle injection  continue B complex Has declined gabapentin in the past Recheck B12 at next visit        Other   MDD (major depressive disorder), recurrent severe, without psychosis ([place])    Chronic and well-controlled Continue Cymbalta at current dose Encourage therapy      GAD (generalized anxiety disorder)    Chronic and well-controlled Continue Cymbalta at current dose      Obesity    Discussed importance of healthy weight management Discussed diet and exercise       Acute left ankle pain    New problem Started after being on her feet all day at a new job Ankle injections seem to make it worse, which might have flared up her neuropathy Start meloxicam x2 weeks Rest, ice, elevation Consider referral to sports med if not improving        Return in about 6 months (around 12/05/2021) for CPE.      I, Lavon Paganini, MD, have reviewed all documentation for this visit. The documentation on 06/06/21 for the exam, diagnosis, procedures, and orders are all accurate and complete.   Presli Fanguy, Dionne Bucy, MD, MPH [facility]

## 2021-07-18 ENCOUNTER — Other Ambulatory Visit: Payer: Self-pay

## 2021-07-18 ENCOUNTER — Ambulatory Visit (INDEPENDENT_AMBULATORY_CARE_PROVIDER_SITE_OTHER): Payer: BLUE CROSS/BLUE SHIELD | Admitting: Neurology

## 2021-07-18 ENCOUNTER — Encounter: Payer: Self-pay | Admitting: Neurology

## 2021-07-18 VITALS — BP 140/82 | HR 72 | Ht 67.0 in | Wt 212.5 lb

## 2021-07-18 DIAGNOSIS — Z9989 Dependence on other enabling machines and devices: Secondary | ICD-10-CM

## 2021-07-18 DIAGNOSIS — G4733 Obstructive sleep apnea (adult) (pediatric): Secondary | ICD-10-CM | POA: Diagnosis not present

## 2021-07-18 NOTE — Patient Instructions (Signed)

## 2021-07-18 NOTE — Progress Notes (Addendum)
SLEEP MEDICINE CLINIC    Provider:  Larey Seat, MD  Primary Care Physician:  Virginia Crews, MD [address] [place] [address]     Referring Provider: Virginia Crews, [place],  [place] [address]          Chief Complaint according to patient   Patient presents with:     New Patient (Initial Visit)           HISTORY OF PRESENT ILLNESS:  Angelica Medina is a 61 y.o. Caucasian female patient seen upon referral on 07/18/2021 from PCP . Chief concern according to patient :   Pt is referred by Dr. Lavon Paganini for OSA on CPAP. Last SS done 10 years ago in [place].  Pt reports her current CPAP Hardin Negus) machine has been recalled and too old ' was never able to get it replaced". Here today to get a new CPAP. Pt has continued to use CPAP nightly. Has helped with her snoring and feeling overall well rested.  She uses her CPAP every night- nobody told her to bring it.     I have the pleasure of seeing Angelica Medina today, a right -handed White or Caucasian female with a possible sleep disorder.  She has a  has a past medical history of Anxiety, Depression, Hypertension (2016), Sleep apnea, and T2DM (type 2 diabetes mellitus) ([place]) (2016). post tonsillectomy, goiter, thyroidism. 11-2020 knee surgery, arthroscopy. Rare GERD.    The patient had the first sleep study in the year 2010 in [place]- and she was told it was severe.     Sleep relevant medical history: may snore with CPAP when in supine- no nocturia.    Family medical /sleep history: brother on CPAP with OSA, i    Social history:  Patient is retired from school bus driving, and now is caretaker of her granddaughter.   and lives in a household with spouse- one dog.  Tobacco use: quit 30 years ago.  ETOH use 1/month or less.  Caffeine intake in form of Coffee( /) Soda( /) Tea ( /) or energy drinks. Regular exercise in form of walking. Has been less active since knee surgery.      Sleep habits are as follows: The patient's dinner time is between 5 PM. The patient goes to bed at 9 PM and continues to sleep for 7 hours. The preferred sleep position is sideways, with the support of 1 pillow. Dreams are reportedly frequent.  5.30-6 AM is the usual rise time. The patient wakes up spontaneously. She reports not feeling refreshed or restored in AM if she hasn't used CPAP- CPAP dependent-  , with symptoms such as dry mouth, and residual fatigue.  Naps are not taken.    Review of Systems: Out of a complete 14 system review, the patient complains of only the following symptoms, and all other reviewed systems are negative.:   Insomnia when not taking TRAZODONE -  early moring arousals.  None on CPAP . GERD is rare,    Numbness in both feet, ankle edema in the left foot  How likely are you to doze in the following situations: 0 = not likely, 1 = slight chance, 2 = moderate chance, 3 = high chance   Sitting and Reading? Watching Television? Sitting inactive in a public place (theater or meeting)? As a passenger in a car for an hour without a break? Lying down in the afternoon when circumstances permit? Sitting  and talking to someone? Sitting quietly after lunch without alcohol? In a car, while stopped for a few minutes in traffic?   Total = 04 / 24 points   FSS endorsed at 23/ 63 points.   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  Tobacco Use   Smoking status: Former    Packs/day: 0.50    Years: 20.00    Pack years: 10.00    Types: Cigarettes    Quit date: 07/01/1994    Years since quitting: 27.0   Smokeless tobacco: Never  Vaping Use   Vaping Use: Never used  Substance and Sexual Activity   Alcohol use: No   Drug use: No   Sexual activity: Not  Currently  Other Topics Concern   Not on file  Social History Narrative   Lives at home with husband   Right handed   Caffeine: 1 iced tea a week   Social Determinants of Health   Financial Resource Strain: Not on file  Food Insecurity: Not on file  Transportation Needs: Not on file  Physical Activity: Not on file  Stress: Not on file  Social Connections: Not on file    Family History  Problem Relation Age of Onset   Thyroid cancer Mother    Anxiety disorder Mother    Depression Mother    Depression Father    Diabetes Father    COPD Father    Anxiety disorder Father    Hypothyroidism Sister    Anxiety disorder Sister    Healthy Brother    Anxiety disorder Brother    Anxiety disorder Brother    Anxiety disorder Sister    Depression Sister    Drug abuse Paternal Aunt    Alcohol abuse Maternal Grandfather    Alcohol abuse Paternal Grandfather    Alcohol abuse Paternal Grandmother    Drug abuse Other     Past Medical History:  Diagnosis Date   Anxiety    Depression    Hypertension 2016   Sleep apnea    T2DM (type 2 diabetes mellitus) ([place]) 2016   diet controlled    Past Surgical History:  Procedure Laterality Date   BACK SURGERY  1996   for herniated disc, no hardware   BREAST BIOPSY Left 2009   benign   COLONOSCOPY WITH PROPOFOL N/A 09/26/2020   Procedure: COLONOSCOPY WITH PROPOFOL;  Surgeon: Jonathon Bellows, MD;  Location: [facility] ENDOSCOPY;  Service: Gastroenterology;  Laterality: N/A;   ECTOPIC PREGNANCY SURGERY     ESOPHAGEAL DILATION  2014   has had several i nthe past for achalasia   KNEE SURGERY     OPEN REDUCTION INTERNAL FIXATION (ORIF) DISTAL RADIAL FRACTURE Left 09/17/2017   Procedure: OPEN REDUCTION INTERNAL FIXATION (ORIF) DISTAL RADIAL FRACTURE;  Surgeon: Lovell Sheehan, MD;  Location: [facility] ORS;  Service: Orthopedics;  Laterality: Left;     Current Outpatient Medications on File Prior to Visit  Medication Sig Dispense Refill   atorvastatin  (LIPITOR) 20 MG tablet Take 1 tablet (20 mg total) by mouth daily. 90 tablet 0   b complex vitamins capsule Take 1 capsule by mouth daily.     DULoxetine (CYMBALTA) 60 MG capsule Take 1 capsule (60 mg total) by mouth daily. 90 capsule 1  fluticasone (FLONASE) 50 MCG/ACT nasal spray Place into the nose.     meloxicam (MOBIC) 15 MG tablet Take 1 tablet (15 mg total) by mouth daily. 30 tablet 0   metFORMIN (GLUCOPHAGE) 500 MG tablet TAKE ONE TABLET BY MOUTH TWICE A DAY WITH MEALS 180 tablet 1   Multiple Vitamin (MULTIVITAMIN) tablet Take 1 tablet by mouth daily.     Omega-3 1000 MG CAPS Take 1 tablet by mouth daily.     traZODone (DESYREL) 50 MG tablet Take 2-3 tablets (100-150 mg total) by mouth at bedtime as needed for sleep. 90 tablet 5   No current facility-administered medications on file prior to visit.    Allergies  Allergen Reactions   Bupropion Anxiety and Other (See Comments)   Oxycodone Itching    Physical exam:  Today's Vitals   07/18/21 0939  BP: 140/82  Pulse: 72  Weight: 212 lb 8 oz (96.4 kg)  Height: 5\' 7"  (1.702 m)   Body mass index is 33.28 kg/m.   Wt Readings from Last 3 Encounters:  07/18/21 212 lb 8 oz (96.4 kg)  06/06/21 207 lb 6.4 oz (94.1 kg)  04/19/21 205 lb (93 kg)     Ht Readings from Last 3 Encounters:  07/18/21 5\' 7"  (1.702 m)  04/19/21 5\' 7"  (1.702 m)  09/26/20 5\' 7"  (1.702 m)      General: The patient is awake, alert and appears not in acute distress. The patient is well groomed. Head: Normocephalic, atraumatic.  Neck is supple. Mallampati 1,  neck circumference:15 inches .  Nasal airflow  patent.  Retrognathia is seen.  Dental status: front teeth  bridge  Cardiovascular:  Regular rate and cardiac rhythm by pulse,  without distended neck veins. Respiratory: Lungs are clear to auscultation.  Skin:  Without evidence of ankle edema, or rash. Trunk: The patient's posture is erect.   Neurologic exam : The patient is awake and alert,  oriented to place and time.   Memory subjective described as intact.  Attention span & concentration ability appears normal.  Speech is fluent,  without  dysarthria, dysphonia or aphasia.  Mood and affect are appropriate.   Cranial nerves: no loss of smell or taste reported - unvaccinated for COVID and no infection history.  Pupils are equal and briskly reactive to light. Funduscopic exam: .  Extraocular movements in vertical and horizontal planes were intact and without nystagmus. No Diplopia. Visual fields by finger perimetry are intact. Hearing was intact to soft voice and finger rubbing.    Facial sensation intact to fine touch.  Facial motor strength is symmetric and tongue and uvula move midline.  Neck ROM : rotation, tilt and flexion extension were normal for age and shoulder shrug was symmetrical.    Motor exam:  Symmetric bulk, tone and ROM.   Normal tone without cog- wheeling, symmetric grip strength .   Sensory:  Fine touch, pinprick and vibration were  normal in her upper extremities, vibration decreased in both feet.   Proprioception tested in the upper extremities was normal.   Coordination: Rapid alternating movements  were of normal speed.  The Finger-to-nose maneuver was deferred-  without evidence of ataxia, dysmetria or tremor.   Gait and station: Patient could rise unassisted from a seated position, walked without assistive device.  Stance is of normal width/ base and the patient turned with 3 steps.  Toe and heel walk were deferred.  Deep tendon reflexes: in the  upper and lower extremities are symmetric and  intact. 1 plus.  Babinski response was deferred .       After spending a total time of  40  minutes face to face and additional time for physical and neurologic examination, review of laboratory studies,  personal review of imaging studies, reports and results of other testing and review of referral information / records as far as provided in visit, I have  established the following assessments:  Unfortunately , I have not been able to see baseline study , which is her only sleep study, nor the CPAP machine to investigate current settings, she uses a nasal mask- small size. Hose in front.   1) CPAP dependent patient, here to get a new machine and baseline for apnea.  2) She has had insomnia, excessive daytime sleepiness before CPAP, loud snorer.  3) OSA by history    My Plan is to proceed with:  1)HST to get baseline , new machine will be autotitration with 2 cm EPR an new nasal mask.  No Phillips device requested.  [name]     I would like to thank Virginia Crews, [place],  [place] [address] for allowing me to meet with and to take care of this pleasant patient.   In short, Angelica Medina is presenting with CPAP dependency. I plan to follow up either personally or through our NP within 2-4 month.   CC: I will share my notes with PCP .  Electronically signed by: Larey Seat, MD 07/18/2021 10:09 AM  [facility] Board certified by The [employer] of Sleep Medicine and Diplomate of the [employer] of Sleep Medicine. Board certified In Neurology through the [employer], Fellow of the [employer] of Neurology. Medical Director of [facility].

## 2021-08-14 ENCOUNTER — Telehealth: Payer: Self-pay | Admitting: Neurology

## 2021-08-14 NOTE — Telephone Encounter (Signed)
Called pt to let her know that we are still waiting on Insurance authorization and we will be giving her a call as soon as we hear back from the insurance company.

## 2021-08-14 NOTE — Telephone Encounter (Signed)
At 11:58 pt left a vm stating she has not been contacted by anyone re: the scheduling of her sleep study. Please call pt to schedule

## 2021-08-28 ENCOUNTER — Other Ambulatory Visit (HOSPITAL_COMMUNITY): Payer: Self-pay | Admitting: Family Medicine

## 2021-08-28 DIAGNOSIS — Z1231 Encounter for screening mammogram for malignant neoplasm of breast: Secondary | ICD-10-CM

## 2021-08-31 ENCOUNTER — Other Ambulatory Visit: Payer: Self-pay | Admitting: Family Medicine

## 2021-08-31 MED ORDER — DULOXETINE HCL 60 MG PO CPEP: 60.0000 mg | ORAL_CAPSULE | Freq: Every day | ORAL | 1 refills | Status: DC

## 2021-08-31 NOTE — Telephone Encounter (Signed)
[facility] faxed refill request for the following medications: ? ?DULoxetine (CYMBALTA) 60 MG capsule  ? ?Please advise. ? ?

## 2021-09-06 ENCOUNTER — Ambulatory Visit: Payer: BLUE CROSS/BLUE SHIELD | Admitting: Neurology

## 2021-09-06 DIAGNOSIS — G4733 Obstructive sleep apnea (adult) (pediatric): Secondary | ICD-10-CM | POA: Diagnosis not present

## 2021-09-06 DIAGNOSIS — Z9989 Dependence on other enabling machines and devices: Secondary | ICD-10-CM

## 2021-09-07 NOTE — Progress Notes (Signed)
? ? ? ?  ?  ?[facility] ?  ?HOME SLEEP TEST REPORT ( by Watch PAT)   ?STUDY DATE:  09-07-2021 ?  ?ORDERING CLINICIAN: Larey Seat, MD  ?REFERRING CLINICIAN: Dr Judye Bos  ?  ?CLINICAL INFORMATION/HISTORY: referral on 07/18/2021 from PCP . ? Pt is referred by Dr. Lavon Paganini for OSA on CPAP. Last sleep study was done 10 years ago in [place]. Her current CPAP Hardin Negus) machine has been recalled and is "too old ' was never able to get it replaced". Here today to get a new CPAP. Pt has continued to use CPAP nightly which has helped with her snoring and feeling well rested.  ?She uses her CPAP every night- claims nobody told her to bring it.  ? ? Angelica Medina has a past medical history of Anxiety, Depression, Hypertension (2016), OSA on CPAP and T2DM (type 2 diabetes mellitus) ([place]) (2016). She is status  post tonsillectomy, goiter, hypothyroidism. In 11-2020 she had knee surgery, arthroscopy. Rare GERD.  ?Plan : HST to get baseline , new machine will be autotitration with 2 cm EPR an new nasal mask.  ?No Phillips device requested. ?  ?[name]  ?  ? ? ?  ?Epworth sleepiness score: 4 /24. On CPAP ?  ?BMI: 33.2 kg/m? ?  ?Neck Circumference: 15" ?  ?FINDINGS: ?  ?Sleep Summary: ?  ?Total Recording Time (hours, min): Total recording time for this home sleep test by watch pat was 9 hours and 43 minutes of which 9 hours and 3 minutes was a total calculated sleep time.  REM sleep amounted to 22% of total sleep time.     ?  ?Respiratory Indices: ?  ?Calculated pAHI (per hour):    The overall apnea hypopnea index was 45.4/h with not much differentiation between REM and non-REM sleep.                       ?  ?Positional AHI and snoring data were not obtained.  Apparently the chest wall electrode failed to detect these important apnea modifiers.                                                 ?  ?Oxygen Saturation Statistics: ?  ?Oxygen Saturation Range (%):     Varied between a nadir of 85 and a maximum of 99% with a mean oxygenation of 94%.                                 ?  ?O2 Saturation (minutes) <89%:    3.2 minutes     ?  ?Pulse Rate Statistics:       ?  ?Pulse Range:   Varied between 50 and 92 bpm with a mean heart rate of 62 bpm.            ?  ?IMPRESSION:  This HST confirms the presence of severe sleep apnea that appears not to be REM sleep dependent, there was also no significant associated hypoxia.  There was no indication of central apnea components.  This apnea form would be best addressed with continuous positive airway pressure or BiPAP.  Since the patient is used to BiPAP-CPAP I would certainly feel that  this is the best treatment rather than an inspire or dental device. ?Should the patient want to further investigate alternatives to CPAP she would need to repeat a home sleep test with valid snoring and positional data.  If her apnea is strictly positional it may be reducible by a significant proportion and may at that time be amendable to a dental device. ?  ?RECOMMENDATION: Auto CPAP device ResMed with a setting between 6 and 16 cm water pressure to centimeter EPR, heated humidification and a mask of patient's choice and comfort.  A follow-up will be scheduled after 60 to 90 days on CPAP.  Please advise the patient that a mask refitting at no cost needs to  take place within 30 days of set up. ? ?  ?INTERPRETING PHYSICIAN: ? ? Larey Seat, MD  ? ?Medical Director of [facility].   ? ? ? ? ? ? ? ? ? ? ? ? ? ? ? ? ? ? ?

## 2021-09-11 ENCOUNTER — Encounter: Payer: Self-pay | Admitting: *Deleted

## 2021-09-11 ENCOUNTER — Telehealth: Payer: Self-pay | Admitting: Neurology

## 2021-09-11 DIAGNOSIS — G4733 Obstructive sleep apnea (adult) (pediatric): Secondary | ICD-10-CM | POA: Insufficient documentation

## 2021-09-11 DIAGNOSIS — Z9989 Dependence on other enabling machines and devices: Secondary | ICD-10-CM | POA: Insufficient documentation

## 2021-09-11 NOTE — Telephone Encounter (Signed)
Pt states she has seen the results to her sleep study on mychart, she'd like to know where things stand on her getting a new CPAP ?

## 2021-09-11 NOTE — Procedures (Signed)
?  ?  ?[facility] ?  ?HOME SLEEP TEST REPORT ( by Watch PAT)   ?STUDY DATE:  09-07-2021 ?  ?ORDERING CLINICIAN: Larey Seat, MD  ?REFERRING CLINICIAN: Dr Judye Bos  ?  ?CLINICAL INFORMATION/HISTORY: referral on 07/18/2021 from PCP . ? Pt is referred by Dr. Lavon Paganini for OSA on CPAP. Last sleep study was done 10 years ago in [place]. Angelica Medina current CPAP Hardin Negus) machine has been recalled and is "too old ' was never able to get it replaced". Here today to get a new CPAP. Pt has continued to use CPAP nightly which has helped with Angelica Medina snoring and feeling well rested.  ?She uses Angelica Medina CPAP every night- claims nobody told Angelica Medina to bring it.  ? ? Angelica Medina has a past medical history of Anxiety, Depression, Hypertension (2016), OSA on CPAP and T2DM (type 2 diabetes mellitus) ([place]) (2016). She is status  post tonsillectomy, goiter, hypothyroidism. In 11-2020 she had knee surgery, arthroscopy. Rare GERD.  ?Plan : HST to get baseline , new machine will be autotitration with 2 cm EPR an new nasal mask.  ?No Phillips device requested. ?  ?[name]  ?  ? ? ?  ?Epworth sleepiness score: 4 /24. On CPAP ?  ?BMI: 33.2 kg/m? ?  ?Neck Circumference: 15" ?  ?FINDINGS: ?  ?Sleep Summary: ?  ?Total Recording Time (hours, min): Total recording time for this home sleep test by watch pat was 9 hours and 43 minutes of which 9 hours and 3 minutes was a total calculated sleep time.  REM sleep amounted to 22% of total sleep time.     ?  ?Respiratory Indices: ?  ?Calculated pAHI (per hour):    The overall apnea hypopnea index was 45.4/h with not much differentiation between REM and non-REM sleep.                       ?  ?Positional AHI and snoring data were not obtained.  Apparently the chest wall electrode failed to detect these important apnea modifiers.                                                 ?  ?Oxygen Saturation Statistics: ?  ?Oxygen Saturation Range (%):    Varied  between a nadir of 85 and a maximum of 99% with a mean oxygenation of 94%.                                 ?  ?O2 Saturation (minutes) <89%:    3.2 minutes     ?  ?Pulse Rate Statistics:       ?  ?Pulse Range:   Varied between 50 and 92 bpm with a mean heart rate of 62 bpm.            ?  ?IMPRESSION:  This HST confirms the presence of severe sleep apnea that appears not to be REM sleep dependent, there was also no significant associated hypoxia.  There was no indication of central apnea components.  This apnea form would be best addressed with continuous positive airway pressure or BiPAP.  Since the patient is used to BiPAP-CPAP I would certainly feel that this is the  best treatment rather than an inspire or dental device. ?Should the patient want to further investigate alternatives to CPAP she would need to repeat a home sleep test with valid snoring and positional data.  If Angelica Medina apnea is strictly positional it may be reducible by a significant proportion and may at that time be amendable to a dental device. ?  ?RECOMMENDATION: Auto CPAP device ResMed with a setting between 6 and 16 cm water pressure to centimeter EPR, heated humidification and a mask of patient's choice and comfort.  A follow-up will be scheduled after 60 to 90 days on CPAP.  Please advise the patient that a mask refitting at no cost needs to  take place within 30 days of set up. ? ?  ?INTERPRETING PHYSICIAN: ? ? Larey Seat, MD  ? ?Medical Director of [facility].   ? ? ? ? ? ? ? ? ? ? ? ? ? ? ? ? ? ? ?

## 2021-09-11 NOTE — Telephone Encounter (Signed)
I called pt. I advised pt that Dr. Brett Fairy reviewed their sleep study results and found that pt has severe sleep apnea. Dr. Brett Fairy recommends that pt be placed on new cpap machine (pt current machine 61 yr old). I reviewed PAP compliance expectations with the pt. Pt is agreeable to starting a CPAP. I advised pt that an order will be sent to a DME, [facility], and [facility] will call the pt within about one week after they file with the pt's insurance. [facility] will show the pt how to use the machine, fit for masks, and troubleshoot the CPAP if needed. A follow up appt was made for insurance purposes with Dr. Brett Fairy on 11/14/21 at 10:30am. Pt verbalized understanding to arrive 15 minutes early and bring their CPAP. A letter with all of this information in it will be mailed to the pt as a reminder. I verified with the pt that the address we have on file is correct. Pt verbalized understanding of results. Pt had no questions at this time but was encouraged to call back if questions arise. I have sent the order to [facility] and have received confirmation that they have received the order. ? ?

## 2021-09-11 NOTE — Addendum Note (Signed)
Addended by: Larey Seat on: 09/11/2021 02:18 PM ? ? Modules accepted: Orders ? ?

## 2021-09-18 ENCOUNTER — Ambulatory Visit (HOSPITAL_COMMUNITY): Payer: BLUE CROSS/BLUE SHIELD

## 2021-09-22 ENCOUNTER — Other Ambulatory Visit: Payer: Self-pay | Admitting: Family Medicine

## 2021-09-22 DIAGNOSIS — Z1231 Encounter for screening mammogram for malignant neoplasm of breast: Secondary | ICD-10-CM

## 2021-10-03 ENCOUNTER — Other Ambulatory Visit: Payer: Self-pay | Admitting: Family Medicine

## 2021-10-03 DIAGNOSIS — E7800 Pure hypercholesterolemia, unspecified: Secondary | ICD-10-CM

## 2021-10-05 ENCOUNTER — Other Ambulatory Visit: Payer: Self-pay | Admitting: Family Medicine

## 2021-10-05 DIAGNOSIS — F331 Major depressive disorder, recurrent, moderate: Secondary | ICD-10-CM

## 2021-10-05 NOTE — Telephone Encounter (Signed)
Copied from [place] [id number]. Topic: Quick Communication - Rx Refill/Question ?>> Oct 05, 2021  3:49 PM Tessa Lerner A wrote: ?Medication: traZODone (DESYREL) 50 MG tablet [[id number]]  ? ?Has the patient contacted their pharmacy? Yes.  The patient has been directed to contact their PCP  ?(Agent: If no, request that the patient contact the pharmacy for the refill. If patient does not wish to contact the pharmacy document the reason why and proceed with request.) ?(Agent: If yes, when and what did the pharmacy advise?) ? ?Preferred Pharmacy (with phone number or street name): [id number] - [place], [place] ?[address] [place] [address] ?Phone: [phone] Fax: [phone] ?Hours: Not open 24 hours ? ?Has the patient been seen for an appointment in the last year OR does the patient have an upcoming appointment? Yes.   ? ?Agent: Please be advised that RX refills may take up to 3 business days. We ask that you follow-up with your pharmacy. ?

## 2021-10-06 ENCOUNTER — Ambulatory Visit
Admission: RE | Admit: 2021-10-06 | Discharge: 2021-10-06 | Disposition: A | Discharge status: Home / Self Care | Payer: BLUE CROSS/BLUE SHIELD | Source: Ambulatory Visit | Attending: Family Medicine | Admitting: Family Medicine

## 2021-10-06 DIAGNOSIS — Z1231 Encounter for screening mammogram for malignant neoplasm of breast: Secondary | ICD-10-CM | POA: Insufficient documentation

## 2021-10-06 MED ORDER — TRAZODONE HCL 50 MG PO TABS: 100.0000 mg | ORAL_TABLET | Freq: Every evening | ORAL | 5 refills | Status: DC | PRN

## 2021-10-06 NOTE — Telephone Encounter (Signed)
Requested Prescriptions  ?Pending Prescriptions Disp Refills  ?? traZODone (DESYREL) 50 MG tablet 90 tablet 5  ?  Sig: Take 2-3 tablets (100-150 mg total) by mouth at bedtime as needed for sleep.  ?  ? Psychiatry: Antidepressants - Serotonin Modulator Passed - 10/05/2021  5:16 PM  ?  ?  Passed - Completed PHQ-2 or PHQ-9 in the last 360 days  ?  ?  Passed - Valid encounter within last 6 months  ?  Recent Outpatient Visits   ?      ? 4 months ago Type 2 diabetes mellitus with diabetic polyneuropathy, without long-term current use of insulin ([place])  ? [facility] [place], Dionne Bucy, MD  ? 1 year ago Type 2 diabetes mellitus with diabetic polyneuropathy, without long-term current use of insulin ([place])  ? [facility] [place], Dionne Bucy, MD  ? 1 year ago Acute non-recurrent frontal sinusitis  ? [facility] [place], Dionne Bucy, MD  ? 1 year ago Type 2 diabetes mellitus with diabetic polyneuropathy, without long-term current use of insulin ([place])  ? [place] [place]  ? 1 year ago Generalized abdominal pain  ? [facility] [place], Dionne Bucy, MD  ?  ?  ?Future Appointments   ?        ? In 2 months [place], Dionne Bucy, MD [facility]  ?  ? ?  ?  ?  ? ? ?

## 2021-10-09 ENCOUNTER — Telehealth: Payer: Self-pay

## 2021-10-09 ENCOUNTER — Other Ambulatory Visit: Payer: Self-pay | Admitting: Family Medicine

## 2021-10-09 DIAGNOSIS — N63 Unspecified lump in unspecified breast: Secondary | ICD-10-CM

## 2021-10-09 DIAGNOSIS — R928 Other abnormal and inconclusive findings on diagnostic imaging of breast: Secondary | ICD-10-CM

## 2021-10-09 NOTE — Telephone Encounter (Signed)
Patient advised that [facility] will be in contact with her to schedule further imaging.   Due to scheduling difficulty patient requested number to call them to get appointment arranged before she has to leave town.  Phone number provided to patient ? ? ? ? ?Copied from [place] [phone]. Topic: General - Other ?>> Oct 09, 2021  8:53 AM Rayann Heman wrote: ?Reason for CRM: Pt called and would like a call back from the office as soon as possible. Patient had a Mammo and results released that she may have a possible mass. Patient is Very concerned and would like a call as soon as possible. Please advise. Patient is unsure of next steps. ?

## 2021-10-23 ENCOUNTER — Ambulatory Visit: Payer: Self-pay

## 2021-10-23 NOTE — Telephone Encounter (Signed)
?  Chief Complaint: foot swelling ?Symptoms: R foot swelling, warmth, redness, pain 5/10 ?Frequency: 2 weeks ?Pertinent Negatives: NA ?Disposition: '[]'$ ED /'[]'$ Urgent Care (no appt availability in office) / '[x]'$ Appointment(In office/virtual)/ '[]'$  [facility]/ '[]'$ Home Care/ '[]'$ Refused Recommended Disposition /'[]'$ [facility]/ '[]'$  Follow-up with PCP ?Additional Notes: pt is currently in [place] and will be driving home tomorrow so was unable to take VV for tomorrow but scheduled for 10/25/21 at [facility]. Pt also wanted to have OV scheduled for Dr. Jacinto Reap on 10/30/21.  ? ?Summary: Swollen R Heel Advice  ? Pt is calling to report that she is having neupathy in her L & R foot. Heel on R foot is swollen and warm to the tough. Pt is calling for advice   ?  ? ?Reason for Disposition ? [1] Swollen foot AND [2] no fever  (Exceptions: localized bump from bunions, calluses, insect bite, sting) ? ?Answer Assessment - Initial Assessment Questions ?1. ONSET: "When did the pain start?"  ?    2 weeks  ?2. LOCATION: "Where is the pain located?"  ?    R foot  ?3. PAIN: "How bad is the pain?"    (Scale 1-10; or mild, moderate, severe) ? - MILD (1-3): doesn't interfere with normal activities.  ? - MODERATE (4-7): interferes with normal activities (e.g., work or school) or awakens from sleep, limping.  ? - SEVERE (8-10): excruciating pain, unable to do any normal activities, unable to walk.  ?    5 ?6. OTHER SYMPTOMS: "Do you have any other symptoms?" (e.g., leg pain, rash, fever, numbness) ?    Swelling, redness and warmth ? ?Protocols used: Foot Pain-A-AH ? ?

## 2021-10-25 ENCOUNTER — Telehealth: Payer: BLUE CROSS/BLUE SHIELD | Admitting: Family Medicine

## 2021-10-25 ENCOUNTER — Encounter: Payer: Self-pay | Admitting: Family Medicine

## 2021-10-25 DIAGNOSIS — M7661 Achilles tendinitis, right leg: Secondary | ICD-10-CM

## 2021-10-25 MED ORDER — MELOXICAM 15 MG PO TABS: 15.0000 mg | ORAL_TABLET | Freq: Every day | ORAL | 0 refills | Status: DC

## 2021-10-25 NOTE — Progress Notes (Signed)
? ? ?MyChart Video Visit ? ? ? ?Virtual Visit via Video Note  ? ?This visit type was conducted due to national recommendations for restrictions regarding the COVID-19 Pandemic (e.g. social distancing) in an effort to limit this patient's exposure and mitigate transmission in our community. This patient is at least at moderate risk for complications without adequate follow up. This format is felt to be most appropriate for this patient at this time. Physical exam was limited by quality of the video and audio technology used for the visit.  ? ?Patient location: home ?Provider location: [facility] ? ?I discussed the limitations of evaluation and management by telemedicine and the availability of in person appointments. The patient expressed understanding and agreed to proceed. ? ?Patient: Angelica Medina   DOB: [birth year]   61 y.o. Female  MRN: [id number] ?Visit Date: 10/25/2021 ? ?Today's healthcare provider: Lelon Huh, MD  ? ?Chief Complaint  ?Patient presents with  ? Foot Swelling  ? ?Subjective  ?  ?HPI  ?Foot swelling: ?Patient complains of swelling of the heel on her right foot for the past 2 weeks. Associated symptoms includes warmth and redness. She reports having chronic neuropathy (tingling sensation) of both feet for several years, but states it seems to have worsened recently.  Started after starting Chair Yoga a few weeks ago. Has been limited by plantar fasciitis, but this time pain and redness is going up back of heel achilles tendon. Ibuprofen upsets her stomach. Using ice packs which help briefly.  ? ? ?Medications: ?Outpatient Medications Prior to Visit  ?Medication Sig  ? atorvastatin (LIPITOR) 20 MG tablet TAKE 1 TABLET(20 MG) BY MOUTH DAILY  ? b complex vitamins capsule Take 1 capsule by mouth daily.  ? DULoxetine (CYMBALTA) 60 MG capsule Take 1 capsule (60 mg total) by mouth daily.  ? fluticasone (FLONASE) 50 MCG/ACT nasal spray Place into the nose.  ? metFORMIN (GLUCOPHAGE) 500 MG tablet TAKE ONE  TABLET BY MOUTH TWICE A DAY WITH MEALS  ? Multiple Vitamin (MULTIVITAMIN) tablet Take 1 tablet by mouth daily.  ? Omega-3 1000 MG CAPS Take 1 tablet by mouth daily.  ? traZODone (DESYREL) 50 MG tablet Take 2-3 tablets (100-150 mg total) by mouth at bedtime as needed for sleep.  ? meloxicam (MOBIC) 15 MG tablet Take 1 tablet (15 mg total) by mouth daily. (Patient not taking: Reported on 10/25/2021)  ? ?No facility-administered medications prior to visit.  ? ? ?Review of Systems  ?Constitutional:  Negative for appetite change, chills, fatigue and fever.  ?Respiratory:  Negative for chest tightness and shortness of breath.   ?Cardiovascular:  Negative for chest pain and palpitations.  ?Gastrointestinal:  Negative for abdominal pain, nausea and vomiting.  ?Musculoskeletal:   ?     Foot swelling  ?Skin:  Positive for color change (redness).  ?Neurological:  Negative for dizziness and weakness.  ? ? ? ? Objective  ?  ?There were no vitals taken for this visit. ? ? ? ? ?Physical Exam  ? ?Awake, alert, oriented x 3. In no apparent distress. Patient unable to get video feed to allow view of foot or ankle.  ? Assessment & Plan  ?  ? ?1. Achilles tendinitis of right lower extremity ? ?- Ambulatory referral to Podiatry ? ?- meloxicam (MOBIC) 15 MG tablet; Take 1 tablet (15 mg total) by mouth daily.  Dispense: 30 tablet; Refill: 0  ? ?Relative rest, ice, etx.  ?  ? ?Video connection was lost when more than 50% of  the duration of the visit was complete, at which time the remainder of the visit was completed via audio only. ? ?I discussed the assessment and treatment plan with the patient. The patient was provided an opportunity to ask questions and all were answered. The patient agreed with the plan and demonstrated an understanding of the instructions. ?  ?The patient was advised to call back or seek an in-person evaluation if the symptoms worsen or if the condition fails to improve as anticipated. ? ?I provided 10 minutes of  non-face-to-face time during this encounter. ? ?The entirety of the information documented in the History of Present Illness, Review of Systems and Physical Exam were personally obtained by me. Portions of this information were initially documented by the CMA and reviewed by me for thoroughness and accuracy.   ? ?Lelon Huh, MD ?[facility] ?[phone] (phone) ?[phone] (fax) ? ?[facility]   ?

## 2021-10-27 ENCOUNTER — Ambulatory Visit
Admission: RE | Admit: 2021-10-27 | Discharge: 2021-10-27 | Disposition: A | Discharge status: Home / Self Care | Payer: BLUE CROSS/BLUE SHIELD | Source: Ambulatory Visit | Attending: Family Medicine | Admitting: Family Medicine

## 2021-10-27 DIAGNOSIS — N63 Unspecified lump in unspecified breast: Secondary | ICD-10-CM

## 2021-10-27 DIAGNOSIS — R928 Other abnormal and inconclusive findings on diagnostic imaging of breast: Secondary | ICD-10-CM

## 2021-10-30 ENCOUNTER — Ambulatory Visit: Payer: BLUE CROSS/BLUE SHIELD | Admitting: Family Medicine

## 2021-10-30 ENCOUNTER — Encounter: Payer: Self-pay | Admitting: Family Medicine

## 2021-10-30 VITALS — BP 136/82 | HR 78 | Temp 98.2°F | Resp 16 | Wt 220.0 lb

## 2021-10-30 DIAGNOSIS — F411 Generalized anxiety disorder: Secondary | ICD-10-CM

## 2021-10-30 DIAGNOSIS — F332 Major depressive disorder, recurrent severe without psychotic features: Secondary | ICD-10-CM | POA: Diagnosis not present

## 2021-10-30 DIAGNOSIS — N814 Uterovaginal prolapse, unspecified: Secondary | ICD-10-CM

## 2021-10-30 DIAGNOSIS — H6981 Other specified disorders of Eustachian tube, right ear: Secondary | ICD-10-CM | POA: Diagnosis not present

## 2021-10-30 DIAGNOSIS — E1142 Type 2 diabetes mellitus with diabetic polyneuropathy: Secondary | ICD-10-CM

## 2021-10-30 MED ORDER — DULOXETINE HCL 40 MG PO CPEP: 40.0000 mg | ORAL_CAPSULE | Freq: Every day | ORAL | 1 refills | Status: DC

## 2021-10-30 NOTE — Assessment & Plan Note (Signed)
Patient was previously followed by Dr. Kenton Kingfisher at [facility], who is no longer with their practice ?She had discussed bladder sling and hysterectomy with him previously ?She is looking for a new provider ?Discussed urogyn and patient is amenable-referral placed today ?

## 2021-10-30 NOTE — Progress Notes (Signed)
?  ? ?I,Sulibeya S Dimas,acting as a scribe for Lavon Paganini, MD.,have documented all relevant documentation on the behalf of Lavon Paganini, MD,as directed by  Lavon Paganini, MD while in the presence of Lavon Paganini, MD. ? ? ?Established patient visit ? ? ?Patient: Angelica Medina   DOB: 11-12-1960   61 y.o. Female  MRN: [id number] ?Visit Date: 10/30/2021 ? ?Today's healthcare provider: Lavon Paganini, MD  ? ?Chief Complaint  ?Patient presents with  ? Ear Problem  ? ?Subjective  ?  ?HPI  ?Patient here today C/O right ear "crakaling" x 2 weeks. Patient denies any pain, discharge or hearing loss. ? ?Depression, Follow-up ? ?She  was last seen for this 5 months ago. ?Changes made at last visit include encouraged therapy. ?  ?She reports excellent compliance with treatment. ?She is not having side effects.  ? ?She reports excellent tolerance of treatment. ?Current symptoms include: depressed mood, difficulty concentrating, fatigue, feelings of worthlessness/guilt, and hopelessness ?She feels she is Unchanged since last visit. ? ?Feels like medication is muting her and would like off of it. ? ? ?  10/30/2021  ?  1:27 PM 06/06/2021  ?  8:24 AM 10/04/2020  ?  3:10 PM  ?Depression screen PHQ 2/9  ?Decreased Interest 1 1   ?Down, Depressed, Hopeless 1 0   ?PHQ - 2 Score 2 1   ?Altered sleeping 1 0   ?Tired, decreased energy 2 1   ?Change in appetite 3 0   ?Feeling bad or failure about yourself  0 0   ?Trouble concentrating 1 0   ?Moving slowly or fidgety/restless 0 0   ?Suicidal thoughts 0 0   ?PHQ-9 Score 9 2   ?Difficult doing work/chores Not difficult at all Not difficult at all   ?  ? Information is confidential and restricted. Go to Review Flowsheets to unlock data.  ?  ?----------------------------------------------------------------------------------------- ?  ? ?Medications: ?Outpatient Medications Prior to Visit  ?Medication Sig  ? atorvastatin (LIPITOR) 20 MG tablet TAKE 1 TABLET(20 MG) BY MOUTH DAILY   ? b complex vitamins capsule Take 1 capsule by mouth daily.  ? fluticasone (FLONASE) 50 MCG/ACT nasal spray Place into the nose.  ? meloxicam (MOBIC) 15 MG tablet Take 1 tablet (15 mg total) by mouth daily.  ? metFORMIN (GLUCOPHAGE) 500 MG tablet TAKE ONE TABLET BY MOUTH TWICE A DAY WITH MEALS  ? Multiple Vitamin (MULTIVITAMIN) tablet Take 1 tablet by mouth daily.  ? Omega-3 1000 MG CAPS Take 1 tablet by mouth daily.  ? traZODone (DESYREL) 50 MG tablet Take 2-3 tablets (100-150 mg total) by mouth at bedtime as needed for sleep.  ? [DISCONTINUED] DULoxetine (CYMBALTA) 60 MG capsule Take 1 capsule (60 mg total) by mouth daily.  ? ?No facility-administered medications prior to visit.  ? ? ?Review of Systems  ?Constitutional:  Positive for activity change and fatigue.  ?Respiratory:  Negative for shortness of breath.   ?Cardiovascular:  Negative for chest pain and palpitations.  ?Psychiatric/Behavioral:  Positive for decreased concentration. The patient is nervous/anxious.   ? ? ?  Objective  ?  ?BP 136/82 (BP Location: Left Arm, Patient Position: Sitting, Cuff Size: Large)   Pulse 78   Temp 98.2 ?F (36.8 ?C) (Oral)   Resp 16   Wt 220 lb (99.8 kg)   SpO2 98%   BMI 34.46 kg/m?  ?BP Readings from Last 3 Encounters:  ?10/30/21 136/82  ?07/18/21 140/82  ?06/06/21 116/76  ? ?Wt Readings from Last 3 Encounters:  ?  10/30/21 220 lb (99.8 kg)  ?07/18/21 212 lb 8 oz (96.4 kg)  ?06/06/21 207 lb 6.4 oz (94.1 kg)  ? ?  ? ?Physical Exam ?Vitals reviewed.  ?Constitutional:   ?   General: She is not in acute distress. ?   Appearance: Normal appearance. She is well-developed. She is not diaphoretic.  ?HENT:  ?   Head: Normocephalic and atraumatic.  ?   Right Ear: Tympanic membrane, ear canal and external ear normal.  ?   Left Ear: Tympanic membrane, ear canal and external ear normal.  ?Eyes:  ?   General: No scleral icterus. ?   Conjunctiva/sclera: Conjunctivae normal.  ?Neck:  ?   Thyroid: No thyromegaly.  ?Cardiovascular:  ?    Rate and Rhythm: Normal rate and regular rhythm.  ?   Pulses: Normal pulses.  ?   Heart sounds: Normal heart sounds. No murmur heard. ?Pulmonary:  ?   Effort: Pulmonary effort is normal. No respiratory distress.  ?   Breath sounds: Normal breath sounds. No wheezing, rhonchi or rales.  ?Musculoskeletal:  ?   Cervical back: Neck supple.  ?   Right lower leg: No edema.  ?   Left lower leg: No edema.  ?Lymphadenopathy:  ?   Cervical: No cervical adenopathy.  ?Skin: ?   General: Skin is warm and dry.  ?   Findings: No rash.  ?Neurological:  ?   Mental Status: She is alert and oriented to person, place, and time. Mental status is at baseline.  ?Psychiatric:     ?   Mood and Affect: Mood normal.     ?   Behavior: Behavior normal.  ?  ? ? ?No results found for any visits on 10/30/21. ? Assessment & Plan  ? ?Problem List Items Addressed This Visit   ? ?  ? Endocrine  ? T2DM (type 2 diabetes mellitus) ([place])  ?  Upcoming follow-up appointment ?UACR today ? ?  ?  ? Relevant Orders  ? Urine Microalbumin w/creat. ratio  ?  ? Nervous and Auditory  ? Eustachian tube dysfunction, right  ?  New problem ?No abnormalities on exam ?No hearing loss or tinnitus ?Suspect that the sound she is hearing is related to eustachian tube dysfunction ?Resume Flonase daily ?Return precautions discussed ? ?  ?  ?  ? Genitourinary  ? Cystocele with uterine prolapse  ?  Patient was previously followed by Dr. Kenton Kingfisher at [facility], who is no longer with their practice ?She had discussed bladder sling and hysterectomy with him previously ?She is looking for a new provider ?Discussed urogyn and patient is amenable-referral placed today ? ?  ?  ? Relevant Orders  ? Ambulatory referral to Urogynecology  ?  ? Other  ? MDD (major depressive disorder), recurrent severe, without psychosis ([place]) - Primary  ?  Chronic and well-controlled ?Patient states that she would like to get off of her depression and anxiety medication as she feels muted emotionally on  it ?She has had thoughts about taking CBD oil or St. John's wort instead of her medications ?She knows that she cannot take St. John's wort while taking her SSRI/SNRI ?We will start a taper of her Cymbalta and see how she does ?Decrease Cymbalta from 60 mg to 40 mg daily ? ?  ?  ? Relevant Medications  ? DULoxetine 40 MG CPEP  ? GAD (generalized anxiety disorder)  ?  Chronic and well-controlled ?Patient states that she would like to get off of  her depression and anxiety medication as she feels muted emotionally on it ?She has had thoughts about taking CBD oil or St. John's wort instead of her medications ?She knows that she cannot take St. John's wort while taking her SSRI/SNRI ?We will start a taper of her Cymbalta and see how she does ?Decrease Cymbalta from 60 mg to 40 mg daily ? ?  ?  ? Relevant Medications  ? DULoxetine 40 MG CPEP  ?  ? ?Return in about 6 weeks (around 12/11/2021) for chronic disease f/u, as scheduled.  ?   ? ?I, Lavon Paganini, MD, have reviewed all documentation for this visit. The documentation on 10/30/21 for the exam, diagnosis, procedures, and orders are all accurate and complete. ? ? ?Virginia Crews, MD, MPH ?[place] ?[facility]   ?

## 2021-10-30 NOTE — Assessment & Plan Note (Signed)
Chronic and well-controlled ?Patient states that she would like to get off of her depression and anxiety medication as she feels muted emotionally on it ?She has had thoughts about taking CBD oil or St. John's wort instead of her medications ?She knows that she cannot take St. John's wort while taking her SSRI/SNRI ?We will start a taper of her Cymbalta and see how she does ?Decrease Cymbalta from 60 mg to 40 mg daily ?

## 2021-10-30 NOTE — Assessment & Plan Note (Signed)
New problem ?No abnormalities on exam ?No hearing loss or tinnitus ?Suspect that the sound she is hearing is related to eustachian tube dysfunction ?Resume Flonase daily ?Return precautions discussed ?

## 2021-10-30 NOTE — Assessment & Plan Note (Signed)
Upcoming follow-up appointment ?UACR today ?

## 2021-10-31 ENCOUNTER — Telehealth: Payer: Self-pay | Admitting: Family Medicine

## 2021-10-31 LAB — MICROALBUMIN / CREATININE URINE RATIO
Creatinine, Urine: 21.2 mg/dL
Microalb/Creat Ratio: <14 mg/g creat (ref 0–29)
Microalbumin, Urine: <3 ug/mL

## 2021-10-31 MED ORDER — METFORMIN HCL 500 MG PO TABS: 500.0000 mg | ORAL_TABLET | Freq: Two times a day (BID) | ORAL | 1 refills | Status: DC

## 2021-10-31 NOTE — Addendum Note (Signed)
Addended by: Doristine Devoid on: 10/31/2021 10:25 AM ? ? Modules accepted: Orders ? ?

## 2021-10-31 NOTE — Telephone Encounter (Signed)
Refilled

## 2021-10-31 NOTE — Telephone Encounter (Signed)
[facility] faxed refill request for the following medications:   metFORMIN (GLUCOPHAGE) 500 MG tablet    Please advise.  

## 2021-11-12 ENCOUNTER — Encounter: Payer: Self-pay | Admitting: Neurology

## 2021-11-14 ENCOUNTER — Ambulatory Visit: Payer: BLUE CROSS/BLUE SHIELD | Admitting: Neurology

## 2021-11-14 ENCOUNTER — Encounter: Payer: Self-pay | Admitting: Neurology

## 2021-11-14 VITALS — BP 147/82 | HR 70 | Ht 67.0 in | Wt 220.0 lb

## 2021-11-14 DIAGNOSIS — G4733 Obstructive sleep apnea (adult) (pediatric): Secondary | ICD-10-CM

## 2021-11-14 DIAGNOSIS — F331 Major depressive disorder, recurrent, moderate: Secondary | ICD-10-CM

## 2021-11-14 DIAGNOSIS — Z9989 Dependence on other enabling machines and devices: Secondary | ICD-10-CM

## 2021-11-14 NOTE — Patient Instructions (Signed)

## 2021-11-14 NOTE — Progress Notes (Signed)
? ? ?SLEEP MEDICINE CLINIC ?  ? ?Provider:  Larey Seat, MD  ?Primary Care Physician:  Virginia Crews, MD ?[address] ?[place] [address]  ? ?  ?Referring Provider: Virginia Crews, Md ?[address] ?[address] ?[place],  [place] [address]  ?  ?  ?    ?Chief Complaint according to patient   ?Patient presents with:  ?  ? New Patient (Initial Visit)  ?     ?  ?  ?HISTORY OF PRESENT ILLNESS:  11-14-2021;  ?Angelica Medina is a 61 y.o. Caucasian female patient seen in her first RV on 11/14/2021: ? ?I am happy to state that Mrs. Angelica Medina has liked her new sleep apnea machine but she was prescribed after her home sleep test confirmed the presence of apnea.  She was originally seen on 07/18/2021, and at that time had used an CPAP machine that was probably over a decade old.  She had moved from out of state into [place] and her CPAP machine was a Philips device at all on top of all that was also recalled.  She had felt that CPAP had helped in the past she felt well rested and her snoring was controlled.  The home sleep test was performed on 3-9 2023 and showed an overall AHI the apnea hypopnea index of 45.4/h so this is still severe sleep apnea it was equally severe in rem and non-REM REM sleep.  There were no snoring data obtained a positional data apparently the chest wall electrode failed to detect loss.  There was no significant hypoxia only 3.2 minutes of low oxygen sleep were recorded throughout the night and this patient achieved a total calculated sleep time of 9 hours and 3 minutes. ? ?She has used her AutoSet with a minimum setting of 6 and a maximum pressure of 16 and 2 cm expiratory pressure relief 100% of the last 30 days and 29 of those days over 4 hours.  Average user time is 7 hours 7 minutes.  Her AHI is 3.4 which is which is a good resolution, she does have moderate high air leakage which often is seen with nasal pillows or nasal masks.  The pressure at the 95th percentile  is 11.2 cmH2O and is well covered by the current setting. ? ?She has switched from a medium to small nasal pillow mask.  Her Epworth sleepiness score is 2 out of 24 possible points which is a low score and she is overall feeling the benefits of therapy again. ? ? ? ? ? ?Chief concern according to patient :  ?07-18-2021:  Pt is referred by Dr. Lavon Paganini for OSA on CPAP. Last SS done 10 years ago in [place].  Pt reports her current CPAP Hardin Negus) machine has been recalled and too old ' was never able to get it replaced". Here today to get a new CPAP. Pt has continued to use CPAP nightly. Has helped with her snoring and feeling overall well rested.  ?She uses her CPAP every night- nobody told her to bring it.  ? ?  ?I have the pleasure of seeing Angelica Medina today, a right -handed White or Caucasian female with a possible sleep disorder.  She has a  has a past medical history of Anxiety, Depression, Hypertension (2016), Sleep apnea, and T2DM (type 2 diabetes mellitus) ([place]) (2016). post tonsillectomy, goiter, thyroidism. 11-2020 knee surgery, arthroscopy. Rare GERD.  ?  ?The patient had the first sleep study in the  year 2010 in [place]- and she was told it was severe.     ?Sleep relevant medical history: may snore with CPAP when in supine- no nocturia.   ? Family medical /sleep history: brother on CPAP with OSA, i  ?  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  ? and lives in a household with spouse- one dog. ? Tobacco use: quit 30 years ago. ? ETOH use 1/month or less.  ?Caffeine intake in form of Coffee( /) Soda( /) Tea ( /) or energy drinks. ?Regular exercise in form of walking. Has been less active since knee surgery.    ? ?Sleep habits are as follows: The patient's dinner time is between 5 PM. The patient goes to bed at 9 PM and continues to sleep for 7 hours. ?The preferred sleep position is sideways, with the support of 1 pillow. Dreams are reportedly  frequent. ? ?5.30-6 AM is the usual rise time. The patient wakes up spontaneously. ?She reports not feeling refreshed or restored in AM if she hasn't used CPAP- CPAP dependent-  ?, with symptoms such as dry mouth, and residual fatigue.  ?Naps are not taken. ? ?  ?Review of Systems: ?Out of a complete 14 system review, the patient complains of only the following symptoms, and all other reviewed systems are negative.:  ? ?Insomnia when not taking TRAZODONE - ?Early morning arousals. None on CPAP . ?GERD is rare, and she is sleeping very well, refreshed, restored.    ? ?Numbness in both feet, ankle edema in the left foot  ?How likely are you to doze in the following situations: ?0 = not likely, 1 = slight chance, 2 = moderate chance, 3 = high chance ?  ?Sitting and Reading? ?Watching Television? ?Sitting inactive in a public place (theater or meeting)? ?As a passenger in a car for an hour without a break? ?Lying down in the afternoon when circumstances permit? ?Sitting and talking to someone? ?Sitting quietly after lunch without alcohol? ?In a car, while stopped for a few minutes in traffic? ?  ?Total = 2 / 24 points  ? FSS endorsed at 18/ 63 points.  ? ?Social History  ? ?Socioeconomic History  ? Marital status: Married  ?  Spouse name: [name]  ? Number of children: 3  ? Years of education: 24  ? Highest education level: High school graduate  ?Occupational History  ? 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 Smoking status: Former  ?  Packs/day: 0.50  ?  Years: 20.00  ?  Pack years: 10.00  ?  Types: Cigarettes  ?  Quit date: 07/01/1994  ?  Years since quitting: 27.3  ? Smokeless tobacco: Never  ?Vaping Use  ? Vaping Use: Never used  ?Substance and Sexual Activity  ? Alcohol use: No  ? Drug use: No  ? Sexual activity: Not Currently  ?Other Topics Concern  ? Not on file  ?Social History Narrative  ? Lives at home with husband  ? Right handed  ? Caffeine: 1 iced tea a week  ? ?Social Determinants of Health   ? ?Financial Resource Strain: Not on file  ?Food Insecurity: Not on file  ?Transportation Needs: Not on file  ?Physical Activity: Not on file  ?Stress: Not on file  ?Social Connections: Not on file  ? ? ?Family History  ?Problem Relation Age of Onset  ? Thyroid cancer Mother   ? Anxiety disorder  Mother   ? Depression Mother   ? Depression Father   ? Diabetes Father   ? COPD Father   ? Anxiety disorder Father   ? Hypothyroidism Sister   ? Anxiety disorder Sister   ? Healthy Brother   ? Anxiety disorder Brother   ? Anxiety disorder Brother   ? Anxiety disorder Sister   ? Depression Sister   ? Drug abuse Paternal Aunt   ? Alcohol abuse Maternal Grandfather   ? Alcohol abuse Paternal Grandfather   ? Alcohol abuse Paternal Grandmother   ? Drug abuse Other   ? ? ?Past Medical History:  ?Diagnosis Date  ? Anxiety   ? Depression   ? Hypertension 2016  ? Sleep apnea   ? T2DM (type 2 diabetes mellitus) ([place]) 2016  ? diet controlled  ? ? ?Past Surgical History:  ?Procedure Laterality Date  ? [place]  ? for herniated disc, no hardware  ? BREAST BIOPSY Left 2009  ? benign  ? COLONOSCOPY WITH PROPOFOL N/A 09/26/2020  ? Procedure: COLONOSCOPY WITH PROPOFOL;  Surgeon: Jonathon Bellows, MD;  Location: [facility] ENDOSCOPY;  Service: Gastroenterology;  Laterality: N/A;  ? ECTOPIC PREGNANCY SURGERY    ? ESOPHAGEAL DILATION  2014  ? has had several i nthe past for achalasia  ? KNEE SURGERY    ? OPEN REDUCTION INTERNAL FIXATION (ORIF) DISTAL RADIAL FRACTURE Left 09/17/2017  ? Procedure: OPEN REDUCTION INTERNAL FIXATION (ORIF) DISTAL RADIAL FRACTURE;  Surgeon: Lovell Sheehan, MD;  Location: [facility] ORS;  Service: Orthopedics;  Laterality: Left;  ?  ? ?Current Outpatient Medications on File Prior to Visit  ?Medication Sig Dispense Refill  ? atorvastatin (LIPITOR) 20 MG tablet TAKE 1 TABLET(20 MG) BY MOUTH DAILY 90 tablet 0  ? b complex vitamins capsule Take 1 capsule by mouth daily.    ? DULoxetine 40 MG CPEP Take 40 mg by mouth daily. 90  capsule 1  ? fluticasone (FLONASE) 50 MCG/ACT nasal spray Place into the nose.    ? meloxicam (MOBIC) 15 MG tablet Take 1 tablet (15 mg total) by mouth daily. 30 tablet 0  ? metFORMIN (GLUCOPHAGE) 500 MG

## 2021-12-08 NOTE — Progress Notes (Unsigned)
I,Sulibeya S Dimas,acting as a Education administrator for Lavon Paganini, MD.,have documented all relevant documentation on the behalf of Lavon Paganini, MD,as directed by  Lavon Paganini, MD while in the presence of Lavon Paganini, MD.   Complete physical exam   Patient: Angelica Medina   DOB: 1960/10/12   61 y.o. Female  MRN: [id number] Visit Date: 12/11/2021  Today's healthcare provider: Lavon Paganini, MD   Chief Complaint  Patient presents with   Annual Exam   Subjective    Angelica Medina is a 61 y.o. female who presents today for a complete physical exam.  She reports consuming a general diet.  Rides bike, and walks  She generally feels well. She reports sleeping well. She does not have additional problems to discuss today.   HPI   Pap done at GYN 2021  Past Medical History:  Diagnosis Date   Anxiety    Depression    Hypertension 2016   Sleep apnea    T2DM (type 2 diabetes mellitus) ([place]) 2016   diet controlled   Past Surgical History:  Procedure Laterality Date   [place]   for herniated disc, no hardware   BREAST BIOPSY Left 2009   benign   COLONOSCOPY WITH PROPOFOL N/A 09/26/2020   Procedure: COLONOSCOPY WITH PROPOFOL;  Surgeon: Jonathon Bellows, MD;  Location: [facility] ENDOSCOPY;  Service: Gastroenterology;  Laterality: N/A;   ECTOPIC PREGNANCY SURGERY     ESOPHAGEAL DILATION  2014   has had several i nthe past for achalasia   KNEE SURGERY     OPEN REDUCTION INTERNAL FIXATION (ORIF) DISTAL RADIAL FRACTURE Left 09/17/2017   Procedure: OPEN REDUCTION INTERNAL FIXATION (ORIF) DISTAL RADIAL FRACTURE;  Surgeon: Lovell Sheehan, MD;  Location: [facility] ORS;  Service: Orthopedics;  Laterality: Left;   Social History   Socioeconomic History   Marital status: Married    Spouse name: [name]   Number of children: 3   Years of education: 12   Highest education level: High school graduate  Occupational History   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  Tobacco Use    Smoking status: Former    Packs/day: 0.50    Years: 20.00    Total pack years: 10.00    Types: Cigarettes    Quit date: 07/01/1994    Years since quitting: 27.4   Smokeless tobacco: Never  Vaping Use   Vaping Use: Never used  Substance and Sexual Activity   Alcohol use: No   Drug use: No   Sexual activity: Not Currently  Other Topics Concern   Not on file  Social History Narrative   Lives at home with husband   Right handed   Caffeine: 1 iced tea a week   Social Determinants of Health   Financial Resource Strain: Low Risk  (10/21/2017)   Overall Financial Resource Strain (CARDIA)    Difficulty of Paying Living Expenses: Not hard at all  Food Insecurity: No Food Insecurity (10/21/2017)   Hunger Vital Sign    Worried About Running Out of Food in the Last Year: Never true    [place] in the Last Year: Never true  Transportation Needs: No Transportation Needs (10/21/2017)   PRAPARE - Hydrologist (Medical): No    Lack of Transportation (Non-Medical): No  Physical Activity: Inactive (10/21/2017)   Exercise Vital Sign    Days of Exercise per Week: 0 days    Minutes of Exercise per Session: 0 min  Stress:  Stress Concern Present (10/21/2017)   [place]    Feeling of Stress : Rather much  Social Connections: Moderately Integrated (10/21/2017)   Social Connection and Isolation Panel [NHANES]    Frequency of Communication with Friends and Family: Three times a week    Frequency of Social Gatherings with Friends and Family: More than three times a week    Attends Religious Services: More than 4 times per year    Active Member of [employer] or Organizations: No    Attends Archivist Meetings: Never    Marital Status: Married  Human resources officer Violence: Not At Risk (10/21/2017)   Humiliation, Afraid, Rape, and Kick questionnaire    Fear of Current or Ex-Partner: No    Emotionally  Abused: No    Physically Abused: No    Sexually Abused: No   Family Status  Relation Name Status   Mother  Alive   Father  Deceased   Sister  Alive   Sister  Alive   Brother  Alive   Brother  Deceased       accidental   MGF  (Not Specified)   PGM  (Not Specified)   PGF  (Not Specified)   Field seismologist  (Not Specified)   Other  Deceased   Family History  Problem Relation Age of Onset   Thyroid cancer Mother        was told pre-cancerous cells   Anxiety disorder Mother    Depression Mother    Depression Father    Diabetes Father    COPD Father    Anxiety disorder Father    Hypothyroidism Sister    Anxiety disorder Sister    Anxiety disorder Sister    Depression Sister    Healthy Brother    Anxiety disorder Brother    Anxiety disorder Brother    Alcohol abuse Maternal Grandfather    Alcohol abuse Paternal Grandmother    Alcohol abuse Paternal Grandfather    Drug abuse Paternal Aunt    Drug abuse Other    Allergies  Allergen Reactions   Bupropion Anxiety and Other (See Comments)   Oxycodone Itching    Patient Care Team: Virginia Crews, MD as PCP - General (Family Medicine)   Medications: Outpatient Medications Prior to Visit  Medication Sig   atorvastatin (LIPITOR) 20 MG tablet TAKE 1 TABLET(20 MG) BY MOUTH DAILY   b complex vitamins capsule Take 1 capsule by mouth daily.   DULoxetine 40 MG CPEP Take 40 mg by mouth daily.   fluticasone (FLONASE) 50 MCG/ACT nasal spray Place into the nose.   meloxicam (MOBIC) 15 MG tablet Take 1 tablet (15 mg total) by mouth daily.   metFORMIN (GLUCOPHAGE) 500 MG tablet Take 1 tablet (500 mg total) by mouth 2 (two) times daily with a meal.   Multiple Vitamin (MULTIVITAMIN) tablet Take 1 tablet by mouth daily.   Omega-3 1000 MG CAPS Take 1 tablet by mouth daily.   traZODone (DESYREL) 50 MG tablet Take 2-3 tablets (100-150 mg total) by mouth at bedtime as needed for sleep.   No facility-administered medications prior to visit.     Review of Systems  Constitutional:  Positive for fatigue.  Respiratory:  Positive for apnea.   Musculoskeletal:  Positive for arthralgias.  Allergic/Immunologic: Positive for environmental allergies.  Psychiatric/Behavioral:  Positive for decreased concentration. The patient is nervous/anxious.   All other systems reviewed and are negative.   Last CBC Lab Results  Component Value Date   WBC 8.1 01/23/2019   HGB 12.7 01/23/2019   HCT 40.1 01/23/2019   MCV 88 01/23/2019   MCH 27.9 01/23/2019   RDW 13.2 01/23/2019   PLT 347 81/07/7508   Last metabolic panel Lab Results  Component Value Date   GLUCOSE 106 (H) 09/06/2020   NA 137 01/16/2021   K 4.3 01/16/2021   CL 99 01/16/2021   CO2 26 (A) 01/16/2021   BUN 11 01/16/2021   CREATININE 0.7 01/16/2021   GFRNONAA 99 01/16/2021   CALCIUM 9.6 01/16/2021   PROT 7.3 07/31/2019   ALBUMIN 4.4 01/16/2021   LABGLOB 2.9 07/31/2019   AGRATIO 1.5 07/31/2019   BILITOT 0.4 07/31/2019   ALKPHOS 78 01/16/2021   AST 15 01/16/2021   ALT 14 01/16/2021   Last lipids Lab Results  Component Value Date   CHOL 146 01/16/2021   HDL 60 01/16/2021   LDLCALC 70 01/16/2021   TRIG 87 01/16/2021   CHOLHDL 2.5 07/31/2019   Last hemoglobin A1c Lab Results  Component Value Date   HGBA1C 6.3 (A) 06/06/2021   Last thyroid functions Lab Results  Component Value Date   TSH 1.390 07/31/2019   Last vitamin D Lab Results  Component Value Date   VD25OH 48.6 01/23/2019   Last vitamin B12 and Folate Lab Results  Component Value Date   VITAMINB12 776 01/23/2019      Objective     BP 133/83 (BP Location: Left Arm, Patient Position: Sitting, Cuff Size: Large)   Pulse 67   Temp 98.7 F (37.1 C) (Oral)   Resp 16   Ht 5' 7.5" (1.715 m)   Wt 218 lb (98.9 kg)   BMI 33.64 kg/m  BP Readings from Last 3 Encounters:  12/11/21 133/83  11/14/21 (!) 147/82  10/30/21 136/82   Wt Readings from Last 3 Encounters:  12/11/21 218 lb (98.9 kg)   11/14/21 220 lb (99.8 kg)  10/30/21 220 lb (99.8 kg)      Physical Exam Vitals reviewed.  Constitutional:      General: She is not in acute distress.    Appearance: Normal appearance. She is well-developed. She is not diaphoretic.  HENT:     Head: Normocephalic and atraumatic.     Right Ear: Tympanic membrane, ear canal and external ear normal.     Left Ear: Tympanic membrane, ear canal and external ear normal.     Nose: Nose normal.     Mouth/Throat:     Mouth: Mucous membranes are moist.     Pharynx: Oropharynx is clear. No oropharyngeal exudate.  Eyes:     General: No scleral icterus.    Conjunctiva/sclera: Conjunctivae normal.     Pupils: Pupils are equal, round, and reactive to light.  Neck:     Thyroid: No thyromegaly.  Cardiovascular:     Rate and Rhythm: Normal rate and regular rhythm.     Pulses: Normal pulses.     Heart sounds: Normal heart sounds. No murmur heard. Pulmonary:     Effort: Pulmonary effort is normal. No respiratory distress.     Breath sounds: Normal breath sounds. No wheezing or rales.  Abdominal:     General: There is no distension.     Palpations: Abdomen is soft.     Tenderness: There is no abdominal tenderness.  Musculoskeletal:        General: No deformity.     Cervical back: Neck supple.     Right lower leg: No edema.  Left lower leg: No edema.  Lymphadenopathy:     Cervical: No cervical adenopathy.  Skin:    General: Skin is warm and dry.     Findings: No rash.  Neurological:     Mental Status: She is alert and oriented to person, place, and time. Mental status is at baseline.     Gait: Gait normal.  Psychiatric:        Mood and Affect: Mood normal.        Behavior: Behavior normal.        Thought Content: Thought content normal.       Last depression screening scores    12/11/2021    8:40 AM 10/30/2021    1:27 PM 06/06/2021    8:24 AM  PHQ 2/9 Scores  PHQ - 2 Score '3 2 1  '$ PHQ- 9 Score '12 9 2   '$ Last fall risk  screening    12/11/2021    8:40 AM  [place] in the past year? 0  Number falls in past yr: 0  Injury with Fall? 0  Risk for fall due to : No Fall Risks  Follow up Falls evaluation completed   Last Audit-C alcohol use screening    12/11/2021    8:40 AM  Alcohol Use Disorder Test (AUDIT)  1. How often do you have a drink containing alcohol? 0  2. How many drinks containing alcohol do you have on a typical day when you are drinking? 0  3. How often do you have six or more drinks on one occasion? 0  AUDIT-C Score 0   A score of 3 or more in women, and 4 or more in men indicates increased risk for alcohol abuse, EXCEPT if all of the points are from question 1   No results found for any visits on 12/11/21.  Assessment & Plan    Routine Health Maintenance and Physical Exam  Exercise Activities and Dietary recommendations  Goals   None     Immunization History  Administered Date(s) Administered   Influenza,inj,Quad PF,6+ Mos 02/28/2017, 05/21/2018, 03/10/2019   Pneumococcal Polysaccharide-23 02/28/2017   Tdap 12/22/2010   Zoster Recombinat (Shingrix) 07/31/2019, 01/26/2020    Health Maintenance  Topic Date Due   COVID-19 Vaccine (1) Never done   TETANUS/TDAP  12/21/2020   OPHTHALMOLOGY EXAM  04/15/2021   FOOT EXAM  09/06/2021   HEMOGLOBIN A1C  12/05/2021   INFLUENZA VACCINE  01/30/2022   MAMMOGRAM  10/07/2023   PAP SMEAR-Modifier  04/12/2025   COLONOSCOPY (Pts 45-4yr Insurance coverage will need to be confirmed)  09/27/2030   Hepatitis C Screening  Completed   HIV Screening  Completed   Zoster Vaccines- Shingrix  Completed   HPV VACCINES  Aged Out    Discussed health benefits of physical activity, and encouraged her to engage in regular exercise appropriate for her age and condition.  Problem List Items Addressed This Visit       Cardiovascular and Mediastinum   Hypertension associated with diabetes ([place]    Well controlled with lifestyle  management No medications Recheck metabolic panel F/u in 6 months       Relevant Orders   Comprehensive metabolic panel     Endocrine   T2DM (type 2 diabetes mellitus) ([place]    Previously well controlled Continue metformin at current dose We have hesitated on GLP-1 given her mother's history of thyroid cancer, but she is trying to get some more information about this Up-to-date on screenings  We will send ROI for last eye exam Recheck A1c      Relevant Orders   Hemoglobin A1c   Thyroid nodule    Last thyroid ultrasound was stable or decreasing nodules that did not require dedicated follow-up any further      Hyperlipidemia associated with type 2 diabetes mellitus ([place])    Previously well controlled Continue statin Repeat FLP and CMP Goal LDL <70      Relevant Orders   Lipid panel     Other   Obesity    Discussed importance of healthy weight management Discussed diet and exercise       Other Visit Diagnoses     Encounter for annual health examination    -  Primary   Relevant Orders   Comprehensive metabolic panel   Hemoglobin A1c   Lipid panel        Return in about 6 months (around 06/12/2022) for chronic disease f/u.     I, Lavon Paganini, MD, have reviewed all documentation for this visit. The documentation on 12/11/21 for the exam, diagnosis, procedures, and orders are all accurate and complete.   Bacigalupo, Dionne Bucy, MD, MPH [facility]

## 2021-12-11 ENCOUNTER — Encounter: Payer: Self-pay | Admitting: Family Medicine

## 2021-12-11 ENCOUNTER — Ambulatory Visit (INDEPENDENT_AMBULATORY_CARE_PROVIDER_SITE_OTHER): Payer: BLUE CROSS/BLUE SHIELD | Admitting: Family Medicine

## 2021-12-11 VITALS — BP 133/83 | HR 67 | Temp 98.7°F | Resp 16 | Ht 67.5 in | Wt 218.0 lb

## 2021-12-11 DIAGNOSIS — E1142 Type 2 diabetes mellitus with diabetic polyneuropathy: Secondary | ICD-10-CM

## 2021-12-11 DIAGNOSIS — Z6833 Body mass index (BMI) 33.0-33.9, adult: Secondary | ICD-10-CM

## 2021-12-11 DIAGNOSIS — E785 Hyperlipidemia, unspecified: Secondary | ICD-10-CM

## 2021-12-11 DIAGNOSIS — I152 Hypertension secondary to endocrine disorders: Secondary | ICD-10-CM

## 2021-12-11 DIAGNOSIS — E041 Nontoxic single thyroid nodule: Secondary | ICD-10-CM

## 2021-12-11 DIAGNOSIS — E669 Obesity, unspecified: Secondary | ICD-10-CM

## 2021-12-11 DIAGNOSIS — E1159 Type 2 diabetes mellitus with other circulatory complications: Secondary | ICD-10-CM | POA: Diagnosis not present

## 2021-12-11 DIAGNOSIS — Z0000 Encounter for general adult medical examination without abnormal findings: Secondary | ICD-10-CM | POA: Diagnosis not present

## 2021-12-11 DIAGNOSIS — E1169 Type 2 diabetes mellitus with other specified complication: Secondary | ICD-10-CM

## 2021-12-11 NOTE — Assessment & Plan Note (Signed)
Previously well controlled Continue metformin at current dose We have hesitated on GLP-1 given her mother's history of thyroid cancer, but she is trying to get some more information about this Up-to-date on screenings We will send ROI for last eye exam Recheck A1c

## 2021-12-11 NOTE — Assessment & Plan Note (Signed)
Last thyroid ultrasound was stable or decreasing nodules that did not require dedicated follow-up any further

## 2021-12-11 NOTE — Assessment & Plan Note (Signed)
Well controlled with lifestyle management No medications Recheck metabolic panel F/u in 6 months

## 2021-12-11 NOTE — Assessment & Plan Note (Signed)
Discussed importance of healthy weight management Discussed diet and exercise  

## 2021-12-11 NOTE — Assessment & Plan Note (Signed)
Previously well controlled Continue statin Repeat FLP and CMP Goal LDL < 70 

## 2021-12-12 ENCOUNTER — Telehealth: Payer: Self-pay

## 2021-12-12 LAB — COMPREHENSIVE METABOLIC PANEL
ALT: 18 IU/L (ref 0–32)
AST: 17 IU/L (ref 0–40)
Albumin/Globulin Ratio: 1.5 (ref 1.2–2.2)
Albumin: 4.4 g/dL (ref 3.8–4.8)
Alkaline Phosphatase: 90 IU/L (ref 44–121)
BUN/Creatinine Ratio: 18 (ref 12–28)
BUN: 12 mg/dL (ref 8–27)
Bilirubin Total: 0.3 mg/dL (ref 0.0–1.2)
CO2: 21 mmol/L (ref 20–29)
Calcium: 9.7 mg/dL (ref 8.7–10.3)
Chloride: 99 mmol/L (ref 96–106)
Creatinine, Ser: 0.65 mg/dL (ref 0.57–1.00)
Globulin, Total: 3 g/dL (ref 1.5–4.5)
Glucose: 119 mg/dL — ABNORMAL HIGH (ref 70–99)
Potassium: 4.5 mmol/L (ref 3.5–5.2)
Sodium: 137 mmol/L (ref 134–144)
Total Protein: 7.4 g/dL (ref 6.0–8.5)
eGFR: 100 mL/min/{1.73_m2} (ref >59)

## 2021-12-12 LAB — HEMOGLOBIN A1C
Est. average glucose Bld gHb Est-mCnc: 137 mg/dL
Hgb A1c MFr Bld: 6.4 % — ABNORMAL HIGH (ref 4.8–5.6)

## 2021-12-12 LAB — LIPID PANEL
Chol/HDL Ratio: 3.5 ratio (ref 0.0–4.4)
Cholesterol, Total: 219 mg/dL — ABNORMAL HIGH (ref 100–199)
HDL: 62 mg/dL (ref >39)
LDL Chol Calc (NIH): 139 mg/dL — ABNORMAL HIGH (ref 0–99)
Triglycerides: 101 mg/dL (ref 0–149)
VLDL Cholesterol Cal: 18 mg/dL (ref 5–40)

## 2021-12-12 NOTE — Telephone Encounter (Signed)
Called [facility] in [place] to request result of diabetic eye exam. They report patient was seen in 06/2021 however a diabetic eye exam was not done.   Patient advised.

## 2021-12-24 ENCOUNTER — Other Ambulatory Visit: Payer: Self-pay | Admitting: Family Medicine

## 2021-12-24 DIAGNOSIS — M7661 Achilles tendinitis, right leg: Secondary | ICD-10-CM

## 2021-12-29 ENCOUNTER — Other Ambulatory Visit: Payer: Self-pay | Admitting: Family Medicine

## 2021-12-29 DIAGNOSIS — E7800 Pure hypercholesterolemia, unspecified: Secondary | ICD-10-CM

## 2022-01-25 ENCOUNTER — Telehealth: Payer: Self-pay | Admitting: Family Medicine

## 2022-01-25 NOTE — Telephone Encounter (Signed)
Angelica Medina called and spoke to [name], Merchant navy officer about the refill(s) Duloxetine requested. Advised it was sent on 10/30/21 #90/1 refill(s). She says yes and it will be refilled and ready for the patient to pick up later today, she will receive notification.

## 2022-01-25 NOTE — Telephone Encounter (Signed)
Medication Refill - Medication: DULoxetine 60 MG CPEP  Pt is completely out of her current supply, requesting 90 days  Has the patient contacted their pharmacy? Yes.   (Agent: If no, request that the patient contact the pharmacy for the refill. If patient does not wish to contact the pharmacy document the reason why and proceed with request.) (Agent: If yes, when and what did the pharmacy advise?)  Preferred Pharmacy (with phone number or street name):  [id number] - [place], [place] - [address]  [address] [place] [address]  Phone: [phone] Fax: [phone]   Has the patient been seen for an appointment in the last year OR does the patient have an upcoming appointment? Yes.    Agent: Please be advised that RX refills may take up to 3 business days. We ask that you follow-up with your pharmacy.

## 2022-03-16 ENCOUNTER — Ambulatory Visit: Payer: BLUE CROSS/BLUE SHIELD | Admitting: Obstetrics and Gynecology

## 2022-04-23 ENCOUNTER — Encounter: Payer: Self-pay | Admitting: *Deleted

## 2022-04-28 ENCOUNTER — Other Ambulatory Visit: Payer: Self-pay | Admitting: Family Medicine

## 2022-04-30 NOTE — Telephone Encounter (Signed)
Requested medications are due for refill today.  yes  Requested medications are on the active medications list.  yes  Last refill. 10/31/2021 #180 1 rf  Future visit scheduled.   yes  Notes to clinic.  Labs are expired.    Requested Prescriptions  Pending Prescriptions Disp Refills   metFORMIN (GLUCOPHAGE) 500 MG tablet [Pharmacy Med Name: METFORMIN 500MG TABLETS] 180 tablet 1    Sig: TAKE 1 TABLET(500 MG) BY MOUTH TWICE DAILY WITH A MEAL     Endocrinology:  Diabetes - Biguanides Failed - 04/28/2022  6:34 AM      Failed - B12 Level in normal range and within 720 days    Vitamin B-12  Date Value Ref Range Status  01/23/2019 776 232 - 1,245 pg/mL Final         Failed - CBC within normal limits and completed in the last 12 months    WBC  Date Value Ref Range Status  01/23/2019 8.1 3.4 - 10.8 x10E3/uL Final   RBC  Date Value Ref Range Status  01/23/2019 4.55 3.77 - 5.28 x10E6/uL Final   Hemoglobin  Date Value Ref Range Status  01/23/2019 12.7 11.1 - 15.9 g/dL Final   Hematocrit  Date Value Ref Range Status  01/23/2019 40.1 34.0 - 46.6 % Final   MCHC  Date Value Ref Range Status  01/23/2019 31.7 31.5 - 35.7 g/dL Final   [facility]  Date Value Ref Range Status  01/23/2019 27.9 26.6 - 33.0 pg Final   MCV  Date Value Ref Range Status  01/23/2019 88 79 - 97 fL Final   No results found for: "PLTCOUNTKUC", "LABPLAT", "POCPLA" RDW  Date Value Ref Range Status  01/23/2019 13.2 11.7 - 15.4 % Final         Passed - Cr in normal range and within 360 days    Creatinine, Ser  Date Value Ref Range Status  12/11/2021 0.65 0.57 - 1.00 mg/dL Final         Passed - HBA1C is between 0 and 7.9 and within 180 days    Hemoglobin A1C  Date Value Ref Range Status  01/16/2021 6.3  Final   Hgb A1c MFr Bld  Date Value Ref Range Status  12/11/2021 6.4 (H) 4.8 - 5.6 % Final    Comment:             Prediabetes: 5.7 - 6.4          Diabetes: >6.4          Glycemic control for adults  with diabetes: <7.0          Passed - eGFR in normal range and within 360 days    GFR calc Af Amer  Date Value Ref Range Status  01/06/2020 103  Final   GFR calc non Af Amer  Date Value Ref Range Status  01/16/2021 99  Final   eGFR  Date Value Ref Range Status  12/11/2021 100 >59 mL/min/1.73 Final         Passed - Valid encounter within last 6 months    Recent Outpatient Visits           4 months ago Encounter for annual health examination   [facility] [place], Dionne Bucy, MD   6 months ago MDD (major depressive disorder), recurrent severe, without psychosis [facility])   [facility], Dionne Bucy, MD   6 months ago Achilles tendinitis of right lower extremity   [facility] Lelon Huh  E, MD   10 months ago Type 2 diabetes mellitus with diabetic polyneuropathy, without long-term current use of insulin [facility])   [facility] [place], Dionne Bucy, MD   1 year ago Type 2 diabetes mellitus with diabetic polyneuropathy, without long-term current use of insulin [facility])   [facility] Bacigalupo, Dionne Bucy, MD       Future Appointments             In 1 week Wannetta Sender, Governor Rooks, MD Urogynecology at [facility], [facility]   In 1 month Bacigalupo, Dionne Bucy, MD [facility]   In 6 months Debbora Presto, NP [facility]

## 2022-05-08 ENCOUNTER — Ambulatory Visit: Payer: BLUE CROSS/BLUE SHIELD | Admitting: Obstetrics and Gynecology

## 2022-06-12 NOTE — Progress Notes (Unsigned)
I,Carlson Belland S Nataly Pacifico,acting as a Education administrator for Lavon Paganini, MD.,have documented all relevant documentation on the behalf of Lavon Paganini, MD,as directed by  Lavon Paganini, MD while in the presence of Lavon Paganini, MD.    Established patient visit   Patient: Angelica Medina   DOB: [birth year]   61 y.o. Female  MRN: [id number] Visit Date: 06/14/2022  Today's healthcare provider: Lavon Paganini, MD   No chief complaint on file.  Subjective    HPI  Diabetes Mellitus Type II, follow-up  Lab Results  Component Value Date   HGBA1C 6.4 (H) 12/11/2021   HGBA1C 6.3 (A) 06/06/2021   HGBA1C 6.3 01/16/2021   Last seen for diabetes 6 months ago.  Management since then includes continuing the same treatment. She reports {excellent/good/fair/poor:19665} compliance with treatment. She {is/is not:21021397} having side effects. {document side effects if present:1}  Home blood sugar records: {diabetes glucometry results:16657}  Episodes of hypoglycemia? {Yes/No:20286} {enter details if yes:1}   Current insulin regiment: none Most Recent Eye Exam: ***  --------------------------------------------------------------------------------------------------- Hypertension, follow-up  BP Readings from Last 3 Encounters:  12/11/21 133/83  11/14/21 (!) 147/82  10/30/21 136/82   Wt Readings from Last 3 Encounters:  12/11/21 218 lb (98.9 kg)  11/14/21 220 lb (99.8 kg)  10/30/21 220 lb (99.8 kg)     She was last seen for hypertension 6 months ago.  BP at that visit was 147/82. Management since that visit includes no changes. She reports {excellent/good/fair/poor:19665} compliance with treatment. She {is/is not:9024} having side effects. {document side effects if present:1}   Outside blood pressures are {enter patient reported home BP, or 'not being checked':1}.    --------------------------------------------------------------------------------------------------- Lipid/Cholesterol, follow-up  Last Lipid Panel: Lab Results  Component Value Date   CHOL 219 (H) 12/11/2021   LDLCALC 139 (H) 12/11/2021   HDL 62 12/11/2021   TRIG 101 12/11/2021    She was last seen for this 6 months ago.  Management since that visit includes no changes.  She reports {excellent/good/fair/poor:19665} compliance with treatment. She {is/is not:9024} having side effects. {document side effects if XTGGYIR:4}  Last metabolic panel Lab Results  Component Value Date   GLUCOSE 119 (H) 12/11/2021   NA 137 12/11/2021   K 4.5 12/11/2021   BUN 12 12/11/2021   CREATININE 0.65 12/11/2021   EGFR 100 12/11/2021   GFRNONAA 99 01/16/2021   CALCIUM 9.7 12/11/2021   AST 17 12/11/2021   ALT 18 12/11/2021   The 10-year ASCVD risk score (Arnett DK, et al., 2019) is: 7.4%  ---------------------------------------------------------------------------------------------------   Medications: Outpatient Medications Prior to Visit  Medication Sig   atorvastatin (LIPITOR) 20 MG tablet TAKE 1 TABLET(20 MG) BY MOUTH DAILY   b complex vitamins capsule Take 1 capsule by mouth daily.   DULoxetine 40 MG CPEP Take 40 mg by mouth daily.   fluticasone (FLONASE) 50 MCG/ACT nasal spray Place into the nose.   meloxicam (MOBIC) 15 MG tablet TAKE 1 TABLET(15 MG) BY MOUTH DAILY   metFORMIN (GLUCOPHAGE) 500 MG tablet TAKE 1 TABLET(500 MG) BY MOUTH TWICE DAILY WITH A MEAL   Multiple Vitamin (MULTIVITAMIN) tablet Take 1 tablet by mouth daily.   Omega-3 1000 MG CAPS Take 1 tablet by mouth daily.   traZODone (DESYREL) 50 MG tablet Take 2-3 tablets (100-150 mg total) by mouth at bedtime as needed for sleep.   No facility-administered medications prior to visit.    Review of Systems  Constitutional:  Negative for appetite change and fatigue.  Eyes:  Negative for visual disturbance.  Respiratory:   Negative for chest tightness and shortness of breath.   Cardiovascular:  Negative for chest pain and leg swelling.  Gastrointestinal:  Negative for abdominal pain, nausea and vomiting.  Neurological:  Negative for dizziness, light-headedness and headaches.    {Labs  Heme  Chem  Endocrine  Serology  Results Review (optional):23779}   Objective    There were no vitals taken for this visit. BP Readings from Last 3 Encounters:  12/11/21 133/83  11/14/21 (!) 147/82  10/30/21 136/82   Wt Readings from Last 3 Encounters:  12/11/21 218 lb (98.9 kg)  11/14/21 220 lb (99.8 kg)  10/30/21 220 lb (99.8 kg)      Physical Exam  ***  No results found for any visits on 06/14/22.  Assessment & Plan     ***  No follow-ups on file.      {provider attestation***:1}   Lavon Paganini, MD  [facility] [phone] (phone) [phone] (fax)  [place]

## 2022-06-14 ENCOUNTER — Ambulatory Visit: Payer: BLUE CROSS/BLUE SHIELD | Admitting: Family Medicine

## 2022-06-14 ENCOUNTER — Encounter: Payer: Self-pay | Admitting: Family Medicine

## 2022-06-14 VITALS — BP 126/81 | HR 77 | Temp 98.8°F | Resp 16 | Wt 222.2 lb

## 2022-06-14 DIAGNOSIS — I152 Hypertension secondary to endocrine disorders: Secondary | ICD-10-CM

## 2022-06-14 DIAGNOSIS — F411 Generalized anxiety disorder: Secondary | ICD-10-CM

## 2022-06-14 DIAGNOSIS — F3341 Major depressive disorder, recurrent, in partial remission: Secondary | ICD-10-CM

## 2022-06-14 DIAGNOSIS — E669 Obesity, unspecified: Secondary | ICD-10-CM

## 2022-06-14 DIAGNOSIS — E785 Hyperlipidemia, unspecified: Secondary | ICD-10-CM

## 2022-06-14 DIAGNOSIS — E1142 Type 2 diabetes mellitus with diabetic polyneuropathy: Secondary | ICD-10-CM

## 2022-06-14 DIAGNOSIS — Z6834 Body mass index (BMI) 34.0-34.9, adult: Secondary | ICD-10-CM

## 2022-06-14 DIAGNOSIS — F331 Major depressive disorder, recurrent, moderate: Secondary | ICD-10-CM

## 2022-06-14 DIAGNOSIS — E1169 Type 2 diabetes mellitus with other specified complication: Secondary | ICD-10-CM

## 2022-06-14 DIAGNOSIS — Z23 Encounter for immunization: Secondary | ICD-10-CM

## 2022-06-14 DIAGNOSIS — E1159 Type 2 diabetes mellitus with other circulatory complications: Secondary | ICD-10-CM

## 2022-06-14 LAB — POCT GLYCOSYLATED HEMOGLOBIN (HGB A1C)
Est. average glucose Bld gHb Est-mCnc: 146
Hemoglobin A1C: 6.7 % — AB (ref 4.0–5.6)

## 2022-06-14 MED ORDER — DULOXETINE HCL 60 MG PO CPEP: 60.0000 mg | ORAL_CAPSULE | Freq: Every day | ORAL | 3 refills | Status: AC

## 2022-06-14 MED ORDER — FLUTICASONE PROPIONATE 50 MCG/ACT NA SUSP: 2.0000 | Freq: Every day | NASAL | 3 refills | Status: AC

## 2022-06-14 MED ORDER — TRAZODONE HCL 50 MG PO TABS: 100.0000 mg | ORAL_TABLET | Freq: Every evening | ORAL | 3 refills | Status: DC | PRN

## 2022-06-14 NOTE — Assessment & Plan Note (Signed)
Chronic and well controlled Continue cymbalta 60 mg daily

## 2022-06-14 NOTE — Assessment & Plan Note (Signed)
Well controlled Continue lifestyle management Recheck metabolic panel F/u in 6 months  

## 2022-06-14 NOTE — Assessment & Plan Note (Signed)
Previously well controlled Continue statin Repeat FLP and CMP Goal LDL < 70 

## 2022-06-14 NOTE — Assessment & Plan Note (Signed)
Discussed importance of healthy weight management Discussed diet and exercise  

## 2022-06-14 NOTE — Assessment & Plan Note (Signed)
Well controlled With diabetic neuropathy HTN HLD Continue current medications UTD on vaccines, eye exam (ROI Sent), foot exam On Statin Discussed diet and exercise F/u in 6 months

## 2022-06-15 LAB — COMPREHENSIVE METABOLIC PANEL
ALT: 26 IU/L (ref 0–32)
AST: 23 IU/L (ref 0–40)
Albumin/Globulin Ratio: 1.5 (ref 1.2–2.2)
Albumin: 4.4 g/dL (ref 3.9–4.9)
Alkaline Phosphatase: 87 IU/L (ref 44–121)
BUN/Creatinine Ratio: 15 (ref 12–28)
BUN: 10 mg/dL (ref 8–27)
Bilirubin Total: 0.3 mg/dL (ref 0.0–1.2)
CO2: 23 mmol/L (ref 20–29)
Calcium: 9.7 mg/dL (ref 8.7–10.3)
Chloride: 98 mmol/L (ref 96–106)
Creatinine, Ser: 0.67 mg/dL (ref 0.57–1.00)
Globulin, Total: 3 g/dL (ref 1.5–4.5)
Glucose: 124 mg/dL — ABNORMAL HIGH (ref 70–99)
Potassium: 4.6 mmol/L (ref 3.5–5.2)
Sodium: 137 mmol/L (ref 134–144)
Total Protein: 7.4 g/dL (ref 6.0–8.5)
eGFR: 99 mL/min/{1.73_m2} (ref >59)

## 2022-06-15 LAB — LIPID PANEL
Chol/HDL Ratio: 3.8 ratio (ref 0.0–4.4)
Cholesterol, Total: 167 mg/dL (ref 100–199)
HDL: 44 mg/dL (ref >39)
LDL Chol Calc (NIH): 106 mg/dL — ABNORMAL HIGH (ref 0–99)
Triglycerides: 92 mg/dL (ref 0–149)
VLDL Cholesterol Cal: 17 mg/dL (ref 5–40)

## 2022-07-05 ENCOUNTER — Other Ambulatory Visit: Payer: Self-pay

## 2022-07-05 DIAGNOSIS — E7800 Pure hypercholesterolemia, unspecified: Secondary | ICD-10-CM

## 2022-07-05 MED ORDER — ATORVASTATIN CALCIUM 20 MG PO TABS: ORAL_TABLET | ORAL | 2 refills | Status: DC

## 2022-08-02 ENCOUNTER — Other Ambulatory Visit: Payer: Self-pay | Admitting: Family Medicine

## 2022-10-22 ENCOUNTER — Other Ambulatory Visit: Payer: Self-pay

## 2022-10-22 ENCOUNTER — Telehealth: Payer: Self-pay | Admitting: Family Medicine

## 2022-10-22 DIAGNOSIS — F331 Major depressive disorder, recurrent, moderate: Secondary | ICD-10-CM

## 2022-10-22 MED ORDER — TRAZODONE HCL 50 MG PO TABS: 100.0000 mg | ORAL_TABLET | Freq: Every evening | ORAL | 3 refills | Status: AC | PRN

## 2022-10-22 NOTE — Telephone Encounter (Signed)
[facility] faxed refill request for the following medications:   traZODone (DESYREL) 50 MG tablet    Please advise  

## 2022-11-05 ENCOUNTER — Encounter (HOSPITAL_COMMUNITY): Payer: Self-pay

## 2022-11-20 ENCOUNTER — Telehealth: Payer: Self-pay | Admitting: Family Medicine

## 2022-11-29 DIAGNOSIS — M7661 Achilles tendinitis, right leg: Secondary | ICD-10-CM | POA: Insufficient documentation

## 2022-11-29 DIAGNOSIS — M7731 Calcaneal spur, right foot: Secondary | ICD-10-CM | POA: Insufficient documentation

## 2022-12-13 ENCOUNTER — Other Ambulatory Visit: Payer: Self-pay | Admitting: Family Medicine

## 2022-12-13 DIAGNOSIS — N6321 Unspecified lump in the left breast, upper outer quadrant: Secondary | ICD-10-CM

## 2022-12-25 ENCOUNTER — Encounter: Payer: Self-pay | Admitting: Family Medicine

## 2022-12-31 DIAGNOSIS — I10 Essential (primary) hypertension: Secondary | ICD-10-CM | POA: Insufficient documentation

## 2022-12-31 DIAGNOSIS — F330 Major depressive disorder, recurrent, mild: Secondary | ICD-10-CM | POA: Insufficient documentation

## 2022-12-31 DIAGNOSIS — E1169 Type 2 diabetes mellitus with other specified complication: Secondary | ICD-10-CM | POA: Insufficient documentation

## 2023-02-06 ENCOUNTER — Ambulatory Visit: Payer: PRIVATE HEALTH INSURANCE | Admitting: Obstetrics and Gynecology

## 2023-02-06 ENCOUNTER — Other Ambulatory Visit: Payer: Self-pay

## 2023-02-06 ENCOUNTER — Encounter: Payer: Self-pay | Admitting: Obstetrics and Gynecology

## 2023-02-06 VITALS — BP 154/82 | HR 73 | Ht 66.75 in | Wt 226.0 lb

## 2023-02-06 DIAGNOSIS — N393 Stress incontinence (female) (male): Secondary | ICD-10-CM | POA: Diagnosis not present

## 2023-02-06 DIAGNOSIS — N3281 Overactive bladder: Secondary | ICD-10-CM | POA: Diagnosis not present

## 2023-02-06 DIAGNOSIS — N8110 Cystocele, unspecified: Secondary | ICD-10-CM

## 2023-02-06 DIAGNOSIS — R350 Frequency of micturition: Secondary | ICD-10-CM

## 2023-02-06 LAB — POCT URINALYSIS DIPSTICK
Blood, UA: NEGATIVE
Glucose, UA: NEGATIVE
Ketones, UA: NEGATIVE
Leukocytes, UA: NEGATIVE
Nitrite, UA: NEGATIVE
Protein, UA: POSITIVE — AB
Spec Grav, UA: >=1.03 — AB (ref 1.010–1.025)
Urobilinogen, UA: 0.2 E.U./dL
pH, UA: 5.5 (ref 5.0–8.0)

## 2023-02-06 MED ORDER — VIBEGRON 75 MG PO TABS: 75.0000 mg | ORAL_TABLET | Freq: Every day | ORAL | 5 refills | Status: DC

## 2023-02-06 NOTE — Patient Instructions (Addendum)
Plan for surgery: anterior repair and sling  For treatment of stress urinary incontinence, which is leakage with physical activity/movement/strainging/coughing, we discussed expectant management versus nonsurgical options versus surgery. Nonsurgical options include weight loss, physical therapy, as well as a pessary.  Surgical options include a midurethral sling, which is a synthetic mesh sling that acts like a hammock under the urethra to prevent leakage of urine, and transurethral injection of a bulking agent.  We discussed the symptoms of overactive bladder (OAB), which include urinary urgency, urinary frequency, night-time urination, with or without urge incontinence.  We discussed management including behavioral therapy (decreasing bladder irritants by following a bladder diet, urge suppression strategies, timed voids, bladder retraining), physical therapy, medication; and for refractory cases posterior tibial nerve stimulation, sacral neuromodulation, and intravesical botulinum toxin injection.

## 2023-02-06 NOTE — Progress Notes (Signed)
[facility] New Patient Evaluation and Consultation  Referring Provider: Erasmo Downer, MD PCP: Danella Penton, MD Date of Service: 02/06/2023  SUBJECTIVE Chief Complaint: New Patient (Initial Visit) Angelica Medina is a 62 y.o. female here for urinary frequency and SUI.)  History of Present Illness: Angelica Medina is a 62 y.o. White or Caucasian female seen in consultation at the request of Dr. Beryle Flock for evaluation of prolapse and incontinence.    Review of records significant for: Has a cystocele and she is interested in treatment.   Urinary Symptoms: Leaks urine with cough/ sneeze, laughing, exercise, lifting, going from sitting to standing, during sex, with a full bladder, with movement to the bathroom, with urgency, while asleep, and continuously Feels like she does not have any control when she gets an urge.  Leaks "all day" Pad use:  several  pads per day.   She is bothered by her UI symptoms. Has done pelvic PT about 7 years ago but did not see improvement.  Has not had any prior treatment for the leakage.   Day time voids 8.  Nocturia: 1 times per night to void. Voiding dysfunction: she empties her bladder well.  does not use a catheter to empty bladder.  When urinating, she feels dribbling after finishing and the need to urinate multiple times in a row Drinks: 64oz water per day, iced tea occasionally  UTIs:  0  UTI's in the last year.   Denies history of blood in urine and kidney or bladder stones  Pelvic Organ Prolapse Symptoms:                  Denies a feeling of a bulge the vaginal area.   Bowel Symptom: Bowel movements: 1 time(s) per day Stool consistency: hard Straining: no.  Splinting: no.  Incomplete evacuation: no.  She Denies accidental bowel leakage / fecal incontinence Bowel regimen: none   Sexual Function Sexually active: no.  Does not have sex due to leakage.   Pelvic Pain Denies pelvic pain  Past Medical History:   Past Medical History:  Diagnosis Date   Anxiety    Depression    Hypertension 2016   Sleep apnea    T2DM (type 2 diabetes mellitus) (HCC) 2016   diet controlled  Last Hgb A1c on 12/31/22 was 7.0%   Past Surgical History:   Past Surgical History:  Procedure Laterality Date   BACK SURGERY  1996   for herniated disc, no hardware   BREAST BIOPSY Left 2009   benign   COLONOSCOPY WITH PROPOFOL N/A 09/26/2020   Procedure: COLONOSCOPY WITH PROPOFOL;  Surgeon: Wyline Mood, MD;  Location: [facility] END[facility];  Service: Gastroenterology;  Laterality: N/A;   ECTOPIC PREGNANCY SURGERY     Had unilateral salpingectomy with pfannenstiel incision   ESOPHAGEAL DILATION  2014   has had several i nthe past for achalasia   KNEE SURGERY     OPEN REDUCTION INTERNAL FIXATION (ORIF) DISTAL RADIAL FRACTURE Left 09/17/2017   Procedure: OPEN REDUCTION INTERNAL FIXATION (ORIF) DISTAL RADIAL FRACTURE;  Surgeon: Lyndle Herrlich, MD;  Location: [facility] ORS;  Service: Orthopedics;  Laterality: Left;     Past OB/GYN History: OB History  Gravida Para Term Preterm AB Living  4 3 3   1 3   SAB IAB Ectopic Multiple Live Births      1   3    # Outcome Date GA Lbr Len/2nd Weight Sex Type Anes PTL Lv  4 Ectopic  3 Term      Vag-Spont   LIV  2 Term      Vag-Spont   LIV  1 Term      Vag-Spont   LIV    Menopausal: Yes, Denies vaginal bleeding since menopause Last pap smear was 04/2020- negative.  Any history of abnormal pap smears: no.   Medications: She has a current medication list which includes the following prescription(s): b complex vitamins, duloxetine, fluticasone, metformin, multivitamin, omega-3, trazodone, and vibegron.   Allergies: Patient is allergic to bupropion and oxycodone.   Social History:  Social History   Tobacco Use   Smoking status: Former    Current packs/day: 0.00    Average packs/day: 0.5 packs/day for 20.0 years (10.0 ttl pk-yrs)    Types: Cigarettes    Start date:  07/01/1974    Quit date: 07/01/1994    Years since quitting: 28.6   Smokeless tobacco: Never  Vaping Use   Vaping status: Never Used  Substance Use Topics   Alcohol use: No   Drug use: No    Relationship status: married She lives with husband.   She is not employed. Regular exercise: Yes: water aerobics History of abuse: No  Family History:   Family History  Problem Relation Age of Onset   Thyroid cancer Mother        was told pre-cancerous cells   Anxiety disorder Mother    Depression Mother    Depression Father    Diabetes Father    COPD Father    Anxiety disorder Father    Hypothyroidism Sister    Anxiety disorder Sister    Anxiety disorder Sister    Depression Sister    Healthy Brother    Anxiety disorder Brother    Anxiety disorder Brother    Alcohol abuse Maternal Grandfather    Alcohol abuse Paternal Grandmother    Alcohol abuse Paternal Grandfather    Drug abuse Paternal Aunt    Drug abuse Other      Review of Systems: Review of Systems  Constitutional:  Positive for malaise/fatigue. Negative for fever and weight loss.  Respiratory:  Negative for cough, shortness of breath and wheezing.   Cardiovascular:  Positive for palpitations and leg swelling. Negative for chest pain.  Gastrointestinal:  Negative for abdominal pain and blood in stool.  Genitourinary:  Negative for dysuria.  Musculoskeletal:  Negative for myalgias.  Skin:  Negative for rash.  Neurological:  Positive for dizziness and headaches.  Endo/Heme/Allergies:  Does not bruise/bleed easily.  Psychiatric/Behavioral:  Positive for depression. The patient is nervous/anxious.      OBJECTIVE Physical Exam: Vitals:   02/06/23 0842  BP: (!) 154/82  Pulse: 73  Weight: 226 lb (102.5 kg)  Height: 5' 6.75" (1.695 m)    Physical Exam Constitutional:      General: She is not in acute distress. Pulmonary:     Effort: Pulmonary effort is normal.  Abdominal:     General: There is no  distension.     Palpations: Abdomen is soft.     Tenderness: There is no abdominal tenderness. There is no rebound.  Musculoskeletal:        General: No swelling. Normal range of motion.  Skin:    General: Skin is warm and dry.     Findings: No rash.  Neurological:     Mental Status: She is alert and oriented to person, place, and time.  Psychiatric:        Mood and Affect:  Mood normal.        Behavior: Behavior normal.      GU / Detailed Urogynecologic Evaluation:  Pelvic Exam: Normal external female genitalia; Bartholin's and Skene's glands normal in appearance; urethral meatus normal in appearance, no urethral masses or discharge.   CST: positive  Speculum exam reveals normal vaginal mucosa with atrophy. Cervix normal appearance. Uterus normal single, nontender. Adnexa no mass, fullness, tenderness.    Pelvic floor strength 0/V- unable to contract pelvic floor  Pelvic floor musculature: Right levator non-tender, Right obturator non-tender, Left levator non-tender, Left obturator non-tender  POP-Q:   POP-Q  -1                                            Aa   -1                                           Ba  -8                                              C   4                                            Gh  5                                            Pb  9                                            tvl   -3                                            Ap  -3                                            Bp  -8.5                                              D      Rectal Exam:  Normal external rectum  Post-Void Residual (PVR) by Bladder Scan: In order to evaluate bladder emptying, we discussed obtaining a postvoid residual and she agreed to this procedure.  Procedure: The ultrasound unit was placed on the patient's abdomen in the suprapubic region after the patient had voided. A PVR of 27 ml was obtained by bladder scan.  Laboratory Results: POC urine:  negative leukocytes and nitrites  ASSESSMENT AND PLAN Ms. Eitzen is a 62 y.o. with:  1. SUI (stress urinary incontinence, female)   2. Urinary frequency   3. Overactive bladder   4. Prolapse of anterior vaginal wall    SUI - For treatment of stress urinary incontinence,  non-surgical options include expectant management, weight loss, physical therapy, as well as a pessary.  Surgical options include a midurethral sling, Burch urethropexy, and transurethral injection of a bulking agent. - She is interested in a sling. Demonstrated leakage today. Will schedule procedure. Handout provided.  - We discussed risks of bleeding, infection, damage to surrounding organs including bowel, bladder, blood vessels, ureters and nerves, need for further surgery, numbness and weakness at any body site, buttock pain, postoperative cognitive dysfunction, and the rarer risks of blood clot, heart attack, pneumonia, death. Also reviewed risks of mesh erosion or infection. All her questions were answered and she verbalized understanding.    2. OAB - We discussed the symptoms of overactive bladder (OAB), which include urinary urgency, urinary frequency, nocturia, with or without urge incontinence.  While we do not know the exact etiology of OAB, several treatment options exist. We discussed management including behavioral therapy (decreasing bladder irritants, urge suppression strategies, timed voids, bladder retraining), physical therapy, medication; for refractory cases posterior tibial nerve stimulation, sacral neuromodulation, and intravesical botulinum toxin injection.  -Prescribed Gemtesa 75mg  daily. Will follow up on improvement at pre op visit.   3. Stage II anterior, Stage 0 posterior, Stage 0 apical prolapse - Isolated asymptomatic anterior vaginal wall prolapse. We discussed that would recommend correcting for proper anatomical sling placement with an anterior repair. Reviewed 70-80% success rate of prolapse  repair.  For treatment of pelvic organ prolapse, we discussed options for management including expectant management, conservative management, and surgical management, such as Kegels, a pessary, pelvic floor physical therapy, and specific surgical procedures.  Request sent for surgery scheduling. Will return for pre op visit.   Marguerita Beards, MD

## 2023-02-11 ENCOUNTER — Encounter: Payer: Self-pay | Admitting: Obstetrics and Gynecology

## 2023-02-11 DIAGNOSIS — N3281 Overactive bladder: Secondary | ICD-10-CM

## 2023-02-11 MED ORDER — TROSPIUM CHLORIDE ER 60 MG PO CP24: 1.0000 | ORAL_CAPSULE | Freq: Every day | ORAL | 5 refills | Status: DC

## 2023-02-12 MED ORDER — TROSPIUM CHLORIDE ER 60 MG PO CP24: 60.0000 mg | ORAL_CAPSULE | Freq: Every day | ORAL | 5 refills | Status: DC

## 2023-02-12 NOTE — Addendum Note (Signed)
Addended by: Selmer Dominion on: 02/12/2023 08:11 AM   Modules accepted: Orders

## 2023-02-16 ENCOUNTER — Other Ambulatory Visit: Payer: Self-pay | Admitting: Family Medicine

## 2023-02-16 DIAGNOSIS — E7800 Pure hypercholesterolemia, unspecified: Secondary | ICD-10-CM

## 2023-02-20 ENCOUNTER — Ambulatory Visit
Admission: RE | Admit: 2023-02-20 | Discharge: 2023-02-20 | Disposition: A | Discharge status: Home / Self Care | Payer: PRIVATE HEALTH INSURANCE | Source: Ambulatory Visit | Attending: Family Medicine | Admitting: Family Medicine

## 2023-02-20 DIAGNOSIS — N6321 Unspecified lump in the left breast, upper outer quadrant: Secondary | ICD-10-CM | POA: Insufficient documentation

## 2023-03-11 ENCOUNTER — Telehealth (HOSPITAL_BASED_OUTPATIENT_CLINIC_OR_DEPARTMENT_OTHER): Payer: Self-pay | Admitting: *Deleted

## 2023-03-11 NOTE — Telephone Encounter (Signed)
Called pt to discuss scheduling surgery. Pt states that she does not want to proceed with surgery at this time. Message sent to provider.

## 2023-05-04 ENCOUNTER — Other Ambulatory Visit: Payer: Self-pay | Admitting: Family Medicine

## 2023-05-04 DIAGNOSIS — F331 Major depressive disorder, recurrent, moderate: Secondary | ICD-10-CM

## 2023-05-25 ENCOUNTER — Other Ambulatory Visit: Payer: Self-pay | Admitting: Family Medicine

## 2023-05-28 NOTE — Telephone Encounter (Signed)
Requested medications are due for refill today.  yes  Requested medications are on the active medications list.  yes  Last refill. 06/14/2022 #90 3 rf  Future visit scheduled.   no  Notes to clinic.  Angelica Medina listed as pcp.    Requested Prescriptions  Pending Prescriptions Disp Refills   DULoxetine (CYMBALTA) 60 MG capsule [Pharmacy Med Name: Duloxetine 60mg  Delayed-Release Capsule] 90 capsule 2    Sig: Take 1 capsule by mouth daily.     Psychiatry: Antidepressants - SNRI - duloxetine Failed - 05/25/2023  3:29 PM      Failed - Last BP in normal range    BP Readings from Last 1 Encounters:  02/06/23 (!) 154/82         Failed - Valid encounter within last 6 months    Recent Outpatient Visits           11 months ago Hypertension associated with diabetes [facility])   [place] [facility] [place], Marzella Schlein, MD   1 year ago Encounter for annual health examination   [place] [facility] [place], Marzella Schlein, MD   1 year ago MDD (major depressive disorder), recurrent severe, without psychosis [facility])   [place] [facility] [place], Marzella Schlein, MD   1 year ago Achilles tendinitis of right lower extremity   [place] [facility] Malva Limes, MD   1 year ago Type 2 diabetes mellitus with diabetic polyneuropathy, without long-term current use of insulin [facility])   [place] [facility] [place], Marzella Schlein, MD              Passed - Cr in normal range and within 360 days    Creatinine, Ser  Date Value Ref Range Status  06/14/2022 0.67 0.57 - 1.00 mg/dL Final         Passed - eGFR is 30 or above and within 360 days    GFR calc Af Amer  Date Value Ref Range Status  01/06/2020 103  Final   GFR calc non Af Amer  Date Value Ref Range Status  01/16/2021 99  Final   eGFR  Date Value Ref Range Status  06/14/2022 99 >59 mL/min/1.73 Final         Passed - Completed PHQ-2 or PHQ-9 in the  last 360 days

## 2023-07-14 ENCOUNTER — Other Ambulatory Visit: Payer: Self-pay | Admitting: Family Medicine

## 2023-07-16 NOTE — Telephone Encounter (Signed)
 Patient no longer under prescriber's care, Please send request to Oneil Pinal, MD at [facility]. Requested Prescriptions  Pending Prescriptions Disp Refills   metFORMIN  (GLUCOPHAGE ) 500 MG tablet [Pharmacy Med Name: Metformin  Hydrochloride 500mg  Tablet] 180 tablet 0    Sig: Take 1 tablet by mouth twice daily with meals.     Endocrinology:  Diabetes - Biguanides Failed - 07/16/2023 11:59 AM      Failed - Cr in normal range and within 360 days    Creatinine, Ser  Date Value Ref Range Status  06/14/2022 0.67 0.57 - 1.00 mg/dL Final         Failed - HBA1C is between 0 and 7.9 and within 180 days    Hemoglobin A1C  Date Value Ref Range Status  06/14/2022 6.7 (A) 4.0 - 5.6 % Final  01/16/2021 6.3  Final   Hgb A1c MFr Bld  Date Value Ref Range Status  12/11/2021 6.4 (H) 4.8 - 5.6 % Final    Comment:             Prediabetes: 5.7 - 6.4          Diabetes: >6.4          Glycemic control for adults with diabetes: <7.0          Failed - eGFR in normal range and within 360 days    GFR calc Af Amer  Date Value Ref Range Status  01/06/2020 103  Final   GFR calc non Af Amer  Date Value Ref Range Status  01/16/2021 99  Final   eGFR  Date Value Ref Range Status  06/14/2022 99 >59 mL/min/1.73 Final         Failed - B12 Level in normal range and within 720 days    Vitamin B-12  Date Value Ref Range Status  01/23/2019 776 232 - 1,245 pg/mL Final         Failed - Valid encounter within last 6 months    Recent Outpatient Visits           1 year ago Hypertension associated with diabetes [facility])   [place] [facility] [place], Jon HERO, MD   1 year ago Encounter for annual health examination   [place] [facility] [place] [place], Jon HERO, MD   1 year ago MDD (major depressive disorder), recurrent severe, without psychosis (HCC)   [place] [facility] [place], Jon HERO, MD   1 year ago Achilles tendinitis of right lower  extremity   [place] [facility] [place] Nancyann BRAVO, MD   2 years ago Type 2 diabetes mellitus with diabetic polyneuropathy, without long-term current use of insulin [facility])   [place] [facility] [place], Jon HERO, MD              Failed - CBC within normal limits and completed in the last 12 months    WBC  Date Value Ref Range Status  01/23/2019 8.1 3.4 - 10.8 x10E3/uL Final   RBC  Date Value Ref Range Status  01/23/2019 4.55 3.77 - 5.28 x10E6/uL Final   Hemoglobin  Date Value Ref Range Status  01/23/2019 12.7 11.1 - 15.9 g/dL Final   Hematocrit  Date Value Ref Range Status  01/23/2019 40.1 34.0 - 46.6 % Final   MCHC  Date Value Ref Range Status  01/23/2019 31.7 31.5 - 35.7 g/dL Final   [facility] [place]  Date Value Ref Range Status  01/23/2019 27.9 26.6 - 33.0 pg  Final   MCV  Date Value Ref Range Status  01/23/2019 88 79 - 97 fL Final   No results found for: PLTCOUNTKUC, LABPLAT, POCPLA RDW  Date Value Ref Range Status  01/23/2019 13.2 11.7 - 15.4 % Final

## 2023-10-21 ENCOUNTER — Telehealth: Payer: Self-pay

## 2023-10-21 NOTE — Telephone Encounter (Signed)
 Its documented in the chart that pt was called to schedule surgery and she did not want to proceed. She will need another appt to discuss prior to scheduling.

## 2023-10-21 NOTE — Telephone Encounter (Signed)
 Patient called stating she was suppose to have surgery in October but was not schedule. Patient is currently interest in surgery and would like for a Pre-Op appointment and surgery scheduling.   Please follow up with the patient.  Thank you

## 2023-10-21 NOTE — Telephone Encounter (Signed)
 Patient is scheduled to see you on April 29 th at 9:20 am.

## 2023-10-21 NOTE — Telephone Encounter (Signed)
 Patient is scheduled to come see you April 29 th at 9:20 am.

## 2023-10-29 ENCOUNTER — Encounter: Payer: Self-pay | Admitting: Obstetrics and Gynecology

## 2023-10-29 ENCOUNTER — Ambulatory Visit (INDEPENDENT_AMBULATORY_CARE_PROVIDER_SITE_OTHER): Payer: PRIVATE HEALTH INSURANCE | Admitting: Obstetrics and Gynecology

## 2023-10-29 VITALS — BP 130/81 | HR 58 | Wt 217.0 lb

## 2023-10-29 DIAGNOSIS — N8110 Cystocele, unspecified: Secondary | ICD-10-CM

## 2023-10-29 DIAGNOSIS — N3281 Overactive bladder: Secondary | ICD-10-CM

## 2023-10-29 DIAGNOSIS — N393 Stress incontinence (female) (male): Secondary | ICD-10-CM

## 2023-10-29 MED ORDER — MIRABEGRON ER 25 MG PO TB24
25.0000 mg | ORAL_TABLET | Freq: Every day | ORAL | 5 refills | Status: AC
Start: 2023-10-29 — End: ?

## 2023-10-29 NOTE — Progress Notes (Signed)
 [facility] Return Visit  SUBJECTIVE  History of Present Illness: Tamilyn Oberbroeckling is a 63 y.o. female seen in follow-up for prolapse and mixed incontinence. Previously was planning sling and anterior repair in the fall but wanted to delay surgery.   Symptoms have been unchanged. She is urinating about every hour and has a hard time making it to the bathroom. She does leak. She is going through about 2 pads. Also leaking with cough and sneeze as well. Gemtesa  was too expensive. Tried trospium  for short time and did not see improvement. She is taking an OTC supplement "bladder buster."  She was previously noted to have anterior vaginal wall prolapse but she is asymptomatic.    Past Medical History: Patient  has a past medical history of Anxiety, Depression, Hypertension (2016), Sleep apnea, and T2DM (type 2 diabetes mellitus) (HCC) (2016).   Past Surgical History: She  has a past surgical history that includes Back surgery (1996); Ectopic pregnancy surgery; Esophageal dilation (2014); Breast biopsy (Left, 2009); Open reduction internal fixation (orif) distal radial fracture (Left, 09/17/2017); Colonoscopy with propofol  (N/A, 09/26/2020); and Knee surgery.   Medications: She has a current medication list which includes the following prescription(s): b complex vitamins, duloxetine , fluticasone , mirabegron er, multivitamin, omega-3, and trazodone .   Allergies: Patient is allergic to bupropion and oxycodone .   Social History: Patient  reports that she quit smoking about 29 years ago. Her smoking use included cigarettes. She started smoking about 49 years ago. She has a 10 pack-year smoking history. She has never used smokeless tobacco. She reports that she does not drink alcohol and does not use drugs.     OBJECTIVE     Physical Exam: Vitals:   10/29/23 0918  BP: 130/81  Pulse: (!) 58  Weight: 217 lb (98.4 kg)   Gen: No apparent distress, A&O x 3.  Detailed  Urogynecologic Evaluation:   + CST. Normal external genitalia. On speculum, normal vaginal mucosa and normal appearing cervix. On bimanual, uterus is small, mobile and nontender.   POP-Q  -1                                            Aa   -1                                           Ba  -8.5                                              C   3                                            Gh  5                                            Pb  9  tvl   -2                                            Ap  -2                                            Bp  -8.5                                              D       ASSESSMENT AND PLAN    Ms. Repa is a 63 y.o. with:  1. Overactive bladder    - For treatment of stress urinary incontinence,  non-surgical options include expectant management, weight loss, physical therapy, as well as a pessary.  Surgical options include a midurethral sling, Burch urethropexy, and transurethral injection of a bulking agent. - She is interested in a sling. Also recommend concurrent anterior repair due to cystocele.  - For OAB, she is interested in starting a medication. Will prescribed myrbetriq 25mg  daily.   Plan for surgery: Exam under anesthesia, anterior repair, midurethral sling, cystoscopy  - We reviewed the patient's specific anatomic and functional findings, with the assistance of diagrams, and together finalized the above procedure. The planned surgical procedures were discussed along with the surgical risks outlined below, which were also provided on a detailed handout. Additional treatment options including expectant management, conservative management, medical management were discussed where appropriate.  We reviewed the benefits and risks of each treatment option.   General Surgical Risks: For all procedures, there are risks of bleeding, infection, damage to surrounding organs including but not limited to  bowel, bladder, blood vessels, ureters and nerves, and need for further surgery if an injury were to occur. These risks are all low with minimally invasive surgery.   There are risks of numbness and weakness at any body site or buttock/rectal pain.  It is possible that baseline pain can be worsened by surgery, either with or without mesh. If surgery is vaginal, there is also a low risk of possible conversion to laparoscopy or open abdominal incision where indicated. Very rare risks include blood transfusion, blood clot, heart attack, pneumonia, or death.   There is also a risk of short-term postoperative urinary retention with need to use a catheter. About half of patients need to go home from surgery with a catheter, which is then later removed in the office. The risk of long-term need for a catheter is very low. There is also a risk of worsening of overactive bladder.   Sling: The effectiveness of a midurethral vaginal mesh sling is approximately 85%, and thus, there will be times when you may leak urine after surgery, especially if your bladder is full or if you have a strong cough. There is a balance between making the sling tight enough to treat your leakage but not too tight so that you have long-term difficulty emptying your bladder. A mesh sling will not directly treat overactive bladder/urge incontinence and may worsen it.  There is an FDA safety notification on vaginal mesh procedures for prolapse but NOT mesh slings. We have extensive experience and training  with mesh placement and we have close postoperative follow up to identify any potential complications from mesh. It is important to realize that this mesh is a permanent implant that cannot be easily removed. There are rare risks of mesh exposure (2-4%), pain with intercourse (0-7%), and infection (<1%). The risk of mesh exposure if more likely in a woman with risks for poor healing (prior radiation, poorly controlled diabetes, or  immunocompromised). The risk of new or worsened chronic pain after mesh implant is more common in women with baseline chronic pain and/or poorly controlled anxiety or depression. Approximately 2-4% of patients will experience longer-term post-operative voiding dysfunction that may require surgical revision of the sling. We also reviewed that postoperatively, her stream may not be as strong as before surgery.   Prolapse (with or without mesh): Risk factors for surgical failure  include things that put pressure on your pelvis and the surgical repair, including obesity, chronic cough, and heavy lifting or straining (including lifting children or adults, straining on the toilet, or lifting heavy objects such as furniture or anything weighing >25 lbs. Risks of recurrence is 20-30% with vaginal native tissue repair and a less than 10% with sacrocolpopexy with mesh.    - For preop Visit:  She is required to have a visit within 30 days of her surgery.    - Medical clearance: not required but needs updated A1c prior to surgery (most recent 6.6% on 07/02/23) - Anticoagulant use: No - Medicaid Hysterectomy form: n/a - Accepts blood transfusion: Yes - Expected length of stay: outpatient  Request sent for surgery scheduling.   Arma Lamp, MD

## 2023-11-07 NOTE — Telephone Encounter (Signed)
 Awaiting response

## 2024-01-09 DIAGNOSIS — M170 Bilateral primary osteoarthritis of knee: Secondary | ICD-10-CM | POA: Insufficient documentation

## 2024-01-20 ENCOUNTER — Other Ambulatory Visit: Payer: Self-pay | Admitting: Internal Medicine

## 2024-01-20 DIAGNOSIS — Z1231 Encounter for screening mammogram for malignant neoplasm of breast: Secondary | ICD-10-CM

## 2024-01-22 ENCOUNTER — Ambulatory Visit (INDEPENDENT_AMBULATORY_CARE_PROVIDER_SITE_OTHER): Payer: PRIVATE HEALTH INSURANCE | Admitting: Podiatry

## 2024-01-22 ENCOUNTER — Ambulatory Visit (INDEPENDENT_AMBULATORY_CARE_PROVIDER_SITE_OTHER): Payer: PRIVATE HEALTH INSURANCE

## 2024-01-22 ENCOUNTER — Encounter: Payer: Self-pay | Admitting: Podiatry

## 2024-01-22 DIAGNOSIS — M7752 Other enthesopathy of left foot: Secondary | ICD-10-CM

## 2024-01-22 DIAGNOSIS — M7661 Achilles tendinitis, right leg: Secondary | ICD-10-CM

## 2024-01-22 DIAGNOSIS — M205X1 Other deformities of toe(s) (acquired), right foot: Secondary | ICD-10-CM

## 2024-01-22 DIAGNOSIS — M205X2 Other deformities of toe(s) (acquired), left foot: Secondary | ICD-10-CM | POA: Diagnosis not present

## 2024-01-22 MED ORDER — DEXAMETHASONE SODIUM PHOSPHATE 120 MG/30ML IJ SOLN
4.0000 mg | Freq: Once | INTRAMUSCULAR | Status: AC
  Administered 2024-01-22: 4 mg via INTRA_ARTICULAR

## 2024-01-22 NOTE — Progress Notes (Signed)
 Subjective:  Patient ID: Angelica Medina, female    DOB: 1960-08-11,  MRN: [id number] HPI Chief Complaint  Patient presents with   Foot Pain    5th MPJ left - tender with walking and shoes x months  Posterior heel right - Dx with achilles tendonitis, flares up occasionally, NSAIDS raises BP so tries to take herbal supplements to reduce inflammation   New Patient (Initial Visit)    Est pt 03/2021    63 y.o. female presents with the above complaint.   ROS: Denies fever chills nausea vomiting muscle aches pains calf pain back pain chest pain shortness of breath.  Past Medical History:  Diagnosis Date   Anxiety    Depression    Hypertension 2016   Sleep apnea    T2DM (type 2 diabetes mellitus) (HCC) 2016   diet controlled   Past Surgical History:  Procedure Laterality Date   BACK SURGERY  1996   for herniated disc, no hardware   BREAST BIOPSY Left 2009   benign   COLONOSCOPY WITH PROPOFOL  N/A 09/26/2020   Procedure: COLONOSCOPY WITH PROPOFOL ;  Surgeon: Therisa Bi, MD;  Location: [facility];  Service: Gastroenterology;  Laterality: N/A;   ECTOPIC PREGNANCY SURGERY     Had unilateral salpingectomy with pfannenstiel incision   ESOPHAGEAL DILATION  2014   has had several i nthe past for achalasia   KNEE SURGERY     OPEN REDUCTION INTERNAL FIXATION (ORIF) DISTAL RADIAL FRACTURE Left 09/17/2017   Procedure: OPEN REDUCTION INTERNAL FIXATION (ORIF) DISTAL RADIAL FRACTURE;  Surgeon: Leora Lynwood SAUNDERS, MD;  Location: [facility] ORS;  Service: Orthopedics;  Laterality: Left;    Current Outpatient Medications:    clobetasol ointment (TEMOVATE) 0.05 %, Apply topically., Disp: , Rfl:    b complex vitamins capsule, Take 1 capsule by mouth daily., Disp: , Rfl:    DULoxetine  (CYMBALTA ) 60 MG capsule, Take 1 capsule (60 mg total) by mouth daily., Disp: 90 capsule, Rfl: 3   fluticasone  (FLONASE ) 50 MCG/ACT nasal spray, Place 2 sprays into both nostrils daily., Disp: 48 g, Rfl: 3   mirabegron  ER  (MYRBETRIQ ) 25 MG TB24 tablet, Take 1 tablet (25 mg total) by mouth daily., Disp: 30 tablet, Rfl: 5   Multiple Vitamin (MULTIVITAMIN) tablet, Take 1 tablet by mouth daily., Disp: , Rfl:    Omega-3 1000 MG CAPS, Take 1 tablet by mouth daily., Disp: , Rfl:    traZODone  (DESYREL ) 50 MG tablet, Take 2-3 tablets (100-150 mg total) by mouth at bedtime as needed for sleep., Disp: 270 tablet, Rfl: 3  Allergies  Allergen Reactions   Bupropion Anxiety and Other (See Comments)   Oxycodone  Itching   Review of Systems Objective:  There were no vitals filed for this visit.  General: Well developed, nourished, in no acute distress, alert and oriented x3   Dermatological: Skin is warm, dry and supple bilateral. Nails x 10 are well maintained; remaining integument appears unremarkable at this time. There are no open sores, no preulcerative lesions, no rash or signs of infection present.  Overlying benign skin lesion beneath the fifth metatarsal head.  Vascular: Dorsalis Pedis artery and Posterior Tibial artery pedal pulses are 2/4 bilateral with immedate capillary fill time. Pedal hair growth present. No varicosities and no lower extremity edema present bilateral.   Neruologic: Grossly intact via light touch bilateral. Vibratory intact via tuning fork bilateral. Protective threshold with Semmes Wienstein monofilament intact to all pedal sites bilateral. Patellar and Achilles deep tendon reflexes 2+ bilateral. No Babinski  or clonus noted bilateral.   Musculoskeletal: No gross boney pedal deformities bilateral. No pain, crepitus, or limitation noted with foot and ankle range of motion bilateral. Muscular strength 5/5 in all groups tested bilateral.  Pain on palpation with fluctuance overlying the Achilles just at the superior margin of the calcaneus.  Mild flexible mallet toe deformities second bilateral.  Palpable bursa beneath the fifth metatarsal head of the left foot.  Gait: Unassisted, Nonantalgic.     Radiographs:  Radiographs taken today demonstrate osseously mature individual large retrocalcaneal heel spurs soft tissue increase in density to the Achilles with areas of ossification in the distal Achilles right heel.  Assessment & Plan:   Assessment: Pre-Achilles bursitis right with large retrocalcaneal heel spur.  Mallet toe deformity second digit bilateral  Benign skin lesion fifth metatarsal head left foot.  Bursitis subfifth met head left.  Plan: I injected the bursa today bilaterally with 4 mg of dexamethasone  and local anesthetic.  Debrided benign skin lesion.  Discussed the need for flexor tenotomy's second digits bilateral.  Did discuss briefly the need for surgical intervention regarding the right Achilles.       Angelica Bergman T. [place], [place]

## 2024-02-26 ENCOUNTER — Other Ambulatory Visit: Payer: Self-pay | Admitting: Medical Genetics

## 2024-03-04 ENCOUNTER — Ambulatory Visit
Admission: RE | Admit: 2024-03-04 | Discharge: 2024-03-04 | Disposition: A | Discharge status: Home / Self Care | Payer: PRIVATE HEALTH INSURANCE | Source: Ambulatory Visit | Attending: Internal Medicine | Admitting: Internal Medicine

## 2024-03-04 ENCOUNTER — Other Ambulatory Visit
Admission: RE | Admit: 2024-03-04 | Discharge: 2024-03-04 | Disposition: A | Discharge status: Home / Self Care | Payer: PRIVATE HEALTH INSURANCE | Source: Ambulatory Visit | Attending: Medical Genetics | Admitting: Medical Genetics

## 2024-03-04 DIAGNOSIS — Z1231 Encounter for screening mammogram for malignant neoplasm of breast: Secondary | ICD-10-CM | POA: Diagnosis present

## 2024-03-12 ENCOUNTER — Other Ambulatory Visit: Payer: Self-pay | Admitting: Family Medicine

## 2024-03-12 DIAGNOSIS — R928 Other abnormal and inconclusive findings on diagnostic imaging of breast: Secondary | ICD-10-CM

## 2024-03-13 LAB — GENECONNECT MOLECULAR SCREEN: Genetic Analysis Overall Interpretation: NEGATIVE

## 2024-03-16 ENCOUNTER — Ambulatory Visit
Admission: RE | Admit: 2024-03-16 | Discharge: 2024-03-16 | Disposition: A | Discharge status: Home / Self Care | Payer: PRIVATE HEALTH INSURANCE | Source: Ambulatory Visit | Attending: Family Medicine | Admitting: Family Medicine

## 2024-03-16 ENCOUNTER — Other Ambulatory Visit: Payer: Self-pay | Admitting: Family Medicine

## 2024-03-16 DIAGNOSIS — R928 Other abnormal and inconclusive findings on diagnostic imaging of breast: Secondary | ICD-10-CM | POA: Diagnosis present

## 2024-03-18 ENCOUNTER — Ambulatory Visit: Payer: PRIVATE HEALTH INSURANCE | Admitting: Podiatry

## 2024-04-15 ENCOUNTER — Ambulatory Visit: Payer: PRIVATE HEALTH INSURANCE | Admitting: Podiatry

## 2024-04-22 ENCOUNTER — Ambulatory Visit: Payer: PRIVATE HEALTH INSURANCE | Admitting: Podiatry

## 2024-04-29 ENCOUNTER — Ambulatory Visit: Payer: PRIVATE HEALTH INSURANCE | Admitting: Podiatry

## 2024-05-04 ENCOUNTER — Ambulatory Visit: Payer: PRIVATE HEALTH INSURANCE | Admitting: Podiatry

## 2024-07-13 ENCOUNTER — Encounter: Payer: Self-pay | Admitting: *Deleted
